# Patient Record
Sex: Female | Born: 1950
Health system: Southern US, Community
[De-identification: ages and names within clinical notes are randomized; demographics above are authoritative.]

## PROBLEM LIST (undated history)

## (undated) DIAGNOSIS — E079 Disorder of thyroid, unspecified: Secondary | ICD-10-CM

## (undated) DIAGNOSIS — I10 Essential (primary) hypertension: Secondary | ICD-10-CM

## (undated) DIAGNOSIS — R7303 Prediabetes: Secondary | ICD-10-CM

## (undated) DIAGNOSIS — R011 Cardiac murmur, unspecified: Secondary | ICD-10-CM

## (undated) DIAGNOSIS — M810 Age-related osteoporosis without current pathological fracture: Secondary | ICD-10-CM

## (undated) HISTORY — DX: Prediabetes: R73.03

## (undated) HISTORY — DX: Cardiac murmur, unspecified: R01.1

## (undated) HISTORY — PX: SMALL INTESTINE SURGERY: SHX150

## (undated) HISTORY — DX: Age-related osteoporosis without current pathological fracture: M81.0

## (undated) HISTORY — DX: Disorder of thyroid, unspecified: E07.9

---

## 1998-06-21 ENCOUNTER — Other Ambulatory Visit: Admission: RE | Admit: 1998-06-21 | Discharge: 1998-06-21 | Payer: Self-pay | Admitting: Obstetrics & Gynecology

## 1998-12-09 ENCOUNTER — Other Ambulatory Visit: Admission: RE | Admit: 1998-12-09 | Discharge: 1998-12-09 | Payer: Self-pay | Admitting: Obstetrics and Gynecology

## 2000-01-18 ENCOUNTER — Other Ambulatory Visit: Admission: RE | Admit: 2000-01-18 | Discharge: 2000-01-18 | Payer: Self-pay | Admitting: Obstetrics and Gynecology

## 2014-07-22 DIAGNOSIS — Z91199 Patient's noncompliance with other medical treatment and regimen due to unspecified reason: Secondary | ICD-10-CM | POA: Insufficient documentation

## 2017-05-17 DIAGNOSIS — Z6841 Body Mass Index (BMI) 40.0 and over, adult: Secondary | ICD-10-CM

## 2017-05-17 DIAGNOSIS — K8050 Calculus of bile duct without cholangitis or cholecystitis without obstruction: Principal | ICD-10-CM | POA: Diagnosis present

## 2017-05-17 DIAGNOSIS — R1011 Right upper quadrant pain: Secondary | ICD-10-CM | POA: Diagnosis not present

## 2017-05-17 DIAGNOSIS — Z7982 Long term (current) use of aspirin: Secondary | ICD-10-CM

## 2017-05-17 DIAGNOSIS — Z880 Allergy status to penicillin: Secondary | ICD-10-CM

## 2017-05-17 DIAGNOSIS — E785 Hyperlipidemia, unspecified: Secondary | ICD-10-CM | POA: Diagnosis present

## 2017-05-17 DIAGNOSIS — I10 Essential (primary) hypertension: Secondary | ICD-10-CM | POA: Diagnosis present

## 2017-05-18 ENCOUNTER — Observation Stay (HOSPITAL_COMMUNITY): Payer: Medicare Other | Admitting: Anesthesiology

## 2017-05-18 ENCOUNTER — Inpatient Hospital Stay (HOSPITAL_COMMUNITY)
Admission: EM | Admit: 2017-05-18 | Discharge: 2017-05-22 | DRG: 418 | Disposition: A | Discharge status: Home / Self Care | Payer: Medicare Other | Attending: General Surgery | Admitting: General Surgery

## 2017-05-18 ENCOUNTER — Encounter (HOSPITAL_COMMUNITY): Admission: EM | Disposition: A | Discharge status: Home / Self Care | Payer: Self-pay | Source: Home / Self Care

## 2017-05-18 ENCOUNTER — Other Ambulatory Visit: Payer: Self-pay

## 2017-05-18 ENCOUNTER — Encounter (HOSPITAL_COMMUNITY): Payer: Self-pay | Admitting: Emergency Medicine

## 2017-05-18 ENCOUNTER — Emergency Department (HOSPITAL_COMMUNITY): Payer: Medicare Other

## 2017-05-18 ENCOUNTER — Observation Stay (HOSPITAL_COMMUNITY): Payer: Medicare Other

## 2017-05-18 DIAGNOSIS — E782 Mixed hyperlipidemia: Secondary | ICD-10-CM | POA: Diagnosis present

## 2017-05-18 DIAGNOSIS — K8020 Calculus of gallbladder without cholecystitis without obstruction: Secondary | ICD-10-CM | POA: Diagnosis present

## 2017-05-18 DIAGNOSIS — K8050 Calculus of bile duct without cholangitis or cholecystitis without obstruction: Secondary | ICD-10-CM

## 2017-05-18 DIAGNOSIS — R1011 Right upper quadrant pain: Secondary | ICD-10-CM

## 2017-05-18 DIAGNOSIS — I10 Essential (primary) hypertension: Secondary | ICD-10-CM | POA: Diagnosis present

## 2017-05-18 DIAGNOSIS — E785 Hyperlipidemia, unspecified: Secondary | ICD-10-CM | POA: Diagnosis present

## 2017-05-18 DIAGNOSIS — K8051 Calculus of bile duct without cholangitis or cholecystitis with obstruction: Secondary | ICD-10-CM

## 2017-05-18 HISTORY — DX: Essential (primary) hypertension: I10

## 2017-05-18 HISTORY — PX: CHOLECYSTECTOMY: SHX55

## 2017-05-18 LAB — TROPONIN I: Troponin I: <0.03 ng/mL (ref <0.03)

## 2017-05-18 LAB — BASIC METABOLIC PANEL
Anion gap: 11 (ref 5–15)
BUN: 12 mg/dL (ref 6–20)
CO2: 21 mmol/L — ABNORMAL LOW (ref 22–32)
CREATININE: 0.78 mg/dL (ref 0.44–1.00)
Calcium: 9.4 mg/dL (ref 8.9–10.3)
Chloride: 103 mmol/L (ref 101–111)
GFR calc Af Amer: >60 mL/min (ref >60)
Glucose, Bld: 120 mg/dL — ABNORMAL HIGH (ref 65–99)
POTASSIUM: 4.3 mmol/L (ref 3.5–5.1)
SODIUM: 135 mmol/L (ref 135–145)

## 2017-05-18 LAB — CBC WITH DIFFERENTIAL/PLATELET
Basophils Absolute: 0 10*3/uL (ref 0.0–0.1)
Basophils Relative: 0 %
EOS ABS: 0 10*3/uL (ref 0.0–0.7)
EOS PCT: 0 %
HCT: 42.6 % (ref 36.0–46.0)
Hemoglobin: 14.7 g/dL (ref 12.0–15.0)
LYMPHS ABS: 1.7 10*3/uL (ref 0.7–4.0)
LYMPHS PCT: 15 %
MCH: 29.5 pg (ref 26.0–34.0)
MCHC: 34.5 g/dL (ref 30.0–36.0)
MCV: 85.5 fL (ref 78.0–100.0)
MONO ABS: 0.7 10*3/uL (ref 0.1–1.0)
MONOS PCT: 6 %
Neutro Abs: 9.1 10*3/uL — ABNORMAL HIGH (ref 1.7–7.7)
Neutrophils Relative %: 79 %
PLATELETS: 378 10*3/uL (ref 150–400)
RBC: 4.98 MIL/uL (ref 3.87–5.11)
RDW: 12.9 % (ref 11.5–15.5)
WBC: 11.5 10*3/uL — AB (ref 4.0–10.5)

## 2017-05-18 LAB — LIPASE, BLOOD: LIPASE: 26 U/L (ref 11–51)

## 2017-05-18 LAB — HEPATIC FUNCTION PANEL
ALT: 19 U/L (ref 14–54)
AST: 19 U/L (ref 15–41)
Albumin: 4.1 g/dL (ref 3.5–5.0)
Alkaline Phosphatase: 72 U/L (ref 38–126)
BILIRUBIN DIRECT: 0.2 mg/dL (ref 0.1–0.5)
BILIRUBIN TOTAL: 2 mg/dL — AB (ref 0.3–1.2)
Indirect Bilirubin: 1.8 mg/dL — ABNORMAL HIGH (ref 0.3–0.9)
Total Protein: 7.9 g/dL (ref 6.5–8.1)

## 2017-05-18 LAB — ETHANOL

## 2017-05-18 SURGERY — LAPAROSCOPIC CHOLECYSTECTOMY WITH INTRAOPERATIVE CHOLANGIOGRAM
Anesthesia: General | Site: Abdomen

## 2017-05-18 MED ORDER — EPHEDRINE 5 MG/ML INJ
INTRAVENOUS | Status: AC
  Filled 2017-05-18: qty 10

## 2017-05-18 MED ORDER — SPIRONOLACTONE 25 MG PO TABS: 25.0000 mg | ORAL_TABLET | Freq: Every day | ORAL | Status: DC

## 2017-05-18 MED ORDER — ONDANSETRON HCL 4 MG/2ML IJ SOLN
4.0000 mg | Freq: Four times a day (QID) | INTRAMUSCULAR | Status: DC | PRN
  Administered 2017-05-18: 4 mg via INTRAVENOUS

## 2017-05-18 MED ORDER — FENTANYL CITRATE (PF) 100 MCG/2ML IJ SOLN
50.0000 ug | Freq: Once | INTRAMUSCULAR | Status: AC
  Administered 2017-05-18: 50 ug via INTRAVENOUS
  Filled 2017-05-18: qty 2

## 2017-05-18 MED ORDER — ONDANSETRON HCL 4 MG/2ML IJ SOLN
2.0000 mg | INTRAMUSCULAR | Status: DC | PRN
  Administered 2017-05-18 – 2017-05-22 (×11): 4 mg via INTRAVENOUS
  Filled 2017-05-18 (×11): qty 2

## 2017-05-18 MED ORDER — DEXAMETHASONE SODIUM PHOSPHATE 10 MG/ML IJ SOLN
INTRAMUSCULAR | Status: DC | PRN
  Administered 2017-05-18: 10 mg via INTRAVENOUS

## 2017-05-18 MED ORDER — PROPOFOL 10 MG/ML IV BOLUS
INTRAVENOUS | Status: DC | PRN
  Administered 2017-05-18: 200 mg via INTRAVENOUS

## 2017-05-18 MED ORDER — HEPARIN SODIUM (PORCINE) 5000 UNIT/ML IJ SOLN: 5000.0000 [IU] | Freq: Three times a day (TID) | INTRAMUSCULAR | Status: DC

## 2017-05-18 MED ORDER — HYDROCODONE-ACETAMINOPHEN 5-325 MG PO TABS: 1.0000 | ORAL_TABLET | ORAL | Status: DC | PRN

## 2017-05-18 MED ORDER — BUPIVACAINE-EPINEPHRINE 0.5% -1:200000 IJ SOLN
INTRAMUSCULAR | Status: DC | PRN
  Administered 2017-05-18: 25 mL

## 2017-05-18 MED ORDER — LACTATED RINGERS IV SOLN
INTRAVENOUS | Status: DC | PRN
  Administered 2017-05-18 (×2): via INTRAVENOUS

## 2017-05-18 MED ORDER — ACETAMINOPHEN 325 MG PO TABS
650.0000 mg | ORAL_TABLET | Freq: Four times a day (QID) | ORAL | Status: DC | PRN
  Administered 2017-05-18: 650 mg via ORAL
  Filled 2017-05-18: qty 2

## 2017-05-18 MED ORDER — IOPAMIDOL (ISOVUE-300) INJECTION 61%
INTRAVENOUS | Status: AC
  Filled 2017-05-18: qty 50

## 2017-05-18 MED ORDER — MORPHINE SULFATE (PF) 2 MG/ML IV SOLN: 1.0000 mg | INTRAVENOUS | Status: DC | PRN

## 2017-05-18 MED ORDER — SODIUM CHLORIDE 0.9 % IV SOLN
2.0000 g | INTRAVENOUS | Status: DC
  Administered 2017-05-18: 2 g via INTRAVENOUS

## 2017-05-18 MED ORDER — AMLODIPINE BESYLATE 10 MG PO TABS: 10.0000 mg | ORAL_TABLET | Freq: Every day | ORAL | Status: DC

## 2017-05-18 MED ORDER — FENTANYL CITRATE (PF) 100 MCG/2ML IJ SOLN
INTRAMUSCULAR | Status: AC
  Filled 2017-05-18: qty 2

## 2017-05-18 MED ORDER — METOCLOPRAMIDE HCL 5 MG/ML IJ SOLN: 10.0000 mg | Freq: Once | INTRAMUSCULAR | Status: DC | PRN

## 2017-05-18 MED ORDER — KCL IN DEXTROSE-NACL 20-5-0.9 MEQ/L-%-% IV SOLN
INTRAVENOUS | Status: DC
  Administered 2017-05-18 – 2017-05-20 (×5): via INTRAVENOUS
  Administered 2017-05-21: 1000 mL via INTRAVENOUS
  Administered 2017-05-21 – 2017-05-22 (×2): via INTRAVENOUS
  Filled 2017-05-18 (×8): qty 1000

## 2017-05-18 MED ORDER — LACTATED RINGERS IR SOLN
Status: DC | PRN
  Administered 2017-05-18: 1000 mL

## 2017-05-18 MED ORDER — LIDOCAINE 2% (20 MG/ML) 5 ML SYRINGE
INTRAMUSCULAR | Status: DC | PRN
  Administered 2017-05-18: 80 mg via INTRAVENOUS

## 2017-05-18 MED ORDER — MORPHINE SULFATE (PF) 2 MG/ML IV SOLN
1.0000 mg | INTRAVENOUS | Status: DC | PRN
  Administered 2017-05-18 – 2017-05-19 (×3): 2 mg via INTRAVENOUS
  Administered 2017-05-19: 1 mg via INTRAVENOUS
  Administered 2017-05-19 – 2017-05-20 (×5): 2 mg via INTRAVENOUS
  Filled 2017-05-18 (×9): qty 1

## 2017-05-18 MED ORDER — EPHEDRINE SULFATE-NACL 50-0.9 MG/10ML-% IV SOSY
PREFILLED_SYRINGE | INTRAVENOUS | Status: DC | PRN
  Administered 2017-05-18 (×6): 5 mg via INTRAVENOUS

## 2017-05-18 MED ORDER — MIDAZOLAM HCL 2 MG/2ML IJ SOLN
INTRAMUSCULAR | Status: AC
  Filled 2017-05-18: qty 2

## 2017-05-18 MED ORDER — ONDANSETRON HCL 4 MG/2ML IJ SOLN
4.0000 mg | Freq: Once | INTRAMUSCULAR | Status: AC
  Administered 2017-05-18: 4 mg via INTRAVENOUS
  Filled 2017-05-18: qty 2

## 2017-05-18 MED ORDER — IOPAMIDOL (ISOVUE-300) INJECTION 61%
INTRAVENOUS | Status: DC | PRN
  Administered 2017-05-18: 35 mL via INTRAVENOUS

## 2017-05-18 MED ORDER — SODIUM CHLORIDE 0.9 % IV SOLN
INTRAVENOUS | Status: AC
  Filled 2017-05-18: qty 20

## 2017-05-18 MED ORDER — KCL IN DEXTROSE-NACL 20-5-0.9 MEQ/L-%-% IV SOLN: INTRAVENOUS | Status: DC

## 2017-05-18 MED ORDER — PIPERACILLIN-TAZOBACTAM 3.375 G IVPB 30 MIN
3.3750 g | Freq: Once | INTRAVENOUS | Status: AC
  Administered 2017-05-18: 3.375 g via INTRAVENOUS
  Filled 2017-05-18: qty 50

## 2017-05-18 MED ORDER — LIDOCAINE 2% (20 MG/ML) 5 ML SYRINGE
INTRAMUSCULAR | Status: AC
  Filled 2017-05-18: qty 10

## 2017-05-18 MED ORDER — MEPERIDINE HCL 50 MG/ML IJ SOLN: 6.2500 mg | INTRAMUSCULAR | Status: DC | PRN

## 2017-05-18 MED ORDER — FENTANYL CITRATE (PF) 100 MCG/2ML IJ SOLN: 25.0000 ug | INTRAMUSCULAR | Status: DC | PRN

## 2017-05-18 MED ORDER — SUGAMMADEX SODIUM 200 MG/2ML IV SOLN
INTRAVENOUS | Status: AC
  Filled 2017-05-18: qty 6

## 2017-05-18 MED ORDER — ROCURONIUM BROMIDE 10 MG/ML (PF) SYRINGE
PREFILLED_SYRINGE | INTRAVENOUS | Status: AC
  Filled 2017-05-18: qty 10

## 2017-05-18 MED ORDER — SUGAMMADEX SODIUM 200 MG/2ML IV SOLN
INTRAVENOUS | Status: DC | PRN
  Administered 2017-05-18: 200 mg via INTRAVENOUS

## 2017-05-18 MED ORDER — MIDAZOLAM HCL 5 MG/5ML IJ SOLN
INTRAMUSCULAR | Status: DC | PRN
  Administered 2017-05-18: 2 mg via INTRAVENOUS

## 2017-05-18 MED ORDER — BUPIVACAINE-EPINEPHRINE (PF) 0.5% -1:200000 IJ SOLN
INTRAMUSCULAR | Status: AC
  Filled 2017-05-18: qty 30

## 2017-05-18 MED ORDER — ROCURONIUM BROMIDE 10 MG/ML (PF) SYRINGE
PREFILLED_SYRINGE | INTRAVENOUS | Status: DC | PRN
  Administered 2017-05-18: 50 mg via INTRAVENOUS
  Administered 2017-05-18: 10 mg via INTRAVENOUS

## 2017-05-18 MED ORDER — FAMOTIDINE IN NACL 20-0.9 MG/50ML-% IV SOLN: 20.0000 mg | Freq: Two times a day (BID) | INTRAVENOUS | Status: DC

## 2017-05-18 MED ORDER — FENTANYL CITRATE (PF) 100 MCG/2ML IJ SOLN
INTRAMUSCULAR | Status: DC | PRN
  Administered 2017-05-18 (×4): 50 ug via INTRAVENOUS

## 2017-05-18 MED ORDER — PHENYLEPHRINE 40 MCG/ML (10ML) SYRINGE FOR IV PUSH (FOR BLOOD PRESSURE SUPPORT)
PREFILLED_SYRINGE | INTRAVENOUS | Status: AC
  Filled 2017-05-18: qty 10

## 2017-05-18 MED ORDER — ONDANSETRON 4 MG PO TBDP: 4.0000 mg | ORAL_TABLET | Freq: Four times a day (QID) | ORAL | Status: DC | PRN

## 2017-05-18 SURGICAL SUPPLY — 30 items
ADH SKN CLS APL DERMABOND .7 (GAUZE/BANDAGES/DRESSINGS) ×1
APPLIER CLIP 5 13 M/L LIGAMAX5 (MISCELLANEOUS) ×3
APR CLP MED LRG 5 ANG JAW (MISCELLANEOUS) ×1
BAG SPEC RTRVL LRG 6X4 10 (ENDOMECHANICALS) ×1
CABLE HIGH FREQUENCY MONO STRZ (ELECTRODE) ×3 IMPLANT
CATH REDDICK CHOLANGI 4FR 50CM (CATHETERS) ×3 IMPLANT
CHLORAPREP W/TINT 26ML (MISCELLANEOUS) ×3 IMPLANT
CLIP APPLIE 5 13 M/L LIGAMAX5 (MISCELLANEOUS) ×1 IMPLANT
COVER MAYO STAND STRL (DRAPES) ×3 IMPLANT
DECANTER SPIKE VIAL GLASS SM (MISCELLANEOUS) ×1 IMPLANT
DERMABOND ADVANCED (GAUZE/BANDAGES/DRESSINGS) ×2
DERMABOND ADVANCED .7 DNX12 (GAUZE/BANDAGES/DRESSINGS) ×1 IMPLANT
DRAPE C-ARM 42X120 X-RAY (DRAPES) ×3 IMPLANT
ELECT REM PT RETURN 15FT ADLT (MISCELLANEOUS) ×3 IMPLANT
GLOVE BIO SURGEON STRL SZ7.5 (GLOVE) ×3 IMPLANT
GOWN STRL REUS W/TWL XL LVL3 (GOWN DISPOSABLE) ×7 IMPLANT
HEMOSTAT SURGICEL 4X8 (HEMOSTASIS) IMPLANT
IV CATH 14GX2 1/4 (CATHETERS) ×3 IMPLANT
KIT BASIN OR (CUSTOM PROCEDURE TRAY) ×3 IMPLANT
POUCH SPECIMEN RETRIEVAL 10MM (ENDOMECHANICALS) ×3 IMPLANT
SCISSORS LAP 5X35 DISP (ENDOMECHANICALS) ×3 IMPLANT
SET IRRIG TUBING LAPAROSCOPIC (IRRIGATION / IRRIGATOR) ×3 IMPLANT
SLEEVE XCEL OPT CAN 5 100 (ENDOMECHANICALS) ×6 IMPLANT
SUT MNCRL AB 4-0 PS2 18 (SUTURE) ×3 IMPLANT
TOWEL OR 17X26 10 PK STRL BLUE (TOWEL DISPOSABLE) ×3 IMPLANT
TOWEL OR NON WOVEN STRL DISP B (DISPOSABLE) ×3 IMPLANT
TRAY LAPAROSCOPIC (CUSTOM PROCEDURE TRAY) ×3 IMPLANT
TROCAR BLADELESS OPT 5 100 (ENDOMECHANICALS) ×3 IMPLANT
TROCAR XCEL BLUNT TIP 100MML (ENDOMECHANICALS) ×3 IMPLANT
TUBING INSUF HEATED (TUBING) ×3 IMPLANT

## 2017-05-18 NOTE — ED Provider Notes (Signed)
[facility]-EMERGENCY DEPT Provider Note   CSN: [id number] Arrival date & time: 05/17/17  2358     History   Chief Complaint Chief Complaint  Patient presents with  . Abdominal Pain    HPI Rochele Raringlia Spallone is a 67 y.o. female.  The history is provided by the patient and the spouse.  Abdominal Pain   This is a new problem. The current episode started 12 to 24 hours ago. The problem occurs daily. The problem has been gradually worsening. The pain is associated with eating. The pain is located in the LUQ and RUQ. The pain is severe. Associated symptoms include nausea. Pertinent negatives include diarrhea, hematochezia, melena and vomiting. The symptoms are aggravated by certain positions. Nothing relieves the symptoms.   Patient reports onset of right upper and left upper quadrant abdominal pain in the past 24 hours.  It did seem to be worsened with eating.  No vomiting.  No fever.  No chest pain or shortness of breath.  She has never had this before.  Denies any abdominal surgery previously  PMH - HTN, HLP OB History   None      Home Medications    Prior to Admission medications   Medication Sig Start Date End Date Taking? Authorizing Provider  amLODipine (NORVASC) 10 MG tablet Take 10 mg by mouth daily. 05/10/17  Yes [provider]  aspirin EC 81 MG tablet Take 81 mg by mouth every other day. 08/25/16  Yes [provider]  rosuvastatin (CRESTOR) 10 MG tablet Take 10 mg by mouth daily. 05/10/17  Yes [provider]  spironolactone (ALDACTONE) 25 MG tablet Take 25 mg by mouth daily. 05/10/17  Yes [provider]  Vitamin D, Ergocalciferol, (DRISDOL) 50000 units CAPS capsule Take 500,000 Units by mouth once a week. 05/15/17  Yes [provider]    Family History No family history on file.  Social History Social History   Tobacco Use  . Smoking status: Never Smoker  . Smokeless tobacco: Never Used  Substance Use  Topics  . Alcohol use: Not on file  . Drug use: Not on file     Allergies   Patient has no known allergies.   Review of Systems Review of Systems  Respiratory: Negative for shortness of breath.   Cardiovascular: Negative for chest pain.  Gastrointestinal: Positive for abdominal pain and nausea. Negative for diarrhea, hematochezia, melena and vomiting.  All other systems reviewed and are negative.    Physical Exam Updated Vital Signs BP (!) 168/87 (BP Location: Left Arm)   Pulse 86   Temp 98.4 F (36.9 C) (Oral)   Resp 18   Ht 1.549 m (5\' 1" )   Wt 98.2 kg (216 lb 9.6 oz)   SpO2 98%   BMI 40.93 kg/m   Physical Exam  CONSTITUTIONAL: Well developed/well nourished HEAD: Normocephalic/atraumatic EYES: EOMI/PERRL, no icterus ENMT: Mucous membranes moist NECK: supple no meningeal signs SPINE/BACK:entire spine nontender CV: S1/S2 noted, no murmurs/rubs/gallops noted LUNGS: Lungs are clear to auscultation bilaterally, no apparent distress ABDOMEN: soft, mild tenderness throughout upper abdomen, no rebound or guarding, bowel sounds noted throughout abdomen GU:no cva tenderness NEURO: Pt is awake/alert/appropriate, moves all extremitiesx4.  No facial droop.   EXTREMITIES: pulses normal/equal, full ROM SKIN: warm, color normal PSYCH: no abnormalities of mood noted, alert and oriented to situation  ED Treatments / Results  Labs (all labs ordered are listed, but only abnormal results are displayed) Labs Reviewed  CBC WITH DIFFERENTIAL/PLATELET -  Abnormal; Notable for the following components:      Result Value   WBC 11.5 (*)    Neutro Abs 9.1 (*)    All other components within normal limits  BASIC METABOLIC PANEL - Abnormal; Notable for the following components:   CO2 21 (*)    Glucose, Bld 120 (*)    All other components within normal limits  HEPATIC FUNCTION PANEL - Abnormal; Notable for the following components:   Total Bilirubin 2.0 (*)    Indirect Bilirubin 1.8  (*)    All other components within normal limits  LIPASE, BLOOD  TROPONIN I  ETHANOL    EKG EKG Interpretation  Date/Time:  Friday May 18 2017 02:56:30 EDT Ventricular Rate:  80 PR Interval:    QRS Duration: 82 QT Interval:  355 QTC Calculation: 410 R Axis:   46 Text Interpretation:  Sinus rhythm Baseline wander in lead(s) V1 V2 No previous ECGs available Confirmed by Zadie Rhine ([id number]) on 05/18/2017 3:10:50 AM   Radiology US Abdomen Limited Ruq  Result Date: 05/18/2017 CLINICAL DATA:  Acute onset of right upper quadrant abdominal pain. EXAM: ULTRASOUND ABDOMEN LIMITED RIGHT UPPER QUADRANT COMPARISON:  None. FINDINGS: Gallbladder: Stones are noted dependently within the gallbladder. No significant gallbladder wall thickening is noted. Evaluation for ultrasonographic Murphy's sign is suboptimal given that the patient is on pain medication. No pericholecystic fluid is seen. Common bile duct: Diameter: 1.6 cm, raising concern for distal obstruction. No distal stone is identified on this study. Liver: No focal lesion identified. Diffusely increased parenchymal echogenicity and coarsened echotexture raises question for fatty infiltration. Portal vein is patent on color Doppler imaging with normal direction of blood flow towards the liver. IMPRESSION: 1. Cholelithiasis.  No definite evidence for cholecystitis. 2. Significant dilatation of the common bile duct to 1.6 cm, concerning for distal obstruction. No distal stone is characterized on this study. Would correlate with LFTs. 3. Diffuse fatty infiltration within the liver. Electronically Signed   By: Roanna Raider M.D.   On: 05/18/2017 04:08    Procedures Procedures  Medications Ordered in ED Medications  fentaNYL (SUBLIMAZE) injection 50 mcg (has no administration in time range)  piperacillin-tazobactam (ZOSYN) IVPB 3.375 g (has no administration in time range)  fentaNYL (SUBLIMAZE) injection 50 mcg (50 mcg Intravenous Given  05/18/17 0251)  ondansetron (ZOFRAN) injection 4 mg (4 mg Intravenous Given 05/18/17 0252)     Initial Impression / Assessment and Plan / ED Course  I have reviewed the triage vital signs and the nursing notes.  Pertinent labs results that were available during my care of the patient were reviewed by me and considered in my medical decision making (see chart for details).     Patient found to have cholelithiasis with possible stone in the CBD.  Her bilirubin is mildly elevated.  She is having return of pain.  She also has elevated white count.  Discussed with Dr. Ezzard Standing with general surgery.  Due to persistence of pain and ultrasound findings, she will be admitted for surgery.  He recommends IV antibiotics and to keep patient n.p.o.  This was discussed with patient/family and agreeable with plan   Final Clinical Impressions(s) / ED Diagnoses   Final diagnoses:  RUQ pain  Biliary colic  Calculus of bile duct without cholecystitis with obstruction    ED Discharge Orders    None       Zadie Rhine, MD 05/18/17 [phone]

## 2017-05-18 NOTE — H&P (Signed)
Donna [name] is an 67 y.o. female.   Chief Complaint: abdominal pain HPI: The patient is a 67 year old white female who presents with abdominal pain in the RUQ that started 3 days ago. Pain was associated with nausea and vomiting. Denies fever. She felt fine yesterday then the pain came back today. She came to ER and u/s shows gallstones.  History reviewed. No pertinent past medical history.  History reviewed. No pertinent surgical history.  No family history on file. Social History:  reports that she has never smoked. She has never used smokeless tobacco. Her alcohol and drug histories are not on file.  Allergies: No Known Allergies   (Not in a hospital admission)  Results for orders placed or performed during the hospital encounter of 05/18/17 (from the past 48 hour(s))  CBC with Differential     Status: Abnormal   Collection Time: 05/18/17  1:36 AM  Result Value Ref Range   WBC 11.5 (H) 4.0 - 10.5 K/uL   RBC 4.98 3.87 - 5.11 MIL/uL   Hemoglobin 14.7 12.0 - 15.0 g/dL   HCT 42.6 36.0 - 46.0 %   MCV 85.5 78.0 - 100.0 fL   MCH 29.5 26.0 - 34.0 pg   MCHC 34.5 30.0 - 36.0 g/dL   RDW 12.9 11.5 - 15.5 %   Platelets 378 150 - 400 K/uL   Neutrophils Relative % 79 %   Neutro Abs 9.1 (H) 1.7 - 7.7 K/uL   Lymphocytes Relative 15 %   Lymphs Abs 1.7 0.7 - 4.0 K/uL   Monocytes Relative 6 %   Monocytes Absolute 0.7 0.1 - 1.0 K/uL   Eosinophils Relative 0 %   Eosinophils Absolute 0.0 0.0 - 0.7 K/uL   Basophils Relative 0 %   Basophils Absolute 0.0 0.0 - 0.1 K/uL    Comment: Performed at [facility], [place] [address], [place], [place] [address]  Basic metabolic panel     Status: Abnormal   Collection Time: 05/18/17  1:36 AM  Result Value Ref Range   Sodium 135 135 - 145 mmol/L   Potassium 4.3 3.5 - 5.1 mmol/L   Chloride 103 101 - 111 mmol/L   CO2 21 (L) 22 - 32 mmol/L   Glucose, Bld 120 (H) 65 - 99 mg/dL   BUN 12 6 - 20 mg/dL   Creatinine, Ser 0.78 0.44 - 1.00 mg/dL    Calcium 9.4 8.9 - 10.3 mg/dL   GFR calc non Af Amer >60 >60 mL/min   GFR calc Af Amer >60 >60 mL/min    Comment: (NOTE) The eGFR has been calculated using the CKD EPI equation. This calculation has not been validated in all clinical situations. eGFR's persistently <60 mL/min signify possible Chronic Kidney Disease.    Anion gap 11 5 - 15    Comment: Performed at [facility], [place] [address], [place], [place] [address]  Lipase, blood     Status: None   Collection Time: 05/18/17  1:36 AM  Result Value Ref Range   Lipase 26 11 - 51 U/L    Comment: Performed at [facility], [place] [address], [place], [place] [address]  Hepatic function panel     Status: Abnormal   Collection Time: 05/18/17  3:05 AM  Result Value Ref Range   Total Protein 7.9 6.5 - 8.1 g/dL   Albumin 4.1 3.5 - 5.0 g/dL   AST 19 15 - 41 U/L   ALT 19 14 -  54 U/L   Alkaline Phosphatase 72 38 - 126 U/L   Total Bilirubin 2.0 (H) 0.3 - 1.2 mg/dL   Bilirubin, Direct 0.2 0.1 - 0.5 mg/dL   Indirect Bilirubin 1.8 (H) 0.3 - 0.9 mg/dL    Comment: Performed at [facility], [place] [address] [place], [place] [address]  Troponin I     Status: None   Collection Time: 05/18/17  3:05 AM  Result Value Ref Range   Troponin I <0.03 <0.03 ng/mL    Comment: Performed at [facility], [place] [address], [place], [place] [address]  Ethanol     Status: None   Collection Time: 05/18/17  3:05 AM  Result Value Ref Range   Alcohol, Ethyl (B) <10 <10 mg/dL    Comment:        LOWEST DETECTABLE LIMIT FOR SERUM ALCOHOL IS 10 mg/dL FOR MEDICAL PURPOSES ONLY Performed at [place] [address] [place], [place] [address]    US Abdomen Limited Ruq  Result Date: 05/18/2017 CLINICAL DATA:  Acute onset of right upper quadrant abdominal pain. EXAM: ULTRASOUND ABDOMEN LIMITED RIGHT UPPER QUADRANT COMPARISON:  None. FINDINGS: Gallbladder: Stones are  noted dependently within the gallbladder. No significant gallbladder wall thickening is noted. Evaluation for ultrasonographic Murphy's sign is suboptimal given that the patient is on pain medication. No pericholecystic fluid is seen. Common bile duct: Diameter: 1.6 cm, raising concern for distal obstruction. No distal stone is identified on this study. Liver: No focal lesion identified. Diffusely increased parenchymal echogenicity and coarsened echotexture raises question for fatty infiltration. Portal vein is patent on color Doppler imaging with normal direction of blood flow towards the liver. IMPRESSION: 1. Cholelithiasis.  No definite evidence for cholecystitis. 2. Significant dilatation of the common bile duct to 1.6 cm, concerning for distal obstruction. No distal stone is characterized on this study. Would correlate with LFTs. 3. Diffuse fatty infiltration within the liver. Electronically Signed   By: Garald Balding M.D.   On: 05/18/2017 04:08    Review of Systems  Constitutional: Negative.   HENT: Negative.   Eyes: Negative.   Respiratory: Negative.   Cardiovascular: Negative.   Gastrointestinal: Positive for abdominal pain, nausea and vomiting.  Genitourinary: Negative.   Musculoskeletal: Negative.   Skin: Negative.   Neurological: Negative.   Endo/Heme/Allergies: Negative.   Psychiatric/Behavioral: Negative.     Blood pressure (!) 141/83, pulse 66, temperature 98.4 F (36.9 C), temperature source Oral, resp. rate 20, height _0  (1.549 m), weight 98.2 kg (216 lb 9.6 oz), SpO2 99 %. Physical Exam  Constitutional: She is oriented to person, place, and time. She appears well-developed and well-nourished. No distress.  HENT:  Head: Normocephalic and atraumatic.  Mouth/Throat: No oropharyngeal exudate.  Eyes: Pupils are equal, round, and reactive to light. Conjunctivae and EOM are normal.  Neck: Normal range of motion. Neck supple.  Cardiovascular: Normal rate, regular rhythm and  normal heart sounds.  Respiratory: Effort normal and breath sounds normal. No stridor. No respiratory distress.  GI: Soft. Bowel sounds are normal. There is tenderness.  Musculoskeletal: Normal range of motion. She exhibits no edema or tenderness.  Neurological: She is alert and oriented to person, place, and time. Coordination normal.  Skin: Skin is warm and dry. No rash noted.  Psychiatric: She has a normal mood and affect. Her behavior is normal. Thought content normal.     Assessment/Plan The patient appears to have symptomatic gallstones. Because of the risk of pancreatitis  and further pain I think she would benefit from having her gallbladder removed. I have discussed with her the risks and benefits of the surgery as well as some of the technical aspects and she understands and wishes to proceed  Merrie Roof, MD 05/18/2017, 8:31 AM

## 2017-05-18 NOTE — ED Notes (Signed)
Pt aware urine sample is needed. Unable at this time

## 2017-05-18 NOTE — Anesthesia Postprocedure Evaluation (Signed)
Anesthesia Post Note  Patient: Rochele Raringlia Tijerina  Procedure(s) Performed: LAPAROSCOPIC CHOLECYSTECTOMY WITH INTRAOPERATIVE CHOLANGIOGRAM (N/A Abdomen)     Patient location during evaluation: PACU Anesthesia Type: General Level of consciousness: awake and alert Pain management: pain level controlled Vital Signs Assessment: post-procedure vital signs reviewed and stable Respiratory status: spontaneous breathing, nonlabored ventilation, respiratory function stable and patient connected to nasal cannula oxygen Cardiovascular status: blood pressure returned to baseline and stable Postop Assessment: no apparent nausea or vomiting Anesthetic complications: no    Last Vitals:  Vitals:   05/18/17 1547 05/18/17 1701  BP: (!) 144/74 (!) 146/77  Pulse: 80 76  Resp: 16 16  Temp: 36.8 C 36.8 C  SpO2: 94% 93%    Last Pain:  Vitals:   05/18/17 1701  TempSrc: Oral  PainSc:                  Phillips Groutarignan, Preesha Benjamin

## 2017-05-18 NOTE — Anesthesia Procedure Notes (Signed)
Procedure Name: Intubation Date/Time: 05/18/2017 11:53 AM Performed by: Lavina Hamman, CRNA Pre-anesthesia Checklist: Patient identified, Emergency Drugs available, Suction available, Patient being monitored and Timeout performed Patient Re-evaluated:Patient Re-evaluated prior to induction Oxygen Delivery Method: Circle system utilized Preoxygenation: Pre-oxygenation with 100% oxygen Induction Type: IV induction Ventilation: Mask ventilation without difficulty Laryngoscope Size: Mac and 4 Grade View: Grade II Tube type: Oral Tube size: 7.5 mm Number of attempts: 1 Airway Equipment and Method: Stylet Placement Confirmation: ETT inserted through vocal cords under direct vision,  positive ETCO2,  CO2 detector and breath sounds checked- equal and bilateral Secured at: 21 cm Tube secured with: Tape Dental Injury: Teeth and Oropharynx as per pre-operative assessment

## 2017-05-18 NOTE — Transfer of Care (Signed)
Immediate Anesthesia Transfer of Care Note  Patient: Donna [name]  Procedure(s) Performed: LAPAROSCOPIC CHOLECYSTECTOMY WITH INTRAOPERATIVE CHOLANGIOGRAM (N/A Abdomen)  Patient Location: PACU  Anesthesia Type:General  Level of Consciousness: awake, alert  and oriented  Airway & Oxygen Therapy: Patient Spontanous Breathing and Patient connected to face mask oxygen  Post-op Assessment: Report given to RN  Post vital signs: Reviewed and stable  Last Vitals:  Vitals Value Taken Time  BP 131/73 05/18/2017  1:38 PM  Temp    Pulse 90 05/18/2017  1:40 PM  Resp 23 05/18/2017  1:40 PM  SpO2 96 % 05/18/2017  1:40 PM  Vitals shown include unvalidated device data.  Last Pain:  Vitals:   05/18/17 0958  TempSrc:   PainSc: 0-No pain         Complications: No apparent anesthesia complications

## 2017-05-18 NOTE — Op Note (Signed)
05/18/2017  1:49 PM  PATIENT:  Donna [name]  67 y.o. female  PRE-OPERATIVE DIAGNOSIS:  gallstones  POST-OPERATIVE DIAGNOSIS:  gallstones  PROCEDURE:  Procedure(s): LAPAROSCOPIC CHOLECYSTECTOMY WITH INTRAOPERATIVE CHOLANGIOGRAM (N/A)  SURGEON:  Surgeon(s) and Role:    * Griselda Miner, MD - Primary    * Kinsinger, De Blanch, MD - Assisting  PHYSICIAN ASSISTANT:   ASSISTANTS: Dr. Sheliah Hatch   ANESTHESIA:   local and general  EBL:  20 mL   BLOOD ADMINISTERED:none  DRAINS: none   LOCAL MEDICATIONS USED:  MARCAINE     SPECIMEN:  Source of Specimen:  gallbladder  DISPOSITION OF SPECIMEN:  PATHOLOGY  COUNTS:  YES  TOURNIQUET:  * No tourniquets in log *  DICTATION: .Dragon Dictation   Procedure: After informed consent was obtained the patient was brought to the operating room and placed in the supine position on the operating room table. After adequate induction of general anesthesia the patient's abdomen was prepped with ChloraPrep allowed to dry and draped in usual sterile manner. The area below the umbilicus was infiltrated with quarter percent  Marcaine. A small incision was made with a 15 blade knife. The incision was carried down through the subcutaneous tissue bluntly with a hemostat and [employer] retractors. The linea alba was identified. The linea alba was incised with a 15 blade knife and each side was grasped with Coker clamps. The preperitoneal space was then probed with a hemostat until the peritoneum was opened and access was gained to the abdominal cavity. A 0 Vicryl pursestring stitch was placed in the fascia surrounding the opening. A Hassan cannula was then placed through the opening and anchored in place with the previously placed Vicryl purse string stitch. The abdomen was insufflated with carbon dioxide without difficulty. A laparoscope was inserted through the [facility] cannula in the right upper quadrant was inspected. Next the epigastric region was infiltrated with  % Marcaine. A small incision was made with a 15 blade knife. A 5 mm port was placed bluntly through this incision into the abdominal cavity under direct vision. Next 2 sites were chosen laterally on the right side of the abdomen for placement of 5 mm ports. Each of these areas was infiltrated with quarter percent Marcaine. Small stab incisions were made with a 15 blade knife. 5 mm ports were then placed bluntly through these incisions into the abdominal cavity under direct vision without difficulty. A blunt grasper was placed through the lateralmost 5 mm port and used to grasp the dome of the gallbladder and elevated anteriorly and superiorly. Another blunt grasper was placed through the other 5 mm port and used to retract the body and neck of the gallbladder. A dissector was placed through the epigastric port and using the electrocautery the peritoneal reflection at the gallbladder neck was opened. Blunt dissection was then carried out in this area until the gallbladder neck-cystic duct junction was readily identified and a good window was created. A single clip was placed on the gallbladder neck. A small  ductotomy was made just below the clip with laparoscopic scissors. A 14-gauge Angiocath was then placed through the anterior abdominal wall under direct vision. A Reddick cholangiogram catheter was then placed through the Angiocath and flushed. The catheter was then placed in the cystic duct and anchored in place with a clip. A cholangiogram was obtained that showed a very dilated common bile duct with no emptying into the duodenum.  We did feel that we were at the junction of the cystic  duct and gallbladder neck.  the anchoring clip and catheters were then removed from the patient. 3 clips were placed proximally on the cystic duct and the duct was divided between the 2 sets of clips. Posterior to this the cystic artery was identified and again dissected bluntly in a circumferential manner until a good window   was created. 2 clips were placed proximally and one distally on the artery and the artery was divided between the 2 sets of clips. Next a laparoscopic hook cautery device was used to separate the gallbladder from the liver bed. Prior to completely detaching the gallbladder from the liver bed the liver bed was inspected and several small bleeding points were coagulated with the electrocautery until the area was completely hemostatic. The gallbladder was then detached the rest of it from the liver bed without difficulty. A laparoscopic bag was inserted through the hassan port. The laparoscope was moved to the epigastric port. The gallbladder was placed within the bag and the bag was sealed.  The bag with the gallbladder was then removed with the [facility] cannula through the infraumbilical port without difficulty. The fascial defect was then closed with the previously placed Vicryl pursestring stitch as well as with another figure-of-eight 0 Vicryl stitch. The liver bed was inspected again and found to be hemostatic. The abdomen was irrigated with copious amounts of saline until the effluent was clear. The ports were then removed under direct vision without difficulty and were found to be hemostatic. The gas was allowed to escape. The skin incisions were all closed with interrupted 4-0 Monocryl subcuticular stitches. Dermabond dressings were applied. The patient tolerated the procedure well. At the end of the case all needle sponge and instrument counts were correct. The patient was then awakened and taken to recovery in stable condition  PLAN OF CARE: Admit to inpatient   PATIENT DISPOSITION:  PACU - hemodynamically stable.   Delay start of Pharmacological VTE agent (>24hrs) due to surgical blood loss or risk of bleeding: no

## 2017-05-18 NOTE — Anesthesia Preprocedure Evaluation (Signed)
Anesthesia Evaluation  Patient identified by MRN, date of birth, ID band Patient awake    Reviewed: Allergy & Precautions, NPO status , Patient's Chart, lab work & pertinent test results  Airway Mallampati: II  TM Distance: >3 FB Neck ROM: Full    Dental no notable dental hx.    Pulmonary neg pulmonary ROS,    Pulmonary exam normal breath sounds clear to auscultation       Cardiovascular hypertension, Pt. on medications Normal cardiovascular exam Rhythm:Regular Rate:Normal     Neuro/Psych negative neurological ROS  negative psych ROS   GI/Hepatic negative GI ROS, Neg liver ROS,   Endo/Other  Morbid obesity  Renal/GU negative Renal ROS  negative genitourinary   Musculoskeletal negative musculoskeletal ROS (+)   Abdominal   Peds negative pediatric ROS (+)  Hematology negative hematology ROS (+)   Anesthesia Other Findings   Reproductive/Obstetrics negative OB ROS                             Anesthesia Physical Anesthesia Plan  ASA: III  Anesthesia Plan: General   Post-op Pain Management:    Induction: Intravenous  PONV Risk Score and Plan: 4 or greater and Ondansetron, Dexamethasone, Midazolam, Scopolamine patch - Pre-op and Treatment may vary due to age or medical condition  Airway Management Planned: Oral ETT  Additional Equipment:   Intra-op Plan:   Post-operative Plan: Extubation in OR  Informed Consent: I have reviewed the patients History and Physical, chart, labs and discussed the procedure including the risks, benefits and alternatives for the proposed anesthesia with the patient or authorized representative who has indicated his/her understanding and acceptance.   Dental advisory given  Plan Discussed with: CRNA  Anesthesia Plan Comments:         Anesthesia Quick Evaluation

## 2017-05-18 NOTE — ED Triage Notes (Signed)
Pt arriving with severe right sided abdominal pain and nausea x2 days. Pt reports she has not been able to eat for the last 2 days. Pt denies taking anything for pain.

## 2017-05-18 NOTE — Interval H&P Note (Signed)
History and Physical Interval Note:  05/18/2017 11:15 AM  Donna RaringElia Nodine  has presented today for surgery, with the diagnosis of gallstones  The various methods of treatment have been discussed with the patient and family. After consideration of risks, benefits and other options for treatment, the patient has consented to  Procedure(s): LAPAROSCOPIC CHOLECYSTECTOMY WITH INTRAOPERATIVE CHOLANGIOGRAM (N/A) as a surgical intervention .  The patient's history has been reviewed, patient examined, no change in status, stable for surgery.  I have reviewed the patient's chart and labs.  Questions were answered to the patient's satisfaction.     TOTH III,PAUL S

## 2017-05-19 ENCOUNTER — Encounter (HOSPITAL_COMMUNITY): Payer: Self-pay | Admitting: Gastroenterology

## 2017-05-19 LAB — COMPREHENSIVE METABOLIC PANEL
ALBUMIN: 3.5 g/dL (ref 3.5–5.0)
ALK PHOS: 86 U/L (ref 38–126)
ALT: 89 U/L — ABNORMAL HIGH (ref 14–54)
ALT: 99 U/L — ABNORMAL HIGH (ref 14–54)
ANION GAP: 9 (ref 5–15)
AST: 96 U/L — ABNORMAL HIGH (ref 15–41)
AST: 98 U/L — AB (ref 15–41)
Albumin: 3.5 g/dL (ref 3.5–5.0)
Alkaline Phosphatase: 89 U/L (ref 38–126)
Anion gap: 10 (ref 5–15)
BILIRUBIN TOTAL: 1.8 mg/dL — AB (ref 0.3–1.2)
BUN: 12 mg/dL (ref 6–20)
BUN: 11 mg/dL (ref 6–20)
CALCIUM: 8.5 mg/dL — AB (ref 8.9–10.3)
CHLORIDE: 108 mmol/L (ref 101–111)
CO2: 24 mmol/L (ref 22–32)
CO2: 24 mmol/L (ref 22–32)
Calcium: 8.8 mg/dL — ABNORMAL LOW (ref 8.9–10.3)
Chloride: 106 mmol/L (ref 101–111)
Creatinine, Ser: 0.85 mg/dL (ref 0.44–1.00)
Creatinine, Ser: 0.88 mg/dL (ref 0.44–1.00)
GFR calc Af Amer: >60 mL/min (ref >60)
GFR calc non Af Amer: >60 mL/min (ref >60)
GFR calc non Af Amer: >60 mL/min (ref >60)
GLUCOSE: 121 mg/dL — AB (ref 65–99)
Glucose, Bld: 143 mg/dL — ABNORMAL HIGH (ref 65–99)
POTASSIUM: 4.4 mmol/L (ref 3.5–5.1)
Potassium: 3.8 mmol/L (ref 3.5–5.1)
SODIUM: 141 mmol/L (ref 135–145)
SODIUM: 140 mmol/L (ref 135–145)
TOTAL PROTEIN: 6.7 g/dL (ref 6.5–8.1)
Total Bilirubin: 1.9 mg/dL — ABNORMAL HIGH (ref 0.3–1.2)
Total Protein: 6.7 g/dL (ref 6.5–8.1)

## 2017-05-19 NOTE — Progress Notes (Signed)
1 Day Post-Op   Subjective/Chief Complaint: Feels some soreness today   Objective: Vital signs in last 24 hours: Temp:  [98.1 F (36.7 C)-99.3 F (37.4 C)] 99.2 F (37.3 C) (04/20 0524) Pulse Rate:  [72-93] 74 (04/20 0524) Resp:  [14-21] 15 (04/20 0524) BP: (104-152)/(68-77) 152/77 (04/20 0524) SpO2:  [93 %-97 %] 97 % (04/20 0524) Weight:  [97.5 kg (215 lb)] 97.5 kg (215 lb) (04/19 0958) Last BM Date: 05/16/17  Intake/Output from previous day: 04/19 0701 - 04/20 0700 In: 2021.7 [I.V.:1971.7; IV Piggyback:50] Out: 20 [Blood:20] Intake/Output this shift: No intake/output data recorded.  General appearance: alert and cooperative Resp: clear to auscultation bilaterally Cardio: regular rate and rhythm GI: soft, appropriately tender  Lab Results:  Recent Labs    05/18/17 0136  WBC 11.5*  HGB 14.7  HCT 42.6  PLT 378   BMET Recent Labs    05/18/17 0136 05/19/17 0401  NA 135 141  K 4.3 3.8  CL 103 108  CO2 21* 24  GLUCOSE 120* 143*  BUN 12 12  CREATININE 0.78 0.85  CALCIUM 9.4 8.5*   PT/INR No results for input(s): LABPROT, INR in the last 72 hours. ABG No results for input(s): PHART, HCO3 in the last 72 hours.  Invalid input(s): PCO2, PO2  Studies/Results: Dg Cholangiogram Operative  Result Date: 05/18/2017 CLINICAL DATA:  Intraoperative cholangiogram during laparoscopic cholecystectomy. EXAM: INTRAOPERATIVE CHOLANGIOGRAM FLUOROSCOPY TIME:  47 seconds COMPARISON:  Right upper quadrant abdominal ultrasound - 05/18/2017 FINDINGS: Intraoperative cholangiographic images of the right upper abdominal quadrant during laparoscopic cholecystectomy are provided for review. Surgical clips overlie the expected location of the gallbladder fossa. There are several ill-defined filling defects within the opacified portion of the neck of the gallbladder compatible with known cholelithiasis. Contrast injection demonstrates selective cannulation of the central aspect of the  cystic duct. There is passage of contrast through the central aspect of the cystic duct with filling of a dilated common bile duct. There is no definitive passage of contrast to the level of the duodenum. There is minimal reflux of injected contrast into the common hepatic duct and central aspect of the mildly dilated intrahepatic biliary system. While there are no discrete filling defects within the opacified portions of the biliary system to suggest the presence of choledocholithiasis, however again, there is no definitive opacification of the duodenum. IMPRESSION: Cholelithiasis without evidence of choledocholithiasis, however there is no definitive passage of contrast into the duodenum. Correlation with the operative report is recommended. Further evaluation with ERCP could be performed as indicated. Electronically Signed   By: Simonne Come M.D.   On: 05/18/2017 14:01   US Abdomen Limited Ruq  Result Date: 05/18/2017 CLINICAL DATA:  Acute onset of right upper quadrant abdominal pain. EXAM: ULTRASOUND ABDOMEN LIMITED RIGHT UPPER QUADRANT COMPARISON:  None. FINDINGS: Gallbladder: Stones are noted dependently within the gallbladder. No significant gallbladder wall thickening is noted. Evaluation for ultrasonographic Murphy's sign is suboptimal given that the patient is on pain medication. No pericholecystic fluid is seen. Common bile duct: Diameter: 1.6 cm, raising concern for distal obstruction. No distal stone is identified on this study. Liver: No focal lesion identified. Diffusely increased parenchymal echogenicity and coarsened echotexture raises question for fatty infiltration. Portal vein is patent on color Doppler imaging with normal direction of blood flow towards the liver. IMPRESSION: 1. Cholelithiasis.  No definite evidence for cholecystitis. 2. Significant dilatation of the common bile duct to 1.6 cm, concerning for distal obstruction. No distal stone is characterized on  this study. Would correlate  with LFTs. 3. Diffuse fatty infiltration within the liver. Electronically Signed   By: Roanna RaiderJeffery  Chang M.D.   On: 05/18/2017 04:08    Anti-infectives: Anti-infectives (From admission, onward)   Start     Dose/Rate Route Frequency Ordered Stop   05/18/17 1137  sodium chloride 0.9 % with cefTRIAXone (ROCEPHIN) ADS Med    Note to Pharmacy:  Curlene DolphinWhitlow, Cheryl   : cabinet override      05/18/17 1137 05/18/17 1156   05/18/17 1000  cefTRIAXone (ROCEPHIN) 2 g in sodium chloride 0.9 % 100 mL IVPB  Status:  Discontinued     2 g 200 mL/hr over 30 Minutes Intravenous Every 24 hours 05/18/17 0957 05/18/17 1433   05/18/17 0545  piperacillin-tazobactam (ZOSYN) IVPB 3.375 g     3.375 g 100 mL/hr over 30 Minutes Intravenous  Once 05/18/17 0531 05/18/17 0743      Assessment/Plan: s/p Procedure(s): LAPAROSCOPIC CHOLECYSTECTOMY WITH INTRAOPERATIVE CHOLANGIOGRAM (N/A) npo  GI consult for consideration of ERCP since there was no filling on cholangiogram POD 1  LOS: 0 days    TOTH III,PAUL S 05/19/2017

## 2017-05-19 NOTE — Consult Note (Signed)
Reason for Consult:abnormal Intra-Op cholangiogram Referring Physician: surgical team  Donna [name] is an 67 y.o. female.  HPI: patient seen and examined and her hospital computer chart reviewed and case discussed with the surgical team and her Intra-Op cholangiogram reviewed and currently she is doing fine postop with some soreness and is different than the pain brought her to the hospital and she did have a screening colonoscopy in [place] a few years ago but no other GI issues nd her family history is negative for any gallbladder problems or GI issues  History reviewed. No pertinent past medical history.  Past Surgical History:  Procedure Laterality Date  . CHOLECYSTECTOMY N/A 05/18/2017   Procedure: LAPAROSCOPIC CHOLECYSTECTOMY WITH INTRAOPERATIVE CHOLANGIOGRAM;  Surgeon: Jovita Kussmaul, MD;  Location: [facility];  Service: General;  Laterality: N/A;    History reviewed. No pertinent family history.  Social History:  reports that she has never smoked. She has never used smokeless tobacco. Her alcohol and drug histories are not on file.  Allergies:  Allergies  Allergen Reactions  . Penicillins     Unsure of the reaction- happened around age 60    Medications: I have reviewed the patient's current medications.  Results for orders placed or performed during the hospital encounter of 05/18/17 (from the past 48 hour(s))  CBC with Differential     Status: Abnormal   Collection Time: 05/18/17  1:36 AM  Result Value Ref Range   WBC 11.5 (H) 4.0 - 10.5 K/uL   RBC 4.98 3.87 - 5.11 MIL/uL   Hemoglobin 14.7 12.0 - 15.0 g/dL   HCT 42.6 36.0 - 46.0 %   MCV 85.5 78.0 - 100.0 fL   MCH 29.5 26.0 - 34.0 pg   MCHC 34.5 30.0 - 36.0 g/dL   RDW 12.9 11.5 - 15.5 %   Platelets 378 150 - 400 K/uL   Neutrophils Relative % 79 %   Neutro Abs 9.1 (H) 1.7 - 7.7 K/uL   Lymphocytes Relative 15 %   Lymphs Abs 1.7 0.7 - 4.0 K/uL   Monocytes Relative 6 %   Monocytes Absolute 0.7 0.1 - 1.0 K/uL    Eosinophils Relative 0 %   Eosinophils Absolute 0.0 0.0 - 0.7 K/uL   Basophils Relative 0 %   Basophils Absolute 0.0 0.0 - 0.1 K/uL    Comment: Performed at [facility], [place] [address] [place], [place] [address]  Basic metabolic panel     Status: Abnormal   Collection Time: 05/18/17  1:36 AM  Result Value Ref Range   Sodium 135 135 - 145 mmol/L   Potassium 4.3 3.5 - 5.1 mmol/L   Chloride 103 101 - 111 mmol/L   CO2 21 (L) 22 - 32 mmol/L   Glucose, Bld 120 (H) 65 - 99 mg/dL   BUN 12 6 - 20 mg/dL   Creatinine, Ser 0.78 0.44 - 1.00 mg/dL   Calcium 9.4 8.9 - 10.3 mg/dL   GFR calc non Af Amer >60 >60 mL/min   GFR calc Af Amer >60 >60 mL/min    Comment: (NOTE) The eGFR has been calculated using the CKD EPI equation. This calculation has not been validated in all clinical situations. eGFR's persistently <60 mL/min signify possible Chronic Kidney Disease.    Anion gap 11 5 - 15    Comment: Performed at [facility], [place] [address], [place], [place] [address]  Lipase, blood     Status: None   Collection Time: 05/18/17  1:36 AM  Result Value Ref Range   Lipase 26 11 - 51 U/L    Comment: Performed at [facility], [place] [address] [place], [place] [address]  Hepatic function panel     Status: Abnormal   Collection Time: 05/18/17  3:05 AM  Result Value Ref Range   Total Protein 7.9 6.5 - 8.1 g/dL   Albumin 4.1 3.5 - 5.0 g/dL   AST 19 15 - 41 U/L   ALT 19 14 - 54 U/L   Alkaline Phosphatase 72 38 - 126 U/L   Total Bilirubin 2.0 (H) 0.3 - 1.2 mg/dL   Bilirubin, Direct 0.2 0.1 - 0.5 mg/dL   Indirect Bilirubin 1.8 (H) 0.3 - 0.9 mg/dL    Comment: Performed at [facility], [place] [address] [place], [place] [address]  Troponin I     Status: None   Collection Time: 05/18/17  3:05 AM  Result Value Ref Range   Troponin I <0.03 <0.03 ng/mL    Comment: Performed at [facility], [place] [address],  [place], [place] [address]  Ethanol     Status: None   Collection Time: 05/18/17  3:05 AM  Result Value Ref Range   Alcohol, Ethyl (B) <10 <10 mg/dL    Comment:        LOWEST DETECTABLE LIMIT FOR SERUM ALCOHOL IS 10 mg/dL FOR MEDICAL PURPOSES ONLY Performed at [place] [address] [place], [place] [address]   Comprehensive metabolic panel     Status: Abnormal   Collection Time: 05/19/17  4:01 AM  Result Value Ref Range   Sodium 141 135 - 145 mmol/L   Potassium 3.8 3.5 - 5.1 mmol/L   Chloride 108 101 - 111 mmol/L   CO2 24 22 - 32 mmol/L   Glucose, Bld 143 (H) 65 - 99 mg/dL   BUN 12 6 - 20 mg/dL   Creatinine, Ser 0.85 0.44 - 1.00 mg/dL   Calcium 8.5 (L) 8.9 - 10.3 mg/dL   Total Protein 6.7 6.5 - 8.1 g/dL   Albumin 3.5 3.5 - 5.0 g/dL   AST 96 (H) 15 - 41 U/L   ALT 89 (H) 14 - 54 U/L   Alkaline Phosphatase 86 38 - 126 U/L   Total Bilirubin 1.9 (H) 0.3 - 1.2 mg/dL   GFR calc non Af Amer >60 >60 mL/min   GFR calc Af Amer >60 >60 mL/min    Comment: (NOTE) The eGFR has been calculated using the CKD EPI equation. This calculation has not been validated in all clinical situations. eGFR's persistently <60 mL/min signify possible Chronic Kidney Disease.    Anion gap 9 5 - 15    Comment: Performed at [facility], [place] [address] [place], [place] [address]    Dg Cholangiogram Operative  Result Date: 05/18/2017 CLINICAL DATA:  Intraoperative cholangiogram during laparoscopic cholecystectomy. EXAM: INTRAOPERATIVE CHOLANGIOGRAM FLUOROSCOPY TIME:  47 seconds COMPARISON:  Right upper quadrant abdominal ultrasound - 05/18/2017 FINDINGS: Intraoperative cholangiographic images of the right upper abdominal quadrant during laparoscopic cholecystectomy are provided for review. Surgical clips overlie the expected location of the gallbladder fossa. There are several ill-defined filling defects within the opacified portion of the neck of the gallbladder  compatible with known cholelithiasis. Contrast injection demonstrates selective cannulation of the central aspect of the cystic duct. There is passage of contrast through the central aspect of the cystic duct with filling of a dilated common bile duct. There is  no definitive passage of contrast to the level of the duodenum. There is minimal reflux of injected contrast into the common hepatic duct and central aspect of the mildly dilated intrahepatic biliary system. While there are no discrete filling defects within the opacified portions of the biliary system to suggest the presence of choledocholithiasis, however again, there is no definitive opacification of the duodenum. IMPRESSION: Cholelithiasis without evidence of choledocholithiasis, however there is no definitive passage of contrast into the duodenum. Correlation with the operative report is recommended. Further evaluation with ERCP could be performed as indicated. Electronically Signed   By: Sandi Mariscal M.D.   On: 05/18/2017 14:01   US Abdomen Limited Ruq  Result Date: 05/18/2017 CLINICAL DATA:  Acute onset of right upper quadrant abdominal pain. EXAM: ULTRASOUND ABDOMEN LIMITED RIGHT UPPER QUADRANT COMPARISON:  None. FINDINGS: Gallbladder: Stones are noted dependently within the gallbladder. No significant gallbladder wall thickening is noted. Evaluation for ultrasonographic Murphy's sign is suboptimal given that the patient is on pain medication. No pericholecystic fluid is seen. Common bile duct: Diameter: 1.6 cm, raising concern for distal obstruction. No distal stone is identified on this study. Liver: No focal lesion identified. Diffusely increased parenchymal echogenicity and coarsened echotexture raises question for fatty infiltration. Portal vein is patent on color Doppler imaging with normal direction of blood flow towards the liver. IMPRESSION: 1. Cholelithiasis.  No definite evidence for cholecystitis. 2. Significant dilatation of the common  bile duct to 1.6 cm, concerning for distal obstruction. No distal stone is characterized on this study. Would correlate with LFTs. 3. Diffuse fatty infiltration within the liver. Electronically Signed   By: Garald Balding M.D.   On: 05/18/2017 04:08    ROS negative except above Blood pressure (!) 152/77, pulse 74, temperature 99.2 F (37.3 C), temperature source Oral, resp. rate 15, height 5' 1" (1.549 m), weight 97.5 kg (215 lb), SpO2 97 %. Physical Examvital signs stable afebrileabdomen is soft nontender occasional bowel sounds labs pertinent for very minimal increase in transaminases bili a little decreased IOC reviewed questionable tiny stone versus spasm or edema  Assessment/Plan: Abnormal IOC Plan: Consider MRCP or even EUS however the risks benefits methods of ERCP was discussed with the patient and would proceed with that if worrisome symptoms or if LFTs continue to rise and will observe today and allow a soft diet and keep nothing by mouth after midnight and consider ERCP tomorrow or Monday versus outpatient follow-up if she does well today and I gave her my card and the risks of passing CBD stones was discussed as well as residual CBD stones  Kaedyn Belardo E 05/19/2017, 9:06 AM

## 2017-05-20 DIAGNOSIS — K8050 Calculus of bile duct without cholangitis or cholecystitis without obstruction: Secondary | ICD-10-CM | POA: Diagnosis present

## 2017-05-20 DIAGNOSIS — I10 Essential (primary) hypertension: Secondary | ICD-10-CM | POA: Diagnosis present

## 2017-05-20 DIAGNOSIS — R1011 Right upper quadrant pain: Secondary | ICD-10-CM | POA: Diagnosis present

## 2017-05-20 DIAGNOSIS — E785 Hyperlipidemia, unspecified: Secondary | ICD-10-CM | POA: Diagnosis present

## 2017-05-20 DIAGNOSIS — Z880 Allergy status to penicillin: Secondary | ICD-10-CM | POA: Diagnosis not present

## 2017-05-20 DIAGNOSIS — Z7982 Long term (current) use of aspirin: Secondary | ICD-10-CM | POA: Diagnosis not present

## 2017-05-20 DIAGNOSIS — Z6841 Body Mass Index (BMI) 40.0 and over, adult: Secondary | ICD-10-CM | POA: Diagnosis not present

## 2017-05-20 LAB — CBC WITH DIFFERENTIAL/PLATELET
Basophils Absolute: 0 10*3/uL (ref 0.0–0.1)
Basophils Relative: 1 %
EOS ABS: 0.1 10*3/uL (ref 0.0–0.7)
EOS PCT: 1 %
HCT: 35.3 % — ABNORMAL LOW (ref 36.0–46.0)
HEMOGLOBIN: 11.6 g/dL — AB (ref 12.0–15.0)
LYMPHS ABS: 1.7 10*3/uL (ref 0.7–4.0)
Lymphocytes Relative: 24 %
MCH: 29.7 pg (ref 26.0–34.0)
MCHC: 32.9 g/dL (ref 30.0–36.0)
MCV: 90.3 fL (ref 78.0–100.0)
Monocytes Absolute: 0.7 10*3/uL (ref 0.1–1.0)
Monocytes Relative: 9 %
Neutro Abs: 4.7 10*3/uL (ref 1.7–7.7)
Neutrophils Relative %: 65 %
PLATELETS: 251 10*3/uL (ref 150–400)
RBC: 3.91 MIL/uL (ref 3.87–5.11)
RDW: 13.7 % (ref 11.5–15.5)
WBC: 7.2 10*3/uL (ref 4.0–10.5)

## 2017-05-20 MED ORDER — SPIRONOLACTONE 25 MG PO TABS
25.0000 mg | ORAL_TABLET | Freq: Every day | ORAL | Status: DC
  Administered 2017-05-22: 25 mg via ORAL
  Filled 2017-05-20 (×3): qty 1

## 2017-05-20 MED ORDER — ROSUVASTATIN CALCIUM 10 MG PO TABS
10.0000 mg | ORAL_TABLET | Freq: Every day | ORAL | Status: DC
  Administered 2017-05-20 – 2017-05-21 (×2): 10 mg via ORAL
  Filled 2017-05-20 (×5): qty 1

## 2017-05-20 MED ORDER — DOCUSATE SODIUM 100 MG PO CAPS
100.0000 mg | ORAL_CAPSULE | Freq: Two times a day (BID) | ORAL | Status: DC
  Administered 2017-05-20 – 2017-05-22 (×4): 100 mg via ORAL
  Filled 2017-05-20 (×4): qty 1

## 2017-05-20 MED ORDER — AMLODIPINE BESYLATE 10 MG PO TABS
10.0000 mg | ORAL_TABLET | Freq: Every day | ORAL | Status: DC
  Administered 2017-05-20 – 2017-05-21 (×2): 10 mg via ORAL
  Filled 2017-05-20 (×4): qty 1

## 2017-05-20 NOTE — Progress Notes (Signed)
2 Days Post-Op   Subjective/Chief Complaint: Pain on right side is improving. No nausea   Objective: Vital signs in last 24 hours: Temp:  [98.7 F (37.1 C)-99.4 F (37.4 C)] 98.7 F (37.1 C) (04/21 0517) Pulse Rate:  [62-72] 62 (04/21 0517) Resp:  [14-16] 14 (04/21 0517) BP: (119-133)/(67-77) 123/77 (04/21 0517) SpO2:  [92 %-95 %] 94 % (04/21 0517) Last BM Date: 05/19/17  Intake/Output from previous day: 04/20 0701 - 04/21 0700 In: 2248.3 [P.O.:600; I.V.:1648.3] Out: -  Intake/Output this shift: No intake/output data recorded.  General appearance: alert and cooperative Resp: clear to auscultation bilaterally Cardio: regular rate and rhythm GI: soft, mild to moderate pain RUQ  Lab Results:  Recent Labs    05/18/17 0136 05/20/17 0446  WBC 11.5* 7.2  HGB 14.7 11.6*  HCT 42.6 35.3*  PLT 378 251   BMET Recent Labs    05/19/17 0401 05/19/17 0940  NA 141 140  K 3.8 4.4  CL 108 106  CO2 24 24  GLUCOSE 143* 121*  BUN 12 11  CREATININE 0.85 0.88  CALCIUM 8.5* 8.8*   PT/INR No results for input([name]): LABPROT, INR in the last 72 hours. ABG No results for input([name]): PHART, HCO3 in the last 72 hours.  Invalid input([name]): PCO2, PO2  Studies/Results: Dg Cholangiogram Operative  Result Date: 05/18/2017 CLINICAL DATA:  Intraoperative cholangiogram during laparoscopic cholecystectomy. EXAM: INTRAOPERATIVE CHOLANGIOGRAM FLUOROSCOPY TIME:  47 seconds COMPARISON:  Right upper quadrant abdominal ultrasound - 05/18/2017 FINDINGS: Intraoperative cholangiographic images of the right upper abdominal quadrant during laparoscopic cholecystectomy are provided for review. Surgical clips overlie the expected location of the gallbladder fossa. There are several ill-defined filling defects within the opacified portion of the neck of the gallbladder compatible with known cholelithiasis. Contrast injection demonstrates selective cannulation of the central aspect of the cystic duct. There is  passage of contrast through the central aspect of the cystic duct with filling of a dilated common bile duct. There is no definitive passage of contrast to the level of the duodenum. There is minimal reflux of injected contrast into the common hepatic duct and central aspect of the mildly dilated intrahepatic biliary system. While there are no discrete filling defects within the opacified portions of the biliary system to suggest the presence of choledocholithiasis, however again, there is no definitive opacification of the duodenum. IMPRESSION: Cholelithiasis without evidence of choledocholithiasis, however there is no definitive passage of contrast into the duodenum. Correlation with the operative report is recommended. Further evaluation with ERCP could be performed as indicated. Electronically Signed   By: Simonne ComeJohn  [name] M.D.   On: 05/18/2017 14:01    Anti-infectives: Anti-infectives (From admission, onward)   Start     Dose/Rate Route Frequency Ordered Stop   05/18/17 1137  sodium chloride 0.9 % with cefTRIAXone (ROCEPHIN) ADS Med    Note to Pharmacy:  Donna [name], Donna [name]   : cabinet override      05/18/17 1137 05/18/17 1156   05/18/17 1000  cefTRIAXone (ROCEPHIN) 2 g in sodium chloride 0.9 % 100 mL IVPB  Status:  Discontinued     2 g 200 mL/hr over 30 Minutes Intravenous Every 24 hours 05/18/17 0957 05/18/17 1433   05/18/17 0545  piperacillin-tazobactam (ZOSYN) IVPB 3.375 g     3.375 g 100 mL/hr over 30 Minutes Intravenous  Once 05/18/17 0531 05/18/17 0743      Assessment/Plan: [name]/p Procedure([name]): LAPAROSCOPIC CHOLECYSTECTOMY WITH INTRAOPERATIVE CHOLANGIOGRAM (N/A) Advance diet  LFT'[name] unchanged today. Appreciate GI input. Will monitor  lft'[name]. If they continue to decline then we will plan for discharge tomorrow with close follow up. If they continue to rise then she may need ERCP ambulate  LOS: 0 days    Donna [name],Donna [name] 05/20/2017

## 2017-05-20 NOTE — Progress Notes (Signed)
Donna RaringElia [name] 8:57 AM  Subjective: Patient doing well eating wellno abdominal pain nausea or vomiting no new complaints case discussed with her husband as well  Objective: Vital signs stable afebrile no acute distress abdomen is soft nontender liver tests essentially the same bili down a hair  Assessment: Abnormal IOC drainage currently doubt stone  Plan: Again the warnings of CBD stone versus the risks of various procedures was discussed and I think she is okay to go home and follow clinically and repeat liver tests in our office the day of surgical follow-up and call me sooner when necessary  Phys[facility] Choice Surgicenter IncMAGOD,Donna [name] E  Pager [phone] After 5PM or if no answer call [phone]

## 2017-05-21 ENCOUNTER — Inpatient Hospital Stay (HOSPITAL_COMMUNITY): Payer: Medicare Other | Admitting: Anesthesiology

## 2017-05-21 ENCOUNTER — Encounter (HOSPITAL_COMMUNITY): Admission: EM | Disposition: A | Discharge status: Home / Self Care | Payer: Self-pay | Source: Home / Self Care

## 2017-05-21 ENCOUNTER — Inpatient Hospital Stay (HOSPITAL_COMMUNITY): Payer: Medicare Other

## 2017-05-21 ENCOUNTER — Encounter (HOSPITAL_COMMUNITY): Payer: Self-pay

## 2017-05-21 HISTORY — PX: ENDOSCOPIC RETROGRADE CHOLANGIOPANCREATOGRAPHY (ERCP) WITH PROPOFOL: SHX5810

## 2017-05-21 LAB — COMPREHENSIVE METABOLIC PANEL
ALT: 414 U/L — AB (ref 14–54)
AST: 344 U/L — AB (ref 15–41)
Albumin: 3.1 g/dL — ABNORMAL LOW (ref 3.5–5.0)
Alkaline Phosphatase: 248 U/L — ABNORMAL HIGH (ref 38–126)
Anion gap: 7 (ref 5–15)
BUN: 6 mg/dL (ref 6–20)
CHLORIDE: 107 mmol/L (ref 101–111)
CO2: 26 mmol/L (ref 22–32)
CREATININE: 0.65 mg/dL (ref 0.44–1.00)
Calcium: 8.2 mg/dL — ABNORMAL LOW (ref 8.9–10.3)
GFR calc non Af Amer: >60 mL/min (ref >60)
GLUCOSE: 126 mg/dL — AB (ref 65–99)
Potassium: 4.2 mmol/L (ref 3.5–5.1)
Sodium: 140 mmol/L (ref 135–145)
Total Bilirubin: 4 mg/dL — ABNORMAL HIGH (ref 0.3–1.2)
Total Protein: 6.3 g/dL — ABNORMAL LOW (ref 6.5–8.1)

## 2017-05-21 SURGERY — ENDOSCOPIC RETROGRADE CHOLANGIOPANCREATOGRAPHY (ERCP) WITH PROPOFOL
Anesthesia: General

## 2017-05-21 MED ORDER — TRAMADOL HCL 50 MG PO TABS: 50.0000 mg | ORAL_TABLET | Freq: Four times a day (QID) | ORAL | Status: DC | PRN

## 2017-05-21 MED ORDER — SUGAMMADEX SODIUM 200 MG/2ML IV SOLN
INTRAVENOUS | Status: DC | PRN
  Administered 2017-05-21: 200 mg via INTRAVENOUS

## 2017-05-21 MED ORDER — IBUPROFEN 200 MG PO TABS: 600.0000 mg | ORAL_TABLET | Freq: Four times a day (QID) | ORAL | Status: DC | PRN

## 2017-05-21 MED ORDER — LACTATED RINGERS IV SOLN
INTRAVENOUS | Status: DC | PRN
  Administered 2017-05-21: 15:00:00 via INTRAVENOUS

## 2017-05-21 MED ORDER — PHENYLEPHRINE HCL 10 MG/ML IJ SOLN
INTRAMUSCULAR | Status: DC | PRN
  Administered 2017-05-21 (×4): 120 ug via INTRAVENOUS

## 2017-05-21 MED ORDER — CIPROFLOXACIN IN D5W 400 MG/200ML IV SOLN
INTRAVENOUS | Status: AC
  Filled 2017-05-21: qty 200

## 2017-05-21 MED ORDER — LIDOCAINE HCL (CARDIAC) PF 100 MG/5ML IV SOSY
PREFILLED_SYRINGE | INTRAVENOUS | Status: DC | PRN
  Administered 2017-05-21: 25 mg via INTRATRACHEAL
  Administered 2017-05-21: 75 mg via INTRAVENOUS

## 2017-05-21 MED ORDER — SODIUM CHLORIDE 0.9 % IV SOLN
INTRAVENOUS | Status: DC | PRN
  Administered 2017-05-21: 30 mL

## 2017-05-21 MED ORDER — PROPOFOL 10 MG/ML IV BOLUS
INTRAVENOUS | Status: DC | PRN
  Administered 2017-05-21: 170 mg via INTRAVENOUS

## 2017-05-21 MED ORDER — FENTANYL CITRATE (PF) 100 MCG/2ML IJ SOLN
INTRAMUSCULAR | Status: AC
  Filled 2017-05-21: qty 2

## 2017-05-21 MED ORDER — ACETAMINOPHEN-CODEINE #3 300-30 MG PO TABS
1.0000 | ORAL_TABLET | ORAL | Status: DC | PRN
  Filled 2017-05-21: qty 1

## 2017-05-21 MED ORDER — MORPHINE SULFATE (PF) 2 MG/ML IV SOLN
1.0000 mg | INTRAVENOUS | Status: DC | PRN
  Administered 2017-05-21 – 2017-05-22 (×3): 2 mg via INTRAVENOUS
  Filled 2017-05-21 (×3): qty 1

## 2017-05-21 MED ORDER — GLUCAGON HCL RDNA (DIAGNOSTIC) 1 MG IJ SOLR
INTRAMUSCULAR | Status: AC
  Filled 2017-05-21: qty 1

## 2017-05-21 MED ORDER — ONDANSETRON HCL 4 MG/2ML IJ SOLN
INTRAMUSCULAR | Status: DC | PRN
  Administered 2017-05-21: 4 mg via INTRAVENOUS

## 2017-05-21 MED ORDER — CIPROFLOXACIN IN D5W 400 MG/200ML IV SOLN
INTRAVENOUS | Status: DC | PRN
  Administered 2017-05-21 (×2): 400 mg via INTRAVENOUS

## 2017-05-21 MED ORDER — SODIUM CHLORIDE 0.9 % IV SOLN
INTRAVENOUS | Status: DC
  Administered 2017-05-21: 16:00:00 via INTRAVENOUS

## 2017-05-21 MED ORDER — FENTANYL CITRATE (PF) 100 MCG/2ML IJ SOLN
INTRAMUSCULAR | Status: DC | PRN
  Administered 2017-05-21: 100 ug via INTRAVENOUS

## 2017-05-21 MED ORDER — INDOMETHACIN 50 MG RE SUPP
RECTAL | Status: AC
  Filled 2017-05-21: qty 2

## 2017-05-21 MED ORDER — DEXAMETHASONE SODIUM PHOSPHATE 4 MG/ML IJ SOLN
INTRAMUSCULAR | Status: DC | PRN
  Administered 2017-05-21: 10 mg via INTRAVENOUS

## 2017-05-21 NOTE — Transfer of Care (Signed)
Immediate Anesthesia Transfer of Care Note  Patient: Donna [name]  Procedure(s) Performed: ENDOSCOPIC RETROGRADE CHOLANGIOPANCREATOGRAPHY (ERCP) WITH PROPOFOL (N/A )  Patient Location: PACU  Anesthesia Type:General  Level of Consciousness: awake, alert , oriented and patient cooperative  Airway & Oxygen Therapy: Patient Spontanous Breathing and Patient connected to face mask oxygen  Post-op Assessment: Report given to RN, Post -op Vital signs reviewed and stable and Patient moving all extremities X 4  Post vital signs: stable  Last Vitals:  Vitals Value Taken Time  BP 112/70 05/21/2017  4:30 PM  Temp    Pulse 64 05/21/2017  4:31 PM  Resp 12 05/21/2017  4:31 PM  SpO2 96 % 05/21/2017  4:31 PM  Vitals shown include unvalidated device data.  Last Pain:  Vitals:   05/21/17 1453  TempSrc: Oral  PainSc: 0-No pain      Patients Stated Pain Goal: 2 (05/20/17 2247)  Complications: No apparent anesthesia complications

## 2017-05-21 NOTE — Anesthesia Procedure Notes (Signed)
Procedure Name: Intubation Date/Time: 05/21/2017 3:29 PM Performed by: Lissa Morales, CRNA Pre-anesthesia Checklist: Patient identified, Emergency Drugs available, Suction available and Patient being monitored Patient Re-evaluated:Patient Re-evaluated prior to induction Oxygen Delivery Method: Circle system utilized Preoxygenation: Pre-oxygenation with 100% oxygen Induction Type: IV induction Ventilation: Mask ventilation without difficulty Laryngoscope Size: Mac and 4 Grade View: Grade II Tube type: Oral Tube size: 7.0 mm Number of attempts: 1 Airway Equipment and Method: Stylet and Oral airway Placement Confirmation: ETT inserted through vocal cords under direct vision,  positive ETCO2 and breath sounds checked- equal and bilateral Secured at: 21 cm Tube secured with: Tape Dental Injury: Teeth and Oropharynx as per pre-operative assessment  Comments: Chip in left front tooth as preop,poor dentition

## 2017-05-21 NOTE — Progress Notes (Signed)
3 Days Post-Op    CC:  RUQ pain  Subjective: Pt anxious with me making her NPO.  Pain is better, she says she cannot take anything PO for pain except Tylenol # 3, and plain Tylenol.  Nausea better.    Objective: Vital signs in last 24 hours: Temp:  [98.2 F (36.8 C)-98.9 F (37.2 C)] 98.2 F (36.8 C) (04/22 0554) Pulse Rate:  [64-72] 64 (04/22 0554) Resp:  [14-16] 14 (04/22 0554) BP: (120-156)/(57-75) 143/75 (04/22 0554) SpO2:  [93 %-98 %] 94 % (04/22 0554) Last BM Date: 05/19/17 700 PO Urine x 6 Afebrile, VSS LFT's rising IOC:  Cholelithiasis without evidence of choledocholithiasis, however there is no definitive passage of contrast into the duodenum. Correlation with the operative report is recommended. Further evaluation with ERCP could  Intake/Output from previous day: 04/21 0701 - 04/22 0700 In: 1500 [P.O.:700; I.V.:800] Out: -  Intake/Output this shift: No intake/output data recorded.  General appearance: alert, cooperative and no distress Resp: clear to auscultation bilaterally GI: soft, sore, sites OK.    Lab Results:  Recent Labs    05/20/17 0446  WBC 7.2  HGB 11.6*  HCT 35.3*  PLT 251    BMET Recent Labs    05/19/17 0940 05/21/17 0435  NA 140 140  K 4.4 4.2  CL 106 107  CO2 24 26  GLUCOSE 121* 126*  BUN 11 6  CREATININE 0.88 0.65  CALCIUM 8.8* 8.2*   PT/INR No results for input(s): LABPROT, INR in the last 72 hours.  Recent Labs  Lab 05/18/17 0305 05/19/17 0401 05/19/17 0940 05/21/17 0435  AST 19 96* 98* 344*  ALT 19 89* 99* 414*  ALKPHOS 72 86 89 248*  BILITOT 2.0* 1.9* 1.8* 4.0*  PROT 7.9 6.7 6.7 6.3*  ALBUMIN 4.1 3.5 3.5 3.1*     Lipase     Component Value Date/Time   LIPASE 26 05/18/2017 0136     Prior to Admission medications   Medication Sig Start Date End Date Taking? Authorizing Provider  amLODipine (NORVASC) 10 MG tablet Take 10 mg by mouth daily. 05/10/17  Yes [provider]  aspirin EC 81 MG tablet  Take 81 mg by mouth every other day. 08/25/16  Yes [provider]  rosuvastatin (CRESTOR) 10 MG tablet Take 10 mg by mouth daily. 05/10/17  Yes [provider]  spironolactone (ALDACTONE) 25 MG tablet Take 25 mg by mouth daily. 05/10/17  Yes [provider]  Vitamin D, Ergocalciferol, (DRISDOL) 50000 units CAPS capsule Take 500,000 Units by mouth once a week. 05/15/17  Yes [provider]    Medications: . amLODipine  10 mg Oral Daily  . docusate sodium  100 mg Oral BID  . rosuvastatin  10 mg Oral q1800  . spironolactone  25 mg Oral Daily   . dextrose 5 % and 0.9 % NaCl with KCl 20 mEq/L 1,000 mL (05/21/17 0251)   Anti-infectives (From admission, onward)   Start     Dose/Rate Route Frequency Ordered Stop   05/18/17 1137  sodium chloride 0.9 % with cefTRIAXone (ROCEPHIN) ADS Med    Note to Pharmacy:  Curlene Dolphin   : cabinet override      05/18/17 1137 05/18/17 1156   05/18/17 1000  cefTRIAXone (ROCEPHIN) 2 g in sodium chloride 0.9 % 100 mL IVPB  Status:  Discontinued     2 g 200 mL/hr over 30 Minutes Intravenous Every 24 hours 05/18/17 0957 05/18/17 1433   05/18/17 0545  piperacillin-tazobactam (ZOSYN) IVPB 3.375 g     3.375 g 100 mL/hr over 30 Minutes Intravenous  Once 05/18/17 0531 05/18/17 0743      Assessment/Plan Hypertension Hyperlipidemia Body mass index is 40.6  Gallstones with RUQ pain Laparoscopic cholecystectomy with IOC, 05/18/17, Dr. Chevis Pretty  FEN:IV fluids/soft diet ID: Zosyn/Rocephin pre op DVT:  SCD Follow up:  [facility]  Plan:  I have made her NPO, and calling GI to see.        LOS: 1 day    Danasia Baker 05/21/2017 [phone]

## 2017-05-21 NOTE — Op Note (Addendum)
[facility] Patient Name: Donna [name] Procedure Date: 05/21/2017 MRN: [id number] Attending MD: Vida Rigger , MD Date of Birth: Aug 01, 1950 CSN: [id number] Age: 67 Admit Type: Inpatient Procedure:                ERCP Indications:              Suspected bile duct stone(s) Providers:                Vida Rigger, MD, Priscella Mann RN, RN, Tomma Rakers, RN, Zoila Shutter, Technician, Anastasio Champion,                            CRNA Referring MD:              Medicines:                General Anesthesia Complications:            No immediate complications. Estimated Blood Loss:     Estimated blood loss: none. Procedure:                Pre-Anesthesia Assessment:                           - Prior to the procedure, a History and Physical                            was performed, and patient medications and                            allergies were reviewed. The patient's tolerance of                            previous anesthesia was also reviewed. The risks                            and benefits of the procedure and the sedation                            options and risks were discussed with the patient.                            All questions were answered, and informed consent                            was obtained. Prior Anticoagulants: The patient has                            taken no previous anticoagulant or antiplatelet                            agents. ASA Grade Assessment: II - A patient with                            mild systemic  disease. After reviewing the risks                            and benefits, the patient was deemed in                            satisfactory condition to undergo the procedure.                           After obtaining informed consent, the scope was                            passed under direct vision. Throughout the                            procedure, the patient's blood pressure, pulse, and                   oxygen saturations were monitored continuously. The                            [id number] scope was introduced through the                            mouth, and used to inject contrast into and used to                            cannulate the bile duct. The ERCP was accomplished                            without difficulty. The patient tolerated the                            procedure well. Scope In: Scope Out: Findings:      She did have a bulbous ampulla, and using the triple lumen       sphincterotome loaded with the JAG Jagwire deep selective cannulation       was obtained and on initial cholangiogram at least one stone was seen       and we proceeded with a Biliary sphincterotomy which was made with a       Hydratome sphincterotome using ERBE electrocautery. There was no       post-sphincterotomy bleeding. The biliary tree was swept with an       adjustable 15 -18 mm balloon starting at the bifurcation because the       duct was significantly dilated. A few small stones were removed.       Multiple balloon pull-through's were done using the 18 mm balloon until       the distal duct we lowered it to 15 mm and were able to pull the 15 mm       balloon through the patent sphincterotomy site with gentle pressure and       we proceeded with an occlusion cholangiogram at the end of the procedure       and No stones remained. A few other balloon pull-through's were done       just to be sure and  the wire and the balloon was removed and the patient       duct was draining adequately and not mentioned above throughout the       procedure there was no pancreatic duct injection or wire advancement Impression:               - Choledocholithiasis was found. Complete removal                            was accomplished by biliary sphincterotomy and                            balloon extraction.                           - A biliary sphincterotomy was performed.                            - The biliary tree was swept. There was no                            pancreatic duct injection or wire advancement and                            there is adequate biliary drainage attended the                            procedure Moderate Sedation:      N/A- Per Anesthesia Care Recommendation:           - Clear liquid diet today. May advance tomorrow if                            doing fine and recheck labs tomorrow and hopefully                            she'll be able to go home                           - Continue present medications.                           - Return to GI clinic PRN. Happy to repeat labs in                            our office on surgical follow-up                           - Telephone GI clinic if symptomatic PRN. Procedure Code(s):        --- Professional ---                           [phone], Endoscopic retrograde                            cholangiopancreatography (ERCP); with removal of  calculi/debris from biliary/pancreatic duct(s)                           [id number], Endoscopic retrograde                            cholangiopancreatography (ERCP); with                            sphincterotomy/papillotomy Diagnosis Code(s):        --- Professional ---                           K80.50, Calculus of bile duct without cholangitis                            or cholecystitis without obstruction CPT copyright 2017 American Medical Association. All rights reserved. The codes documented in this report are preliminary and upon coder review may  be revised to meet current compliance requirements. Vida RiggerMarc Faline Langer, MD 05/21/2017 4:22:21 PM This report has been signed electronically. Number of Addenda: 0

## 2017-05-21 NOTE — Progress Notes (Signed)
Donna [name] 3:20 PM  Subjective: Patient seen and examined and discussed with surgical team and check she feels better today than she has since her surgery and she has no new complaintsbut her liver tests increased significantly and we rediscussed ERCP with her and her family  Objective: Vital signs stable afebrile no acute distress exam please see preassessment evaluationLFTs increased as above  Assessment: Probable CBD stone versus stricture versus edema from passing stone  Plan: The risks benefits methods and success rate of ERCP was rediscussed with the patient and the family and will proceed today with anesthesia assistance with further workup and plans pending those findings  [facility],Dodd Schmid E  Pager [phone] After 5PM or if no answer call [phone]

## 2017-05-21 NOTE — Anesthesia Preprocedure Evaluation (Signed)
Anesthesia Evaluation  Patient identified by MRN, date of birth, ID band Patient awake    Reviewed: Allergy & Precautions, NPO status , Patient's Chart, lab work & pertinent test results  Airway Mallampati: II  TM Distance: >3 FB Neck ROM: Full    Dental no notable dental hx.    Pulmonary neg pulmonary ROS,    Pulmonary exam normal breath sounds clear to auscultation       Cardiovascular hypertension, Pt. on medications Normal cardiovascular exam Rhythm:Regular Rate:Normal     Neuro/Psych negative neurological ROS  negative psych ROS   GI/Hepatic negative GI ROS, Neg liver ROS,   Endo/Other  Morbid obesity  Renal/GU negative Renal ROS     Musculoskeletal negative musculoskeletal ROS (+)   Abdominal   Peds  Hematology negative hematology ROS (+)   Anesthesia Other Findings   Reproductive/Obstetrics negative OB ROS                             Anesthesia Physical  Anesthesia Plan  ASA: III  Anesthesia Plan: General   Post-op Pain Management:    Induction: Intravenous  PONV Risk Score and Plan: 4 or greater and Ondansetron, Dexamethasone, Scopolamine patch - Pre-op and Treatment may vary due to age or medical condition  Airway Management Planned: Oral ETT  Additional Equipment:   Intra-op Plan:   Post-operative Plan: Extubation in OR  Informed Consent: I have reviewed the patients History and Physical, chart, labs and discussed the procedure including the risks, benefits and alternatives for the proposed anesthesia with the patient or authorized representative who has indicated his/her understanding and acceptance.   Dental advisory given  Plan Discussed with: CRNA  Anesthesia Plan Comments:         Anesthesia Quick Evaluation

## 2017-05-22 ENCOUNTER — Encounter (HOSPITAL_COMMUNITY): Payer: Self-pay | Admitting: General Surgery

## 2017-05-22 DIAGNOSIS — E782 Mixed hyperlipidemia: Secondary | ICD-10-CM | POA: Diagnosis present

## 2017-05-22 DIAGNOSIS — E785 Hyperlipidemia, unspecified: Secondary | ICD-10-CM | POA: Diagnosis present

## 2017-05-22 DIAGNOSIS — I10 Essential (primary) hypertension: Secondary | ICD-10-CM | POA: Diagnosis present

## 2017-05-22 HISTORY — DX: Essential (primary) hypertension: I10

## 2017-05-22 LAB — COMPREHENSIVE METABOLIC PANEL
ALT: 506 U/L — ABNORMAL HIGH (ref 14–54)
ANION GAP: 8 (ref 5–15)
AST: 270 U/L — ABNORMAL HIGH (ref 15–41)
Albumin: 3.2 g/dL — ABNORMAL LOW (ref 3.5–5.0)
Alkaline Phosphatase: 335 U/L — ABNORMAL HIGH (ref 38–126)
BUN: 8 mg/dL (ref 6–20)
CO2: 26 mmol/L (ref 22–32)
Calcium: 8.9 mg/dL (ref 8.9–10.3)
Chloride: 108 mmol/L (ref 101–111)
Creatinine, Ser: 0.74 mg/dL (ref 0.44–1.00)
GFR calc non Af Amer: >60 mL/min (ref >60)
Glucose, Bld: 159 mg/dL — ABNORMAL HIGH (ref 65–99)
Potassium: 5.1 mmol/L (ref 3.5–5.1)
SODIUM: 142 mmol/L (ref 135–145)
Total Bilirubin: 2.8 mg/dL — ABNORMAL HIGH (ref 0.3–1.2)
Total Protein: 6.8 g/dL (ref 6.5–8.1)

## 2017-05-22 LAB — CBC
HCT: 37 % (ref 36.0–46.0)
Hemoglobin: 12 g/dL (ref 12.0–15.0)
MCH: 29 pg (ref 26.0–34.0)
MCHC: 32.4 g/dL (ref 30.0–36.0)
MCV: 89.4 fL (ref 78.0–100.0)
Platelets: 263 10*3/uL (ref 150–400)
RBC: 4.14 MIL/uL (ref 3.87–5.11)
RDW: 13.5 % (ref 11.5–15.5)
WBC: 8.7 10*3/uL (ref 4.0–10.5)

## 2017-05-22 LAB — LIPASE, BLOOD: Lipase: 25 U/L (ref 11–51)

## 2017-05-22 MED ORDER — ACETAMINOPHEN-CODEINE #3 300-30 MG PO TABS: 1.0000 | ORAL_TABLET | ORAL | 0 refills | Status: DC | PRN

## 2017-05-22 MED ORDER — ACETAMINOPHEN 325 MG PO TABS: 650.0000 mg | ORAL_TABLET | Freq: Four times a day (QID) | ORAL | Status: DC | PRN

## 2017-05-22 NOTE — Anesthesia Postprocedure Evaluation (Signed)
Anesthesia Post Note  Patient: Donna [name]  Procedure(s) Performed: ENDOSCOPIC RETROGRADE CHOLANGIOPANCREATOGRAPHY (ERCP) WITH PROPOFOL (N/A )     Patient location during evaluation: PACU Anesthesia Type: General Level of consciousness: sedated and patient cooperative Pain management: pain level controlled Vital Signs Assessment: post-procedure vital signs reviewed and stable Respiratory status: spontaneous breathing Cardiovascular status: stable Anesthetic complications: no    Last Vitals:  Vitals:   05/22/17 0527 05/22/17 0942  BP: 136/85 136/64  Pulse: 63 80  Resp: 18   Temp: 36.9 C   SpO2: 95%     Last Pain:  Vitals:   05/22/17 0527  TempSrc: Oral  PainSc:                  Lewie LoronJohn Kelven Flater

## 2017-05-22 NOTE — Progress Notes (Signed)
Pt alert, oriented, tolerating diet.  D/C instruction and prescription was given. All questions answered and pt was d/cd home with spouse.

## 2017-05-22 NOTE — Progress Notes (Addendum)
1 Day Post-Op    CC: Right upper quadrant pain  Subjective: She feels much better this AM.  Port sites all look good abdomen soft nontender.  No issues with clears.  Pain is much better.  Objective: Vital signs in last 24 hours: Temp:  [98 F (36.7 C)-98.8 F (37.1 C)] 98.4 F (36.9 C) (04/23 0527) Pulse Rate:  [59-72] 63 (04/23 0527) Resp:  [16-18] 18 (04/23 0527) BP: (105-149)/(62-87) 136/85 (04/23 0527) SpO2:  [93 %-100 %] 95 % (04/23 0527) Weight:  [97.5 kg (215 lb)] 97.5 kg (215 lb) (04/22 1453) Last BM Date: 05/20/17 1200 IV Nothing p.o. Recorded No BM Voided x10 Afebrile vital signs are stable Labs shows normal WBC 8.7, alk phos slightly elevated AST down to 270, ALT elevated 506, total bilirubin down to 2.8  Intake/Output from previous day: 04/22 0701 - 04/23 0700 In: 2200 [I.V.:2200] Out: 0  Intake/Output this shift: No intake/output data recorded.  General appearance: alert, cooperative and no distress Resp: clear to auscultation bilaterally GI: soft, non-tender; bowel sounds normal; no masses,  no organomegaly and Sites look fine.  Lab Results:  Recent Labs    05/20/17 0446 05/22/17 0450  WBC 7.2 8.7  HGB 11.6* 12.0  HCT 35.3* 37.0  PLT 251 263    BMET Recent Labs    05/21/17 0435 05/22/17 0450  NA 140 142  K 4.2 5.1  CL 107 108  CO2 26 26  GLUCOSE 126* 159*  BUN 6 8  CREATININE 0.65 0.74  CALCIUM 8.2* 8.9   PT/INR No results for input(s): LABPROT, INR in the last 72 hours.  Recent Labs  Lab 05/18/17 0305 05/19/17 0401 05/19/17 0940 05/21/17 0435 05/22/17 0450  AST 19 96* 98* 344* 270*  ALT 19 89* 99* 414* 506*  ALKPHOS 72 86 89 248* 335*  BILITOT 2.0* 1.9* 1.8* 4.0* 2.8*  PROT 7.9 6.7 6.7 6.3* 6.8  ALBUMIN 4.1 3.5 3.5 3.1* 3.2*     Lipase     Component Value Date/Time   LIPASE 25 05/22/2017 0450     Medications: . amLODipine  10 mg Oral Daily  . docusate sodium  100 mg Oral BID  . rosuvastatin  10 mg Oral q1800  .  spironolactone  25 mg Oral Daily   . dextrose 5 % and 0.9 % NaCl with KCl 20 mEq/L 100 mL/hr at 05/22/17 0036     Assessment/Plan Hypertension Hyperlipidemia Body mass index is 40.6  Gallstones with RUQ pain Laparoscopic cholecystectomy with IOC, 05/18/17, Dr. Autumn Messing ERCP, sphincterotomy, stone removal,/22/19 Dr. Lizbeth Bark  FEN:IV fluids/soft diet ID: Zosyn/Rocephin pre op DVT:  SCD Follow up:  [facility]  Plan: Advance her diet as she tolerates breakfast well plan to discharge home later this afternoon.  We will have her recheck her labs 2 days prior to follow-up in the [facility].  Tolerating diet and had a BM, wants to go home.   I have personally reviewed the patients medication history on the [place] controlled substance database.      LOS: 2 days    Donna [name] 05/22/2017 [phone]

## 2017-05-22 NOTE — Discharge Instructions (Signed)
[facility] ______[facility] LAPAROSCOPIC SURGERY: POST OP INSTRUCTIONS Have your labs rechecked with the results sent to our office 24-72 hours prior to your follow-up visit.  Always review your discharge instruction sheet given to you by the facility where your surgery was performed. IF YOU HAVE DISABILITY OR FAMILY LEAVE FORMS, YOU MUST BRING THEM TO THE OFFICE FOR PROCESSING.   DO NOT GIVE THEM TO YOUR DOCTOR.  1. A prescription for pain medication may be given to you upon discharge.  Take your pain medication as prescribed, if needed.  If narcotic pain medicine is not needed, then you may take acetaminophen (Tylenol) or ibuprofen (Advil) as needed. 2. Take your usually prescribed medications unless otherwise directed. 3. If you need a refill on your pain medication, please contact your pharmacy.  They will contact our office to request authorization. Prescriptions will not be filled after 5pm or on week-ends. 4. You should follow a light diet the first few days after arrival home, such as soup and crackers, etc.  Be sure to include lots of fluids daily. 5. Most patients will experience some swelling and bruising in the area of the incisions.  Ice packs will help.  Swelling and bruising can take several days to resolve.  6. It is common to experience some constipation if taking pain medication after surgery.  Increasing fluid intake and taking a stool softener (such as Colace) will usually help or prevent this problem from occurring.  A mild laxative (Milk of Magnesia or Miralax) should be taken according to package instructions if there are no bowel movements after 48 hours. 7. Unless discharge instructions indicate otherwise, you may remove your bandages 24-48 hours after surgery, and you may shower at that time.  You may have steri-strips (small skin tapes) in place directly over the incision.  These strips should be left on the skin for 7-10 days.  If your surgeon used skin glue on  the incision, you may shower in 24 hours.  The glue will flake off over the next 2-3 weeks.  Any sutures or staples will be removed at the office during your follow-up visit. 8. ACTIVITIES:  You may resume regular (light) daily activities beginning the next day--such as daily self-care, walking, climbing stairs--gradually increasing activities as tolerated.  You may have sexual intercourse when it is comfortable.  Refrain from any heavy lifting or straining until approved by your doctor. a. You may drive when you are no longer taking prescription pain medication, you can comfortably wear a seatbelt, and you can safely maneuver your car and apply brakes. b. RETURN TO WORK:  __________________________________________________________ 9. You should see your doctor in the office for a follow-up appointment approximately 2-3 weeks after your surgery.  Make sure that you call for this appointment within a day or two after you arrive home to insure a convenient appointment time. 10. OTHER INSTRUCTIONS: __________________________________________________________________________________________________________________________ __________________________________________________________________________________________________________________________ WHEN TO CALL YOUR DOCTOR: 1. Fever over 101.0 2. Inability to urinate 3. Continued bleeding from incision. 4. Increased pain, redness, or drainage from the incision. 5. Increasing abdominal pain  The clinic staff is available to answer your questions during regular business hours.  Please dont hesitate to call and ask to speak to one of the nurses for clinical concerns.  If you have a medical emergency, go to the nearest emergency room or call 911.  A surgeon from [facility] is always on call at the hospital. [address], [place], [place]  [address] ?  [id number], [place], [place]   [id number] [phone] ? [phone] ? [phone]  [phone] Web site: [email or web address]  If you have troubles with constipation you can take some MiraLAX which is available over-the-counter at any drugstore.  Follow the package instructions.   Endoscopic Retrograde Cholangiopancreatogram, Care After This sheet gives you information about how to care for yourself after your procedure. Your health care provider may also give you more specific instructions. If you have problems or questions, contact your health care provider. What can I expect after the procedure? After the procedure, it is common to have:  Soreness in your throat.  Nausea.  Bloating.  Dizziness.  Tiredness (fatigue).  Follow these instructions at home:  Take over-the-counter and prescription medicines only as told by your health care provider.  Do not drive for 24 hours if you were given a medicine to help you relax (sedative) during your procedure. Have someone stay with you for 24 hours after the procedure.  Return to your normal activities as told by your health care provider. Ask your health care provider what activities are safe for you.  Return to eating what you normally do as soon as you feel well enough or as told by your health care provider.  Keep all follow-up visits as told by your health care provider. This is important. Contact a health care provider if:  You have pain in your abdomen that does not get better with medicine.  You develop signs of infection, such as: ? Chills. ? Feeling unwell. Get help right away if:  You have difficulty swallowing.  You have worsening pain in your throat, chest, or abdomen.  You vomit bright red blood or a substance that looks like coffee grounds.  You have bloody or very black stools.  You have a fever.  You have a sudden increase in swelling (bloating) in your abdomen. Summary  After the procedure, it is common to feel tired and to have some discomfort in your throat.  Contact your  health care provider if you have signs of infection--such as chills or feeling unwell--or if you have pain that does not improve with medicine.  Get help right away if you have trouble swallowing, worsening pain, bloody or black vomit, bloody or black stools, a fever, or increased swelling in your abdomen.  Keep all follow-up visits as told by your health care provider. This is important. This information is not intended to replace advice given to you by your health care provider. Make sure you discuss any questions you have with your health care provider. Document Released: 11/06/2012 Document Revised: 12/06/2015 Document Reviewed: 12/06/2015 Elsevier Interactive Patient Education  2017 [employer]

## 2017-05-22 NOTE — Discharge Summary (Signed)
Physician Discharge Summary  Patient ID: Donna [name] MRN: [id number] DOB/AGE: 04-18-1950 67 y.o.  Admit date: 05/18/2017 Discharge date: 05/22/2017  Admission Diagnoses:  Symptomatic gallstones  Discharge Diagnoses:  Gallstones with RUQ pain Hypertension Hyperlipidemia Body mass index is 40.6   Principal Problem:   Gallstones Active Problems:   Essential hypertension   Hyperlipidemia   PROCEDURES:  Laparoscopic cholecystectomy with IOC, 05/18/17, Dr. Chevis PrettyPaul Toth ERCP, sphincterotomy, stone removal,/22/[facility]   [facility] Course:  The patient is a 67 year old white female who presents with abdominal pain in the RUQ that started 3 days ago. Pain was associated with nausea and vomiting. Denies fever. She felt fine yesterday then the pain came back today. She came to ER and u/s shows gallstones. She was admitted by Dr. Carolynne Edouardoth and taken to the OR on 05/18/17.  IOC showed: Cholelithiasis without evidence of choledocholithiasis, however there is no definitive passage of contrast into the duodenum. LFT's did not improve and went on on 4/22.  She was being followed by DR. Magod and he recommended proceeding with ERCP and this was done on 05/21/17.  Bilirubin fell from 4.0 to 2.8 over night, AST and ALT were somewhat better after 24 hours.  WBC was normal.  Her diet was advanced she was up walking pain well controlled and she was ready for discharge after lunch on 05/22/17.  CBC Latest Ref Rng & Units 05/22/2017 05/20/2017 05/18/2017  WBC 4.0 - 10.5 K/uL 8.7 7.2 11.5(H)  Hemoglobin 12.0 - 15.0 g/dL 13.212.0 11.6(L) 14.7  Hematocrit 36.0 - 46.0 % 37.0 35.3(L) 42.6  Platelets 150 - 400 K/uL 263 251 378   CMP Latest Ref Rng & Units 05/22/2017 05/21/2017 05/19/2017  Glucose 65 - 99 mg/dL 440(N159(H) 027(O126(H) 536(U121(H)  BUN 6 - 20 mg/dL 8 6 11   Creatinine 0.44 - 1.00 mg/dL 4.400.74 3.470.65 4.250.88  Sodium 135 - 145 mmol/L 142 140 140  Potassium 3.5 - 5.1 mmol/L 5.1 4.2 4.4  Chloride 101 - 111 mmol/L 108 107 106  CO2  22 - 32 mmol/L 26 26 24   Calcium 8.9 - 10.3 mg/dL 8.9 9.5(G8.2(L) 3.8(V8.8(L)  Total Protein 6.5 - 8.1 g/dL 6.8 6.3(L) 6.7  Total Bilirubin 0.3 - 1.2 mg/dL 2.8(H) 4.0(H) 1.8(H)  Alkaline Phos 38 - 126 U/L 335(H) 248(H) 89  AST 15 - 41 U/L 270(H) 344(H) 98(H)  ALT 14 - 54 U/L 506(H) 414(H) 99(H)    Condition on discharge:  Improved   Disposition: Discharge disposition: 01-Home or Self Care        Allergies as of 05/22/2017      Reactions   Penicillins    Unsure of the reaction- happened around age 67      Medication List    TAKE these medications   acetaminophen 325 MG tablet Commonly known as:  TYLENOL Take 2 tablets (650 mg total) by mouth every 6 (six) hours as needed for mild pain or headache. You can take 2 tablets every 4 hours as needed.  You can buy this over-the-counter at any drugstore.  You cannot take more than 4000 mg of Tylenol per day.  Your prescribed pain medication includes Tylenol, be sure to count each pill so you do not exceed 4000 mg of Tylenol per day.   acetaminophen-codeine 300-30 MG tablet Commonly known as:  TYLENOL #3 Take 1-2 tablets by mouth every 4 (four) hours as needed for moderate pain.   amLODipine 10 MG tablet Commonly known as:  NORVASC Take 10 mg  by mouth daily.   aspirin EC 81 MG tablet Take 81 mg by mouth every other day.   rosuvastatin 10 MG tablet Commonly known as:  CRESTOR Take 10 mg by mouth daily.   spironolactone 25 MG tablet Commonly known as:  ALDACTONE Take 25 mg by mouth daily.   Vitamin D (Ergocalciferol) 50000 units Caps capsule Commonly known as:  DRISDOL Take 500,000 Units by mouth once a week.      Follow-up Information    [facility] Follow up on 06/05/2017.   Specialty:  General Surgery Why:  Your appointment is at 9:45 AM.  Be at the office 30 minutes early for check-in.  Bring photo ID and insurance information.  Have your labs checked at least 24 hours prior to your follow-up  appointment. Contact information: [address] [place] [address] [phone]           Signed: Sherrie George 05/22/2017, 11:55 AM

## 2017-05-23 ENCOUNTER — Encounter (HOSPITAL_COMMUNITY): Payer: Self-pay | Admitting: Gastroenterology

## 2017-05-29 ENCOUNTER — Ambulatory Visit: Payer: Medicare Other | Admitting: Family Medicine

## 2017-05-30 ENCOUNTER — Ambulatory Visit: Payer: Medicare Other | Admitting: Family Medicine

## 2017-06-04 ENCOUNTER — Ambulatory Visit: Payer: Medicare Other | Admitting: Family Medicine

## 2017-06-07 ENCOUNTER — Ambulatory Visit (INDEPENDENT_AMBULATORY_CARE_PROVIDER_SITE_OTHER): Payer: Medicare Other | Admitting: Family Medicine

## 2017-06-07 ENCOUNTER — Encounter: Payer: Self-pay | Admitting: Family Medicine

## 2017-06-07 ENCOUNTER — Encounter

## 2017-06-07 ENCOUNTER — Telehealth: Payer: Self-pay | Admitting: Family Medicine

## 2017-06-07 VITALS — BP 132/78 | HR 96 | Ht 61.0 in | Wt 213.0 lb

## 2017-06-07 DIAGNOSIS — E782 Mixed hyperlipidemia: Secondary | ICD-10-CM

## 2017-06-07 DIAGNOSIS — I10 Essential (primary) hypertension: Secondary | ICD-10-CM

## 2017-06-07 DIAGNOSIS — Z9889 Other specified postprocedural states: Secondary | ICD-10-CM | POA: Insufficient documentation

## 2017-06-07 DIAGNOSIS — E559 Vitamin D deficiency, unspecified: Secondary | ICD-10-CM | POA: Diagnosis not present

## 2017-06-07 MED ORDER — AMLODIPINE BESYLATE 10 MG PO TABS: 10.0000 mg | ORAL_TABLET | Freq: Every day | ORAL | 1 refills | Status: DC

## 2017-06-07 MED ORDER — ROSUVASTATIN CALCIUM 10 MG PO TABS: 10.0000 mg | ORAL_TABLET | Freq: Every day | ORAL | 1 refills | Status: DC

## 2017-06-07 MED ORDER — SPIRONOLACTONE 25 MG PO TABS: 25.0000 mg | ORAL_TABLET | Freq: Every day | ORAL | 1 refills | Status: DC

## 2017-06-07 NOTE — Telephone Encounter (Signed)
Copied from CRM [id number]. Topic: Quick Communication - Rx Refill/Question >> Jun 07, 2017  4:39 PM Raquel Sarna wrote: Vitamin D, Ergocalciferol, (DRISDOL) 50000 units CAPS capsule  Wasn't refilled with other medications.  Please refill.  [facility] - [place], [place] - [address] [place] [address] Phone: [phone] Fax: [phone]

## 2017-06-07 NOTE — Patient Instructions (Signed)
DASH Eating Plan DASH stands for "Dietary Approaches to Stop Hypertension." The DASH eating plan is a healthy eating plan that has been shown to reduce high blood pressure (hypertension). It may also reduce your risk for type 2 diabetes, heart disease, and stroke. The DASH eating plan may also help with weight loss. What are tips for following this plan? General guidelines  Avoid eating more than 2,300 mg (milligrams) of salt (sodium) a day. If you have hypertension, you may need to reduce your sodium intake to 1,500 mg a day.  Limit alcohol intake to no more than 1 drink a day for nonpregnant women and 2 drinks a day for men. One drink equals 12 oz of beer, 5 oz of wine, or 1 oz of hard liquor.  Work with your health care provider to maintain a healthy body weight or to lose weight. Ask what an ideal weight is for you.  Get at least 30 minutes of exercise that causes your heart to beat faster (aerobic exercise) most days of the week. Activities may include walking, swimming, or biking.  Work with your health care provider or diet and nutrition specialist (dietitian) to adjust your eating plan to your individual calorie needs. Reading food labels  Check food labels for the amount of sodium per serving. Choose foods with less than 5 percent of the Daily Value of sodium. Generally, foods with less than 300 mg of sodium per serving fit into this eating plan.  To find whole grains, look for the word "whole" as the first word in the ingredient list. Shopping  Buy products labeled as "low-sodium" or "no salt added."  Buy fresh foods. Avoid canned foods and premade or frozen meals. Cooking  Avoid adding salt when cooking. Use salt-free seasonings or herbs instead of table salt or sea salt. Check with your health care provider or pharmacist before using salt substitutes.  Do not fry foods. Cook foods using healthy methods such as baking, boiling, grilling, and broiling instead.  Cook with  heart-healthy oils, such as olive, canola, soybean, or sunflower oil. Meal planning   Eat a balanced diet that includes: ? 5 or more servings of fruits and vegetables each day. At each meal, try to fill half of your plate with fruits and vegetables. ? Up to 6-8 servings of whole grains each day. ? Less than 6 oz of lean meat, poultry, or fish each day. A 3-oz serving of meat is about the same size as a deck of cards. One egg equals 1 oz. ? 2 servings of low-fat dairy each day. ? A serving of nuts, seeds, or beans 5 times each week. ? Heart-healthy fats. Healthy fats called Omega-3 fatty acids are found in foods such as flaxseeds and coldwater fish, like sardines, salmon, and mackerel.  Limit how much you eat of the following: ? Canned or prepackaged foods. ? Food that is high in trans fat, such as fried foods. ? Food that is high in saturated fat, such as fatty meat. ? Sweets, desserts, sugary drinks, and other foods with added sugar. ? Full-fat dairy products.  Do not salt foods before eating.  Try to eat at least 2 vegetarian meals each week.  Eat more home-cooked food and less restaurant, buffet, and fast food.  When eating at a restaurant, ask that your food be prepared with less salt or no salt, if possible. What foods are recommended? The items listed may not be a complete list. Talk with your dietitian about what   dietary choices are best for you. Grains Whole-grain or whole-wheat bread. Whole-grain or whole-wheat pasta. Brown rice. Oatmeal. Quinoa. Bulgur. Whole-grain and low-sodium cereals. Pita bread. Low-fat, low-sodium crackers. Whole-wheat flour tortillas. Vegetables Fresh or frozen vegetables (raw, steamed, roasted, or grilled). Low-sodium or reduced-sodium tomato and vegetable juice. Low-sodium or reduced-sodium tomato sauce and tomato paste. Low-sodium or reduced-sodium canned vegetables. Fruits All fresh, dried, or frozen fruit. Canned fruit in natural juice (without  added sugar). Meat and other protein foods Skinless chicken or turkey. Ground chicken or turkey. Pork with fat trimmed off. Fish and seafood. Egg whites. Dried beans, peas, or lentils. Unsalted nuts, nut butters, and seeds. Unsalted canned beans. Lean cuts of beef with fat trimmed off. Low-sodium, lean deli meat. Dairy Low-fat (1%) or fat-free (skim) milk. Fat-free, low-fat, or reduced-fat cheeses. Nonfat, low-sodium ricotta or cottage cheese. Low-fat or nonfat yogurt. Low-fat, low-sodium cheese. Fats and oils Soft margarine without trans fats. Vegetable oil. Low-fat, reduced-fat, or light mayonnaise and salad dressings (reduced-sodium). Canola, safflower, olive, soybean, and sunflower oils. Avocado. Seasoning and other foods Herbs. Spices. Seasoning mixes without salt. Unsalted popcorn and pretzels. Fat-free sweets. What foods are not recommended? The items listed may not be a complete list. Talk with your dietitian about what dietary choices are best for you. Grains Baked goods made with fat, such as croissants, muffins, or some breads. Dry pasta or rice meal packs. Vegetables Creamed or fried vegetables. Vegetables in a cheese sauce. Regular canned vegetables (not low-sodium or reduced-sodium). Regular canned tomato sauce and paste (not low-sodium or reduced-sodium). Regular tomato and vegetable juice (not low-sodium or reduced-sodium). Pickles. Olives. Fruits Canned fruit in a light or heavy syrup. Fried fruit. Fruit in cream or butter sauce. Meat and other protein foods Fatty cuts of meat. Ribs. Fried meat. Bacon. Sausage. Bologna and other processed lunch meats. Salami. Fatback. Hotdogs. Bratwurst. Salted nuts and seeds. Canned beans with added salt. Canned or smoked fish. Whole eggs or egg yolks. Chicken or turkey with skin. Dairy Whole or 2% milk, cream, and half-and-half. Whole or full-fat cream cheese. Whole-fat or sweetened yogurt. Full-fat cheese. Nondairy creamers. Whipped toppings.  Processed cheese and cheese spreads. Fats and oils Butter. Stick margarine. Lard. Shortening. Ghee. Bacon fat. Tropical oils, such as coconut, palm kernel, or palm oil. Seasoning and other foods Salted popcorn and pretzels. Onion salt, garlic salt, seasoned salt, table salt, and sea salt. Worcestershire sauce. Tartar sauce. Barbecue sauce. Teriyaki sauce. Soy sauce, including reduced-sodium. Steak sauce. Canned and packaged gravies. Fish sauce. Oyster sauce. Cocktail sauce. Horseradish that you find on the shelf. Ketchup. Mustard. Meat flavorings and tenderizers. Bouillon cubes. Hot sauce and Tabasco sauce. Premade or packaged marinades. Premade or packaged taco seasonings. Relishes. Regular salad dressings. Where to find more information:  National Heart, Lung, and Blood Institute: www.nhlbi.nih.gov  American Heart Association: www.heart.org Summary  The DASH eating plan is a healthy eating plan that has been shown to reduce high blood pressure (hypertension). It may also reduce your risk for type 2 diabetes, heart disease, and stroke.  With the DASH eating plan, you should limit salt (sodium) intake to 2,300 mg a day. If you have hypertension, you may need to reduce your sodium intake to 1,500 mg a day.  When on the DASH eating plan, aim to eat more fresh fruits and vegetables, whole grains, lean proteins, low-fat dairy, and heart-healthy fats.  Work with your health care provider or diet and nutrition specialist (dietitian) to adjust your eating plan to your individual   calorie needs. This information is not intended to replace advice given to you by your health care provider. Make sure you discuss any questions you have with your health care provider. Document Released: 01/05/2011 Document Revised: 01/10/2016 Document Reviewed: 01/10/2016 Elsevier Interactive Patient Education  2018 Elsevier Inc.  

## 2017-06-07 NOTE — Progress Notes (Signed)
Subjective:  Patient ID: Donna [name], female    DOB: [birth year]  Age: 67 y.o. MRN: [id number]  CC: Establish Care   HPI Donna [name] presents for her hypertension, hyperlipidemia and vitamin D deficiency.  She is status post recent cholecystectomy on an emergent basis on the 19th of last month.  She has done well since.  She continues to have loose but formed stools.  She is having no significant abdominal pain or fevers or chills.  Wound sites are healing as far she knows.  Chart review shows that her LFTs were elevated.  She continues to take both of her blood pressure medicines.  She is also taking her rosuvastatin.  Her daughter was recently hospitalized status post suicide attempt.  Daughter is now in rehab.  Patient continued continues to live with her husband  Outpatient Medications Prior to Visit  Medication Sig Dispense Refill  . acetaminophen (TYLENOL) 325 MG tablet Take 2 tablets (650 mg total) by mouth every 6 (six) hours as needed for mild pain or headache. You can take 2 tablets every 4 hours as needed.  You can buy this over-the-counter at any drugstore.  You cannot take more than 4000 mg of Tylenol per day.  Your prescribed pain medication includes Tylenol, be sure to count each pill so you do not exceed 4000 mg of Tylenol per day.    Marland Kitchen acetaminophen-codeine (TYLENOL #3) 300-30 MG tablet Take 1-2 tablets by mouth every 4 (four) hours as needed for moderate pain. 15 tablet 0  . aspirin EC 81 MG tablet Take 81 mg by mouth every other day.    . Vitamin D, Ergocalciferol, (DRISDOL) 50000 units CAPS capsule Take 500,000 Units by mouth once a week.    Marland Kitchen amLODipine (NORVASC) 10 MG tablet Take 10 mg by mouth daily.    . rosuvastatin (CRESTOR) 10 MG tablet Take 10 mg by mouth daily.    Marland Kitchen spironolactone (ALDACTONE) 25 MG tablet Take 25 mg by mouth daily.     No facility-administered medications prior to visit.     ROS Review of Systems  Constitutional: Negative.  Negative for chills,  diaphoresis, fatigue, fever and unexpected weight change.  HENT: Negative.   Eyes: Negative.   Respiratory: Negative.   Cardiovascular: Negative.   Gastrointestinal: Negative.   Endocrine: Negative for polyphagia and polyuria.  Genitourinary: Negative.   Musculoskeletal: Negative for gait problem and joint swelling.  Skin: Negative.   Allergic/Immunologic: Negative for immunocompromised state.  Neurological: Negative for weakness and headaches.  Hematological: Negative.   Psychiatric/Behavioral: Negative.     Objective:  BP 132/78   Pulse 96   Ht  (1.549 m)   Wt 213 lb (96.6 kg)   SpO2 95%   BMI 40.25 kg/m   BP Readings from Last 3 Encounters:  06/07/17 132/78  05/22/17 136/64    Wt Readings from Last 3 Encounters:  06/07/17 213 lb (96.6 kg)  05/21/17 215 lb (97.5 kg)    Physical Exam  Constitutional: She is oriented to person, place, and time. She appears well-developed and well-nourished. No distress.  HENT:  Head: Normocephalic and atraumatic.  Right Ear: External ear normal.  Left Ear: External ear normal.  Nose: Nose normal.  Mouth/Throat: Oropharynx is clear and moist. No oropharyngeal exudate.  Eyes: Pupils are equal, round, and reactive to light. Conjunctivae and EOM are normal. Right eye exhibits no discharge. Left eye exhibits no discharge. No scleral icterus.  Neck: Normal range of motion. Neck supple. No  JVD present. No tracheal deviation present. No thyromegaly present.  Cardiovascular: Normal rate and regular rhythm.  Murmur heard. Pulmonary/Chest: Effort normal and breath sounds normal.  Abdominal: Soft. Bowel sounds are normal. She exhibits distension. She exhibits no mass. There is no tenderness. There is no rebound and no guarding.    Lymphadenopathy:    She has no cervical adenopathy.  Neurological: She is alert and oriented to person, place, and time.  Skin: Skin is warm and dry. She is not diaphoretic.  Psychiatric: She has a normal  mood and affect. Her behavior is normal.    Lab Results  Component Value Date   WBC 8.7 05/22/2017   HGB 12.0 05/22/2017   HCT 37.0 05/22/2017   PLT 263 05/22/2017   GLUCOSE 159 (H) 05/22/2017   ALT 506 (H) 05/22/2017   AST 270 (H) 05/22/2017   NA 142 05/22/2017   K 5.1 05/22/2017   CL 108 05/22/2017   CREATININE 0.74 05/22/2017   BUN 8 05/22/2017   CO2 26 05/22/2017    Dg Cholangiogram Operative  Result Date: 05/18/2017 CLINICAL DATA:  Intraoperative cholangiogram during laparoscopic cholecystectomy. EXAM: INTRAOPERATIVE CHOLANGIOGRAM FLUOROSCOPY TIME:  47 seconds COMPARISON:  Right upper quadrant abdominal ultrasound - 05/18/2017 FINDINGS: Intraoperative cholangiographic images of the right upper abdominal quadrant during laparoscopic cholecystectomy are provided for review. Surgical clips overlie the expected location of the gallbladder fossa. There are several ill-defined filling defects within the opacified portion of the neck of the gallbladder compatible with known cholelithiasis. Contrast injection demonstrates selective cannulation of the central aspect of the cystic duct. There is passage of contrast through the central aspect of the cystic duct with filling of a dilated common bile duct. There is no definitive passage of contrast to the level of the duodenum. There is minimal reflux of injected contrast into the common hepatic duct and central aspect of the mildly dilated intrahepatic biliary system. While there are no discrete filling defects within the opacified portions of the biliary system to suggest the presence of choledocholithiasis, however again, there is no definitive opacification of the duodenum. IMPRESSION: Cholelithiasis without evidence of choledocholithiasis, however there is no definitive passage of contrast into the duodenum. Correlation with the operative report is recommended. Further evaluation with ERCP could be performed as indicated. Electronically Signed    By: Simonne Come M.D.   On: 05/18/2017 14:01   US Abdomen Limited Ruq  Result Date: 05/18/2017 CLINICAL DATA:  Acute onset of right upper quadrant abdominal pain. EXAM: ULTRASOUND ABDOMEN LIMITED RIGHT UPPER QUADRANT COMPARISON:  None. FINDINGS: Gallbladder: Stones are noted dependently within the gallbladder. No significant gallbladder wall thickening is noted. Evaluation for ultrasonographic Murphy's sign is suboptimal given that the patient is on pain medication. No pericholecystic fluid is seen. Common bile duct: Diameter: 1.6 cm, raising concern for distal obstruction. No distal stone is identified on this study. Liver: No focal lesion identified. Diffusely increased parenchymal echogenicity and coarsened echotexture raises question for fatty infiltration. Portal vein is patent on color Doppler imaging with normal direction of blood flow towards the liver. IMPRESSION: 1. Cholelithiasis.  No definite evidence for cholecystitis. 2. Significant dilatation of the common bile duct to 1.6 cm, concerning for distal obstruction. No distal stone is characterized on this study. Would correlate with LFTs. 3. Diffuse fatty infiltration within the liver. Electronically Signed   By: Roanna Raider M.D.   On: 05/18/2017 04:08    Assessment & Plan:   Sham was seen today for establish care.  Diagnoses and all orders for this visit:  Essential hypertension -     CBC; Future -     Comprehensive metabolic panel; Future -     Urinalysis, Routine w reflex microscopic; Future -     Microalbumin / creatinine urine ratio; Future -     amLODipine (NORVASC) 10 MG tablet; Take 1 tablet (10 mg total) by mouth daily. -     spironolactone (ALDACTONE) 25 MG tablet; Take 1 tablet (25 mg total) by mouth daily.  Mixed hyperlipidemia -     Comprehensive metabolic panel; Future -     Lipid panel; Future -     rosuvastatin (CRESTOR) 10 MG tablet; Take 1 tablet (10 mg total) by mouth daily.  Recent major surgery -     CBC;  Future -     Comprehensive metabolic panel; Future  Vitamin D deficiency -     VITAMIN D 25 Hydroxy (Vit-D Deficiency, Fractures); Future   I have changed Bertha Garr's amLODipine, rosuvastatin, and spironolactone. I am also having her maintain her aspirin EC, Vitamin D (Ergocalciferol), acetaminophen-codeine, and acetaminophen.  Meds ordered this encounter  Medications  . amLODipine (NORVASC) 10 MG tablet    Sig: Take 1 tablet (10 mg total) by mouth daily.    Dispense:  100 tablet    Refill:  1  . rosuvastatin (CRESTOR) 10 MG tablet    Sig: Take 1 tablet (10 mg total) by mouth daily.    Dispense:  100 tablet    Refill:  1  . spironolactone (ALDACTONE) 25 MG tablet    Sig: Take 1 tablet (25 mg total) by mouth daily.    Dispense:  100 tablet    Refill:  1     Follow-up: Return in about 3 months (around 09/07/2017).  Mliss Sax, MD

## 2017-06-08 MED ORDER — VITAMIN D (ERGOCALCIFEROL) 1.25 MG (50000 UNIT) PO CAPS: 50000.0000 [IU] | ORAL_CAPSULE | ORAL | 2 refills | Status: DC

## 2017-06-08 NOTE — Telephone Encounter (Signed)
Request for refill of Vit D. Pt states this was not filled with other medications.   LOV:06/07/17 Dr. Shela Leff [facility] in [place]

## 2017-06-08 NOTE — Telephone Encounter (Signed)
Rx has been refilled.  

## 2017-09-07 ENCOUNTER — Ambulatory Visit: Payer: Medicare Other | Admitting: Family Medicine

## 2017-09-28 ENCOUNTER — Ambulatory Visit: Payer: Medicare Other | Admitting: Family Medicine

## 2017-10-05 ENCOUNTER — Ambulatory Visit: Payer: Medicare Other | Admitting: Family Medicine

## 2017-10-16 ENCOUNTER — Ambulatory Visit: Payer: Medicare Other | Admitting: Family Medicine

## 2017-10-23 ENCOUNTER — Ambulatory Visit: Payer: Medicare Other | Admitting: Family Medicine

## 2017-11-08 ENCOUNTER — Other Ambulatory Visit: Payer: Self-pay | Admitting: Family Medicine

## 2017-11-09 ENCOUNTER — Ambulatory Visit: Payer: Medicare Other | Admitting: Family Medicine

## 2017-11-16 ENCOUNTER — Ambulatory Visit: Payer: Medicare Other | Admitting: Family Medicine

## 2017-11-23 ENCOUNTER — Ambulatory Visit: Payer: Medicare Other | Admitting: Family Medicine

## 2017-11-27 ENCOUNTER — Other Ambulatory Visit: Payer: Self-pay | Admitting: Family Medicine

## 2017-11-27 ENCOUNTER — Other Ambulatory Visit: Payer: Self-pay

## 2017-11-27 DIAGNOSIS — E782 Mixed hyperlipidemia: Secondary | ICD-10-CM

## 2017-11-27 DIAGNOSIS — I10 Essential (primary) hypertension: Secondary | ICD-10-CM

## 2017-11-27 MED ORDER — ROSUVASTATIN CALCIUM 10 MG PO TABS: 10.0000 mg | ORAL_TABLET | Freq: Every day | ORAL | 1 refills | Status: DC

## 2017-11-30 ENCOUNTER — Ambulatory Visit: Payer: Medicare Other | Admitting: Family Medicine

## 2017-12-07 ENCOUNTER — Ambulatory Visit: Payer: Medicare Other | Admitting: Family Medicine

## 2017-12-13 ENCOUNTER — Encounter: Payer: Self-pay | Admitting: Family Medicine

## 2017-12-13 ENCOUNTER — Ambulatory Visit: Payer: Medicare Other | Admitting: Family Medicine

## 2017-12-13 ENCOUNTER — Other Ambulatory Visit: Payer: Self-pay | Admitting: Family Medicine

## 2017-12-13 VITALS — BP 130/80 | HR 88 | Ht 61.0 in | Wt 206.0 lb

## 2017-12-13 DIAGNOSIS — Z9889 Other specified postprocedural states: Secondary | ICD-10-CM | POA: Diagnosis not present

## 2017-12-13 DIAGNOSIS — E559 Vitamin D deficiency, unspecified: Secondary | ICD-10-CM

## 2017-12-13 DIAGNOSIS — R7309 Other abnormal glucose: Secondary | ICD-10-CM | POA: Insufficient documentation

## 2017-12-13 DIAGNOSIS — E6609 Other obesity due to excess calories: Secondary | ICD-10-CM | POA: Insufficient documentation

## 2017-12-13 DIAGNOSIS — I10 Essential (primary) hypertension: Secondary | ICD-10-CM

## 2017-12-13 DIAGNOSIS — E782 Mixed hyperlipidemia: Secondary | ICD-10-CM | POA: Diagnosis not present

## 2017-12-13 DIAGNOSIS — E66812 Obesity, class 2: Secondary | ICD-10-CM

## 2017-12-13 DIAGNOSIS — Z6838 Body mass index (BMI) 38.0-38.9, adult: Secondary | ICD-10-CM

## 2017-12-13 MED ORDER — VITAMIN D (ERGOCALCIFEROL) 1.25 MG (50000 UNIT) PO CAPS: 50000.0000 [IU] | ORAL_CAPSULE | ORAL | 1 refills | Status: DC

## 2017-12-13 NOTE — Telephone Encounter (Signed)
Copied from CRM [id number]. Topic: Quick Communication - Rx Refill/Question >> Dec 13, 2017  3:39 PM Jaquita Rectoravis, Karen A wrote: Medication: Vitamin D, Ergocalciferol, (DRISDOL) 50000 units CAPS capsule  Has the patient contacted their pharmacy? Yes.     Preferred Pharmacy (with phone number or street name): [facility] [address] - [place] [address] [phone] (Phone) [phone] (Fax)    Agent: Please be advised that RX refills may take up to 3 business days. We ask that you follow-up with your pharmacy.

## 2017-12-13 NOTE — Progress Notes (Addendum)
Subjective:  Patient ID: Donna [name], female    DOB: [birth year]  Age: 67 y.o. MRN: [id number]  CC: Follow-up   HPI Kerin Kren presents for follow-up of her hypertension and elevated cholesterol vitamin D deficiency.  Blood pressure controlled with her amlodipine and spironolactone.  He continues to take Crestor for her cholesterol and ergocalciferol high-dose for her vitamin D deficiency.  Status post biliary colic with follow-up cholecystectomy back in May of this year.  She is recovered from her surgery and continues to experience loose stools.  Her daughter is in outpatient rehab.  Patient continues to raise her grandchildren who have been doing well in school.  She is nonfasting today.  She is active by keeping up with her grandchildren.  She does not smoke drink alcohol or use illicit drugs.  Outpatient Medications Prior to Visit  Medication Sig Dispense Refill  . amLODipine (NORVASC) 10 MG tablet Take 1 tablet (10 mg total) by mouth daily. 100 tablet 1  . aspirin EC 81 MG tablet Take 81 mg by mouth every other day.    . rosuvastatin (CRESTOR) 10 MG tablet Take 1 tablet (10 mg total) by mouth daily. 100 tablet 1  . spironolactone (ALDACTONE) 25 MG tablet TAKE ONE TABLET BY MOUTH DAILY 90 tablet 1  . Vitamin D, Ergocalciferol, (DRISDOL) 50000 units CAPS capsule TAKE 1 CAPSULE BY MOUTH ONCE WEEKLY 4 capsule 0  . acetaminophen (TYLENOL) 325 MG tablet Take 2 tablets (650 mg total) by mouth every 6 (six) hours as needed for mild pain or headache. You can take 2 tablets every 4 hours as needed.  You can buy this over-the-counter at any drugstore.  You cannot take more than 4000 mg of Tylenol per day.  Your prescribed pain medication includes Tylenol, be sure to count each pill so you do not exceed 4000 mg of Tylenol per day.    Marland Kitchen acetaminophen-codeine (TYLENOL #3) 300-30 MG tablet Take 1-2 tablets by mouth every 4 (four) hours as needed for moderate pain. 15 tablet 0   No facility-administered  medications prior to visit.     ROS Review of Systems  Constitutional: Negative.  Negative for chills, fever and unexpected weight change.  HENT: Negative.   Eyes: Negative for photophobia and visual disturbance.  Respiratory: Negative.   Cardiovascular: Negative.   Gastrointestinal: Positive for diarrhea.  Endocrine: Negative for polyphagia and polyuria.  Musculoskeletal: Negative for gait problem and joint swelling.  Skin: Negative.   Allergic/Immunologic: Negative for immunocompromised state.  Neurological: Negative for light-headedness and headaches.  Hematological: Does not bruise/bleed easily.  Psychiatric/Behavioral: Negative.     Objective:  BP 130/80 (BP Location: Left Arm, Patient Position: Sitting, Cuff Size: Normal)   Pulse 88   Ht 5\' 1"  (1.549 m)   Wt 206 lb (93.4 kg)   SpO2 97%   BMI 38.92 kg/m   BP Readings from Last 3 Encounters:  12/13/17 130/80  06/07/17 132/78  05/22/17 136/64    Wt Readings from Last 3 Encounters:  12/13/17 206 lb (93.4 kg)  06/07/17 213 lb (96.6 kg)  05/21/17 215 lb (97.5 kg)    Physical Exam  Constitutional: She is oriented to person, place, and time. She appears well-developed and well-nourished. No distress.  HENT:  Head: Normocephalic and atraumatic.  Right Ear: External ear normal.  Left Ear: External ear normal.  Mouth/Throat: Oropharynx is clear and moist. No oropharyngeal exudate.  Eyes: Pupils are equal, round, and reactive to light. EOM are normal. Right eye  exhibits no discharge. Left eye exhibits no discharge.  Neck: No JVD present. No tracheal deviation present. No thyromegaly present.  Cardiovascular: Normal rate, regular rhythm and normal heart sounds.  Pulmonary/Chest: Effort normal and breath sounds normal.  Abdominal: Soft. Bowel sounds are normal. She exhibits distension. She exhibits no mass. There is no tenderness. There is no rebound and no guarding.  Lymphadenopathy:    She has no cervical adenopathy.    Neurological: She is alert and oriented to person, place, and time.  Skin: Skin is warm and dry. She is not diaphoretic.     Psychiatric: She has a normal mood and affect. Her behavior is normal.    Lab Results  Component Value Date   WBC 8.7 05/22/2017   HGB 12.0 05/22/2017   HCT 37.0 05/22/2017   PLT 263 05/22/2017   GLUCOSE 159 (H) 05/22/2017   ALT 506 (H) 05/22/2017   AST 270 (H) 05/22/2017   NA 142 05/22/2017   K 5.1 05/22/2017   CL 108 05/22/2017   CREATININE 0.74 05/22/2017   BUN 8 05/22/2017   CO2 26 05/22/2017    Dg Cholangiogram Operative  Result Date: 05/18/2017 CLINICAL DATA:  Intraoperative cholangiogram during laparoscopic cholecystectomy. EXAM: INTRAOPERATIVE CHOLANGIOGRAM FLUOROSCOPY TIME:  47 seconds COMPARISON:  Right upper quadrant abdominal ultrasound - 05/18/2017 FINDINGS: Intraoperative cholangiographic images of the right upper abdominal quadrant during laparoscopic cholecystectomy are provided for review. Surgical clips overlie the expected location of the gallbladder fossa. There are several ill-defined filling defects within the opacified portion of the neck of the gallbladder compatible with known cholelithiasis. Contrast injection demonstrates selective cannulation of the central aspect of the cystic duct. There is passage of contrast through the central aspect of the cystic duct with filling of a dilated common bile duct. There is no definitive passage of contrast to the level of the duodenum. There is minimal reflux of injected contrast into the common hepatic duct and central aspect of the mildly dilated intrahepatic biliary system. While there are no discrete filling defects within the opacified portions of the biliary system to suggest the presence of choledocholithiasis, however again, there is no definitive opacification of the duodenum. IMPRESSION: Cholelithiasis without evidence of choledocholithiasis, however there is no definitive passage of contrast  into the duodenum. Correlation with the operative report is recommended. Further evaluation with ERCP could be performed as indicated. Electronically Signed   By: Simonne ComeJohn  Watts M.D.   On: 05/18/2017 14:01   [place] Abdomen Limited Ruq  Result Date: 05/18/2017 CLINICAL DATA:  Acute onset of right upper quadrant abdominal pain. EXAM: ULTRASOUND ABDOMEN LIMITED RIGHT UPPER QUADRANT COMPARISON:  None. FINDINGS: Gallbladder: Stones are noted dependently within the gallbladder. No significant gallbladder wall thickening is noted. Evaluation for ultrasonographic Murphy's sign is suboptimal given that the patient is on pain medication. No pericholecystic fluid is seen. Common bile duct: Diameter: 1.6 cm, raising concern for distal obstruction. No distal stone is identified on this study. Liver: No focal lesion identified. Diffusely increased parenchymal echogenicity and coarsened echotexture raises question for fatty infiltration. Portal vein is patent on color Doppler imaging with normal direction of blood flow towards the liver. IMPRESSION: 1. Cholelithiasis.  No definite evidence for cholecystitis. 2. Significant dilatation of the common bile duct to 1.6 cm, concerning for distal obstruction. No distal stone is characterized on this study. Would correlate with LFTs. 3. Diffuse fatty infiltration within the liver. Electronically Signed   By: Roanna RaiderJeffery  Chang M.D.   On: 05/18/2017 04:08  Assessment & Plan:   Keshawna was seen today for follow-up.  Diagnoses and all orders for this visit:  Essential hypertension  Mixed hyperlipidemia  Recent major surgery  Vitamin D deficiency  Elevated glucose -     Cancel: Hemoglobin A1c -     Hemoglobin A1c; Future  Class 2 obesity due to excess calories with body mass index (BMI) of 38.0 to 38.9 in adult, unspecified whether serious comorbidity present   I have discontinued Brandye Leece's acetaminophen-codeine and acetaminophen. I am also having her maintain her aspirin  EC, amLODipine, Vitamin D (Ergocalciferol), spironolactone, and rosuvastatin.  No orders of the defined types were placed in this encounter.  Patient will continue all medicines as above.  She will follow-up fasting for above ordered blood work.  Patient was given information on the DASH diet and exercising to lose weight. Follow-up: Return in about 3 months (around 03/15/2018).  Mliss Sax, MD

## 2017-12-13 NOTE — Patient Instructions (Signed)
DASH Eating Plan DASH stands for "Dietary Approaches to Stop Hypertension." The DASH eating plan is a healthy eating plan that has been shown to reduce high blood pressure (hypertension). It may also reduce your risk for type 2 diabetes, heart disease, and stroke. The DASH eating plan may also help with weight loss. What are tips for following this plan? General guidelines  Avoid eating more than 2,300 mg (milligrams) of salt (sodium) a day. If you have hypertension, you may need to reduce your sodium intake to 1,500 mg a day.  Limit alcohol intake to no more than 1 drink a day for nonpregnant women and 2 drinks a day for men. One drink equals 12 oz of beer, 5 oz of wine, or 1 oz of hard liquor.  Work with your health care provider to maintain a healthy body weight or to lose weight. Ask what an ideal weight is for you.  Get at least 30 minutes of exercise that causes your heart to beat faster (aerobic exercise) most days of the week. Activities may include walking, swimming, or biking.  Work with your health care provider or diet and nutrition specialist (dietitian) to adjust your eating plan to your individual calorie needs. Reading food labels  Check food labels for the amount of sodium per serving. Choose foods with less than 5 percent of the Daily Value of sodium. Generally, foods with less than 300 mg of sodium per serving fit into this eating plan.  To find whole grains, look for the word "whole" as the first word in the ingredient list. Shopping  Buy products labeled as "low-sodium" or "no salt added."  Buy fresh foods. Avoid canned foods and premade or frozen meals. Cooking  Avoid adding salt when cooking. Use salt-free seasonings or herbs instead of table salt or sea salt. Check with your health care provider or pharmacist before using salt substitutes.  Do not fry foods. Cook foods using healthy methods such as baking, boiling, grilling, and broiling instead.  Cook with  heart-healthy oils, such as olive, canola, soybean, or sunflower oil. Meal planning   Eat a balanced diet that includes: ? 5 or more servings of fruits and vegetables each day. At each meal, try to fill half of your plate with fruits and vegetables. ? Up to 6-8 servings of whole grains each day. ? Less than 6 oz of lean meat, poultry, or fish each day. A 3-oz serving of meat is about the same size as a deck of cards. One egg equals 1 oz. ? 2 servings of low-fat dairy each day. ? A serving of nuts, seeds, or beans 5 times each week. ? Heart-healthy fats. Healthy fats called Omega-3 fatty acids are found in foods such as flaxseeds and coldwater fish, like sardines, salmon, and mackerel.  Limit how much you eat of the following: ? Canned or prepackaged foods. ? Food that is high in trans fat, such as fried foods. ? Food that is high in saturated fat, such as fatty meat. ? Sweets, desserts, sugary drinks, and other foods with added sugar. ? Full-fat dairy products.  Do not salt foods before eating.  Try to eat at least 2 vegetarian meals each week.  Eat more home-cooked food and less restaurant, buffet, and fast food.  When eating at a restaurant, ask that your food be prepared with less salt or no salt, if possible. What foods are recommended? The items listed may not be a complete list. Talk with your dietitian about what   dietary choices are best for you. Grains Whole-grain or whole-wheat bread. Whole-grain or whole-wheat pasta. Brown rice. Oatmeal. Quinoa. Bulgur. Whole-grain and low-sodium cereals. Pita bread. Low-fat, low-sodium crackers. Whole-wheat flour tortillas. Vegetables Fresh or frozen vegetables (raw, steamed, roasted, or grilled). Low-sodium or reduced-sodium tomato and vegetable juice. Low-sodium or reduced-sodium tomato sauce and tomato paste. Low-sodium or reduced-sodium canned vegetables. Fruits All fresh, dried, or frozen fruit. Canned fruit in natural juice (without  added sugar). Meat and other protein foods Skinless chicken or turkey. Ground chicken or turkey. Pork with fat trimmed off. Fish and seafood. Egg whites. Dried beans, peas, or lentils. Unsalted nuts, nut butters, and seeds. Unsalted canned beans. Lean cuts of beef with fat trimmed off. Low-sodium, lean deli meat. Dairy Low-fat (1%) or fat-free (skim) milk. Fat-free, low-fat, or reduced-fat cheeses. Nonfat, low-sodium ricotta or cottage cheese. Low-fat or nonfat yogurt. Low-fat, low-sodium cheese. Fats and oils Soft margarine without trans fats. Vegetable oil. Low-fat, reduced-fat, or light mayonnaise and salad dressings (reduced-sodium). Canola, safflower, olive, soybean, and sunflower oils. Avocado. Seasoning and other foods Herbs. Spices. Seasoning mixes without salt. Unsalted popcorn and pretzels. Fat-free sweets. What foods are not recommended? The items listed may not be a complete list. Talk with your dietitian about what dietary choices are best for you. Grains Baked goods made with fat, such as croissants, muffins, or some breads. Dry pasta or rice meal packs. Vegetables Creamed or fried vegetables. Vegetables in a cheese sauce. Regular canned vegetables (not low-sodium or reduced-sodium). Regular canned tomato sauce and paste (not low-sodium or reduced-sodium). Regular tomato and vegetable juice (not low-sodium or reduced-sodium). Pickles. Olives. Fruits Canned fruit in a light or heavy syrup. Fried fruit. Fruit in cream or butter sauce. Meat and other protein foods Fatty cuts of meat. Ribs. Fried meat. Bacon. Sausage. Bologna and other processed lunch meats. Salami. Fatback. Hotdogs. Bratwurst. Salted nuts and seeds. Canned beans with added salt. Canned or smoked fish. Whole eggs or egg yolks. Chicken or turkey with skin. Dairy Whole or 2% milk, cream, and half-and-half. Whole or full-fat cream cheese. Whole-fat or sweetened yogurt. Full-fat cheese. Nondairy creamers. Whipped toppings.  Processed cheese and cheese spreads. Fats and oils Butter. Stick margarine. Lard. Shortening. Ghee. Bacon fat. Tropical oils, such as coconut, palm kernel, or palm oil. Seasoning and other foods Salted popcorn and pretzels. Onion salt, garlic salt, seasoned salt, table salt, and sea salt. Worcestershire sauce. Tartar sauce. Barbecue sauce. Teriyaki sauce. Soy sauce, including reduced-sodium. Steak sauce. Canned and packaged gravies. Fish sauce. Oyster sauce. Cocktail sauce. Horseradish that you find on the shelf. Ketchup. Mustard. Meat flavorings and tenderizers. Bouillon cubes. Hot sauce and Tabasco sauce. Premade or packaged marinades. Premade or packaged taco seasonings. Relishes. Regular salad dressings. Where to find more information:  National Heart, Lung, and Blood Institute: www.nhlbi.nih.gov  American Heart Association: www.heart.org Summary  The DASH eating plan is a healthy eating plan that has been shown to reduce high blood pressure (hypertension). It may also reduce your risk for type 2 diabetes, heart disease, and stroke.  With the DASH eating plan, you should limit salt (sodium) intake to 2,300 mg a day. If you have hypertension, you may need to reduce your sodium intake to 1,500 mg a day.  When on the DASH eating plan, aim to eat more fresh fruits and vegetables, whole grains, lean proteins, low-fat dairy, and heart-healthy fats.  Work with your health care provider or diet and nutrition specialist (dietitian) to adjust your eating plan to your individual   calorie needs. This information is not intended to replace advice given to you by your health care provider. Make sure you discuss any questions you have with your health care provider. Document Released: 01/05/2011 Document Revised: 01/10/2016 Document Reviewed: 01/10/2016 Elsevier Interactive Patient Education  2018 Elsevier Inc.  Exercising to Lose Weight Exercising can help you to lose weight. In order to lose weight  through exercise, you need to do vigorous-intensity exercise. You can tell that you are exercising with vigorous intensity if you are breathing very hard and fast and cannot hold a conversation while exercising. Moderate-intensity exercise helps to maintain your current weight. You can tell that you are exercising at a moderate level if you have a higher heart rate and faster breathing, but you are still able to hold a conversation. How often should I exercise? Choose an activity that you enjoy and set realistic goals. Your health care provider can help you to make an activity plan that works for you. Exercise regularly as directed by your health care provider. This may include:  Doing resistance training twice each week, such as: ? Push-ups. ? Sit-ups. ? Lifting weights. ? Using resistance bands.  Doing a given intensity of exercise for a given amount of time. Choose from these options: ? 150 minutes of moderate-intensity exercise every week. ? 75 minutes of vigorous-intensity exercise every week. ? A mix of moderate-intensity and vigorous-intensity exercise every week.  Children, pregnant women, people who are out of shape, people who are overweight, and older adults may need to consult a health care provider for individual recommendations. If you have any sort of medical condition, be sure to consult your health care provider before starting a new exercise program. What are some activities that can help me to lose weight?  Walking at a rate of at least 4.5 miles an hour.  Jogging or running at a rate of 5 miles per hour.  Biking at a rate of at least 10 miles per hour.  Lap swimming.  Roller-skating or in-line skating.  Cross-country skiing.  Vigorous competitive sports, such as football, basketball, and soccer.  Jumping rope.  Aerobic dancing. How can I be more active in my day-to-day activities?  Use the stairs instead of the elevator.  Take a walk during your lunch  break.  If you drive, park your car farther away from work or school.  If you take public transportation, get off one stop early and walk the rest of the way.  Make all of your phone calls while standing up and walking around.  Get up, stretch, and walk around every 30 minutes throughout the day. What guidelines should I follow while exercising?  Do not exercise so much that you hurt yourself, feel dizzy, or get very short of breath.  Consult your health care provider prior to starting a new exercise program.  Wear comfortable clothes and shoes with good support.  Drink plenty of water while you exercise to prevent dehydration or heat stroke. Body water is lost during exercise and must be replaced.  Work out until you breathe faster and your heart beats faster. This information is not intended to replace advice given to you by your health care provider. Make sure you discuss any questions you have with your health care provider. Document Released: 02/18/2010 Document Revised: 06/24/2015 Document Reviewed: 06/19/2013 Elsevier Interactive Patient Education  2018 Elsevier Inc.  

## 2017-12-13 NOTE — Telephone Encounter (Signed)
Copied from CRM [id number]. Topic: Quick Communication - Rx Refill/Question >> Dec 13, 2017  2:24 PM Lynne LoganHudson, Caryn D wrote: Medication: Vitamin D, Ergocalciferol, (DRISDOL) 50000 units CAPS capsule  Has the patient contacted their pharmacy? No. (Agent: If no, request that the patient contact the pharmacy for the refill.) (Agent: If yes, when and what did the pharmacy advise?)  Preferred Pharmacy (with phone number or street name): [facility] [address] - [place], [place] [address] [phone] (Phone) [phone] (Fax)    Agent: Please be advised that RX refills may take up to 3 business days. We ask that you follow-up with your pharmacy.

## 2017-12-21 ENCOUNTER — Other Ambulatory Visit: Payer: Medicare Other

## 2018-01-03 ENCOUNTER — Other Ambulatory Visit: Payer: Medicare Other

## 2018-02-01 ENCOUNTER — Other Ambulatory Visit: Payer: Self-pay | Admitting: Family Medicine

## 2018-02-01 DIAGNOSIS — I10 Essential (primary) hypertension: Secondary | ICD-10-CM

## 2018-02-01 MED ORDER — AMLODIPINE BESYLATE 10 MG PO TABS: 10.0000 mg | ORAL_TABLET | Freq: Every day | ORAL | 2 refills | Status: DC

## 2018-02-01 NOTE — Telephone Encounter (Signed)
Copied from CRM [id number]. Topic: Quick Communication - Rx Refill/Question >> Feb 01, 2018  1:33 PM Angela Nevin wrote: Medication: amLODipine (NORVASC) 10 MG tablet   Patient is requesting a refill of this medication. Patient is out of tablets.   Preferred Pharmacy (with phone number or street name):[facility] [address] - [place], [place] - [address]  [phone] (Phone) [phone] (Fax)

## 2018-02-08 ENCOUNTER — Other Ambulatory Visit: Payer: Medicare Other

## 2018-03-13 ENCOUNTER — Other Ambulatory Visit: Payer: Self-pay | Admitting: Family Medicine

## 2018-03-15 ENCOUNTER — Ambulatory Visit: Payer: Medicare Other | Admitting: Family Medicine

## 2018-05-22 ENCOUNTER — Other Ambulatory Visit: Payer: Self-pay | Admitting: Family Medicine

## 2018-05-22 DIAGNOSIS — E782 Mixed hyperlipidemia: Secondary | ICD-10-CM

## 2018-05-22 DIAGNOSIS — I10 Essential (primary) hypertension: Secondary | ICD-10-CM

## 2018-07-28 ENCOUNTER — Other Ambulatory Visit: Payer: Self-pay | Admitting: Family Medicine

## 2018-07-28 DIAGNOSIS — E782 Mixed hyperlipidemia: Secondary | ICD-10-CM

## 2018-07-28 DIAGNOSIS — I10 Essential (primary) hypertension: Secondary | ICD-10-CM

## 2018-08-01 ENCOUNTER — Other Ambulatory Visit: Payer: Self-pay | Admitting: Family Medicine

## 2018-08-03 NOTE — Telephone Encounter (Signed)
Needs appointment

## 2018-09-27 ENCOUNTER — Other Ambulatory Visit: Payer: Self-pay

## 2018-09-27 DIAGNOSIS — E782 Mixed hyperlipidemia: Secondary | ICD-10-CM

## 2018-09-27 DIAGNOSIS — I10 Essential (primary) hypertension: Secondary | ICD-10-CM

## 2018-09-27 MED ORDER — ROSUVASTATIN CALCIUM 10 MG PO TABS: 10.0000 mg | ORAL_TABLET | Freq: Every day | ORAL | 1 refills | Status: DC

## 2018-09-27 MED ORDER — VITAMIN D (ERGOCALCIFEROL) 1.25 MG (50000 UNIT) PO CAPS: 50000.0000 [IU] | ORAL_CAPSULE | ORAL | 3 refills | Status: DC

## 2018-09-27 MED ORDER — SPIRONOLACTONE 25 MG PO TABS: 25.0000 mg | ORAL_TABLET | Freq: Every day | ORAL | 1 refills | Status: DC

## 2018-09-27 MED ORDER — AMLODIPINE BESYLATE 10 MG PO TABS: 10.0000 mg | ORAL_TABLET | Freq: Every day | ORAL | 1 refills | Status: DC

## 2019-03-17 ENCOUNTER — Other Ambulatory Visit: Payer: Self-pay | Admitting: Family Medicine

## 2019-03-18 ENCOUNTER — Telehealth: Payer: Self-pay | Admitting: Family Medicine

## 2019-03-18 NOTE — Telephone Encounter (Signed)
Patient is calling and requesting a call back. CB is [phone]

## 2019-03-26 NOTE — Telephone Encounter (Signed)
Patient calling for a refill on Vitamin D. Offered an appointment for a follow up visit patient have not been seen since November 2019. Per patient she have not been in because her husband have been sick and she does not want to get him sick. Would you like to do a virtual visit? Patient aware that she will need blood work done but she does not know when she will not be afraid to come in. Please advise.

## 2019-03-27 NOTE — Telephone Encounter (Signed)
Needs to be seen in the office when she is feeling better. Please schedule.

## 2019-04-23 ENCOUNTER — Ambulatory Visit (INDEPENDENT_AMBULATORY_CARE_PROVIDER_SITE_OTHER): Payer: Medicare Other | Admitting: Otolaryngology

## 2019-04-24 ENCOUNTER — Other Ambulatory Visit: Payer: Self-pay | Admitting: Family Medicine

## 2019-04-24 DIAGNOSIS — I10 Essential (primary) hypertension: Secondary | ICD-10-CM

## 2019-04-24 DIAGNOSIS — E782 Mixed hyperlipidemia: Secondary | ICD-10-CM

## 2019-05-01 ENCOUNTER — Ambulatory Visit (INDEPENDENT_AMBULATORY_CARE_PROVIDER_SITE_OTHER): Payer: Medicare Other | Admitting: Otolaryngology

## 2019-05-01 ENCOUNTER — Other Ambulatory Visit: Payer: Self-pay

## 2019-05-01 ENCOUNTER — Encounter (INDEPENDENT_AMBULATORY_CARE_PROVIDER_SITE_OTHER): Payer: Self-pay | Admitting: Otolaryngology

## 2019-05-01 VITALS — Temp 98.1°F

## 2019-05-01 DIAGNOSIS — H6123 Impacted cerumen, bilateral: Secondary | ICD-10-CM

## 2019-05-01 NOTE — Progress Notes (Signed)
HPI: Donna [name] is a 69 y.o. female who presents for evaluation of wax buildup in her ears.  The left ear has been bubbling some.  No real hearing problems..  Past Medical History:  Diagnosis Date  . Essential hypertension 05/22/2017   Past Surgical History:  Procedure Laterality Date  . CHOLECYSTECTOMY N/A 05/18/2017   Procedure: LAPAROSCOPIC CHOLECYSTECTOMY WITH INTRAOPERATIVE CHOLANGIOGRAM;  Surgeon: Griselda Miner, MD;  Location: [facility];  Service: General;  Laterality: N/A;  . ENDOSCOPIC RETROGRADE CHOLANGIOPANCREATOGRAPHY (ERCP) WITH PROPOFOL N/A 05/21/2017   Procedure: ENDOSCOPIC RETROGRADE CHOLANGIOPANCREATOGRAPHY (ERCP) WITH PROPOFOL;  Surgeon: Vida Rigger, MD;  Location: [facility];  Service: Endoscopy;  Laterality: N/A;   Social History   Socioeconomic History  . Marital status: Married    Spouse name: Not on file  . Number of children: Not on file  . Years of education: Not on file  . Highest education level: Not on file  Occupational History  . Not on file  Tobacco Use  . Smoking status: Never Smoker  . Smokeless tobacco: Never Used  Substance and Sexual Activity  . Alcohol use: Not on file  . Drug use: Not on file  . Sexual activity: Not on file  Other Topics Concern  . Not on file  Social History Narrative  . Not on file   Social Determinants of Health   Financial Resource Strain:   . Difficulty of Paying Living Expenses:   Food Insecurity:   . Worried About Programme researcher, broadcasting/film/video in the Last Year:   . Barista in the Last Year:   Transportation Needs:   . Freight forwarder (Medical):   Marland Kitchen Lack of Transportation (Non-Medical):   Physical Activity:   . Days of Exercise per Week:   . Minutes of Exercise per Session:   Stress:   . Feeling of Stress :   Social Connections:   . Frequency of Communication with Friends and Family:   . Frequency of Social Gatherings with Friends and Family:   . Attends Religious Services:   . Active Member of Clubs  or Organizations:   . Attends Banker Meetings:   Marland Kitchen Marital Status:    No family history on file. Allergies  Allergen Reactions  . Penicillins     Unsure of the reaction- happened around age 57   Prior to Admission medications   Medication Sig Start Date End Date Taking? Authorizing Provider  amLODipine (NORVASC) 10 MG tablet Take 1 tablet (10 mg total) by mouth daily. 09/27/18  Yes Mliss Sax, MD  aspirin EC 81 MG tablet Take 81 mg by mouth every other day. 08/25/16  Yes [provider]  rosuvastatin (CRESTOR) 10 MG tablet Take 1 tablet (10 mg total) by mouth daily. 09/27/18  Yes Mliss Sax, MD  spironolactone (ALDACTONE) 25 MG tablet Take 1 tablet (25 mg total) by mouth daily. 09/27/18  Yes Mliss Sax, MD  Vitamin D, Ergocalciferol, (DRISDOL) 1.25 MG (50000 UT) CAPS capsule Take 1 capsule (50,000 Units total) by mouth once a week. 09/27/18  Yes Mliss Sax, MD     Positive ROS: Otherwise negative  All other systems have been reviewed and were otherwise negative with the exception of those mentioned in the HPI and as above.  Physical Exam: Constitutional: Alert, well-appearing, no acute distress Ears: External ears without lesions or tenderness. Ear canals show mild amount of wax in both ear canals that was cleaned with curettes.  Ear  canals and TMs are otherwise clear.. Nasal: External nose without lesions. Clear nasal passages Oral: Oropharynx clear. Neck: No palpable adenopathy or masses Respiratory: Breathing comfortably  Skin: No facial/neck lesions or rash noted.  Cerumen impaction removal  Date/Time: 05/01/2019 3:08 PM Performed by: Rozetta Nunnery, MD Authorized by: Rozetta Nunnery, MD   Consent:    Consent obtained:  Verbal   Consent given by:  Patient   Risks discussed:  Pain and bleeding Procedure details:    Location:  L ear and R ear   Procedure type: curette   Post-procedure details:     Inspection:  TM intact and canal normal   Hearing quality:  Improved   Patient tolerance of procedure:  Tolerated well, no immediate complications Comments:     TMs are clear bilaterally    Assessment: Cerumen buildup  Plan: She will follow-up as needed  Radene Journey, MD

## 2019-05-02 ENCOUNTER — Other Ambulatory Visit: Payer: Self-pay | Admitting: Family Medicine

## 2019-05-02 DIAGNOSIS — I10 Essential (primary) hypertension: Secondary | ICD-10-CM

## 2019-05-02 DIAGNOSIS — E782 Mixed hyperlipidemia: Secondary | ICD-10-CM

## 2019-05-07 ENCOUNTER — Other Ambulatory Visit: Payer: Self-pay

## 2019-05-08 ENCOUNTER — Ambulatory Visit: Payer: Medicare Other | Admitting: Family Medicine

## 2019-05-19 ENCOUNTER — Other Ambulatory Visit: Payer: Self-pay

## 2019-05-20 ENCOUNTER — Ambulatory Visit (INDEPENDENT_AMBULATORY_CARE_PROVIDER_SITE_OTHER): Payer: Medicare Other | Admitting: Family Medicine

## 2019-05-20 ENCOUNTER — Telehealth: Payer: Self-pay | Admitting: Family Medicine

## 2019-05-20 ENCOUNTER — Encounter: Payer: Self-pay | Admitting: Family Medicine

## 2019-05-20 VITALS — BP 164/82 | HR 80 | Temp 97.7°F | Ht 61.0 in | Wt 205.4 lb

## 2019-05-20 DIAGNOSIS — E782 Mixed hyperlipidemia: Secondary | ICD-10-CM

## 2019-05-20 DIAGNOSIS — E559 Vitamin D deficiency, unspecified: Secondary | ICD-10-CM

## 2019-05-20 DIAGNOSIS — Z9119 Patient's noncompliance with other medical treatment and regimen: Secondary | ICD-10-CM

## 2019-05-20 DIAGNOSIS — R7309 Other abnormal glucose: Secondary | ICD-10-CM | POA: Diagnosis not present

## 2019-05-20 DIAGNOSIS — I10 Essential (primary) hypertension: Secondary | ICD-10-CM

## 2019-05-20 DIAGNOSIS — Z91199 Patient's noncompliance with other medical treatment and regimen due to unspecified reason: Secondary | ICD-10-CM | POA: Insufficient documentation

## 2019-05-20 LAB — COMPREHENSIVE METABOLIC PANEL
ALT: 12 U/L (ref 0–35)
AST: 16 U/L (ref 0–37)
Albumin: 4.5 g/dL (ref 3.5–5.2)
Alkaline Phosphatase: 68 U/L (ref 39–117)
BUN: 13 mg/dL (ref 6–23)
CO2: 25 mEq/L (ref 19–32)
Calcium: 9.1 mg/dL (ref 8.4–10.5)
Chloride: 104 mEq/L (ref 96–112)
Creatinine, Ser: 0.69 mg/dL (ref 0.40–1.20)
GFR: 83.94 mL/min (ref >60.00)
Glucose, Bld: 105 mg/dL — ABNORMAL HIGH (ref 70–99)
Potassium: 3.9 mEq/L (ref 3.5–5.1)
Sodium: 136 mEq/L (ref 135–145)
Total Bilirubin: 1.4 mg/dL — ABNORMAL HIGH (ref 0.2–1.2)
Total Protein: 7.7 g/dL (ref 6.0–8.3)

## 2019-05-20 LAB — LDL CHOLESTEROL, DIRECT: Direct LDL: 104 mg/dL

## 2019-05-20 LAB — LIPID PANEL
Cholesterol: 178 mg/dL (ref 0–200)
HDL: 56.9 mg/dL (ref >39.00)
LDL Cholesterol: 91 mg/dL (ref 0–99)
NonHDL: 120.74
Total CHOL/HDL Ratio: 3
Triglycerides: 147 mg/dL (ref 0.0–149.0)
VLDL: 29.4 mg/dL (ref 0.0–40.0)

## 2019-05-20 LAB — CBC
HCT: 42.1 % (ref 36.0–46.0)
Hemoglobin: 14.4 g/dL (ref 12.0–15.0)
MCHC: 34.1 g/dL (ref 30.0–36.0)
MCV: 87.1 fl (ref 78.0–100.0)
Platelets: 264 10*3/uL (ref 150.0–400.0)
RBC: 4.83 Mil/uL (ref 3.87–5.11)
RDW: 13.5 % (ref 11.5–15.5)
WBC: 7.8 10*3/uL (ref 4.0–10.5)

## 2019-05-20 LAB — HEMOGLOBIN A1C: Hgb A1c MFr Bld: 5.7 % (ref 4.6–6.5)

## 2019-05-20 LAB — VITAMIN D 25 HYDROXY (VIT D DEFICIENCY, FRACTURES): VITD: 49.58 ng/mL (ref 30.00–100.00)

## 2019-05-20 MED ORDER — SPIRONOLACTONE 25 MG PO TABS: 25.0000 mg | ORAL_TABLET | Freq: Every day | ORAL | 0 refills | Status: DC

## 2019-05-20 MED ORDER — AMLODIPINE BESYLATE 10 MG PO TABS: 10.0000 mg | ORAL_TABLET | Freq: Every day | ORAL | 0 refills | Status: DC

## 2019-05-20 MED ORDER — LISINOPRIL 20 MG PO TABS: 20.0000 mg | ORAL_TABLET | Freq: Every day | ORAL | 0 refills | Status: DC

## 2019-05-20 NOTE — Telephone Encounter (Signed)
Patient is calling and requesting a call back regarding today's visit. CB is [phone]

## 2019-05-20 NOTE — Progress Notes (Addendum)
Established Patient Office Visit  Subjective:  Patient ID: Donna [name], female    DOB: 09-02-1950  Age: 69 y.o. MRN: [id number]  CC:  Chief Complaint  Patient presents with  . Follow-up    refill/follow up on medication, no concerns    HPI Donna [name] presents for follow-up of her blood pressure, elevated cholesterol vitamin D deficiency.  She has been lost to follow-up for over a year now.  She tells me that her husband has been ill.  She continues to raise her grandson children.  Unfortunately her lack of follow-up is a clear pattern.  She is a noncompliant patient.  She tells me she has been taking her medicines as directed here over the last 2 to 3 weeks.  Past Medical History:  Diagnosis Date  . Essential hypertension 05/22/2017    Past Surgical History:  Procedure Laterality Date  . CHOLECYSTECTOMY N/A 05/18/2017   Procedure: LAPAROSCOPIC CHOLECYSTECTOMY WITH INTRAOPERATIVE CHOLANGIOGRAM;  Surgeon: Jovita Kussmaul, MD;  Location: [facility];  Service: General;  Laterality: N/A;  . ENDOSCOPIC RETROGRADE CHOLANGIOPANCREATOGRAPHY (ERCP) WITH PROPOFOL N/A 05/21/2017   Procedure: ENDOSCOPIC RETROGRADE CHOLANGIOPANCREATOGRAPHY (ERCP) WITH PROPOFOL;  Surgeon: Clarene Essex, MD;  Location: [facility];  Service: Endoscopy;  Laterality: N/A;    History reviewed. No pertinent family history.  Social History   Socioeconomic History  . Marital status: Married    Spouse name: Not on file  . Number of children: Not on file  . Years of education: Not on file  . Highest education level: Not on file  Occupational History  . Not on file  Tobacco Use  . Smoking status: Never Smoker  . Smokeless tobacco: Never Used  Substance and Sexual Activity  . Alcohol use: Never  . Drug use: Never  . Sexual activity: Not on file  Other Topics Concern  . Not on file  Social History Narrative  . Not on file   Social Determinants of Health   Financial Resource Strain:   . Difficulty of Paying  Living Expenses:   Food Insecurity:   . Worried About Charity fundraiser in the Last Year:   . Arboriculturist in the Last Year:   Transportation Needs:   . Film/video editor (Medical):   Marland Kitchen Lack of Transportation (Non-Medical):   Physical Activity:   . Days of Exercise per Week:   . Minutes of Exercise per Session:   Stress:   . Feeling of Stress :   Social Connections:   . Frequency of Communication with Friends and Family:   . Frequency of Social Gatherings with Friends and Family:   . Attends Religious Services:   . Active Member of Clubs or Organizations:   . Attends Archivist Meetings:   Marland Kitchen Marital Status:   Intimate Partner Violence:   . Fear of Current or Ex-Partner:   . Emotionally Abused:   Marland Kitchen Physically Abused:   . Sexually Abused:     Outpatient Medications Prior to Visit  Medication Sig Dispense Refill  . aspirin EC 81 MG tablet Take 81 mg by mouth every other day.    . rosuvastatin (CRESTOR) 10 MG tablet TAKE ONE TABLET BY MOUTH DAILY. 30 tablet 0  . Vitamin D, Ergocalciferol, (DRISDOL) 1.25 MG (50000 UNIT) CAPS capsule TAKE 1 CAPSULE BY MOUTH ONCE A WEEK. 4 capsule 2  . amLODipine (NORVASC) 10 MG tablet TAKE ONE TABLET BY MOUTH DAILY 30 tablet 0  . spironolactone (ALDACTONE) 25 MG tablet  TAKE ONE TABLET BY MOUTH DAILY. 30 tablet 0   No facility-administered medications prior to visit.    Allergies  Allergen Reactions  . Penicillins     Unsure of the reaction- happened around age 47    ROS Review of Systems  Constitutional: Negative.   HENT: Negative.   Eyes: Negative for photophobia and visual disturbance.  Respiratory: Negative.   Cardiovascular: Negative.   Gastrointestinal: Negative.   Genitourinary: Negative.   Musculoskeletal: Negative for gait problem and joint swelling.  Skin: Negative for pallor.  Allergic/Immunologic: Negative for immunocompromised state.  Neurological: Negative for light-headedness and numbness.   Hematological: Does not bruise/bleed easily.  Psychiatric/Behavioral: Negative.    Depression screen [facility] 2/9 05/20/2019  Decreased Interest 0  Down, Depressed, Hopeless 1  PHQ - 2 Score 1  Altered sleeping 0  Tired, decreased energy 1  Change in appetite 0  Feeling bad or failure about yourself  0  Trouble concentrating 0  Moving slowly or fidgety/restless 0  Suicidal thoughts 0  PHQ-9 Score 2  Difficult doing work/chores Not difficult at all      Objective:    Physical Exam  Constitutional: She is oriented to person, place, and time. She appears well-developed and well-nourished. No distress.  HENT:  Head: Normocephalic and atraumatic.  Right Ear: External ear normal.  Left Ear: External ear normal.  Eyes: Conjunctivae are normal. Right eye exhibits no discharge. Left eye exhibits no discharge. No scleral icterus.  Neck: No JVD present. No tracheal deviation present. No thyromegaly present.  Cardiovascular: Normal rate, regular rhythm and normal heart sounds.  Pulmonary/Chest: Effort normal and breath sounds normal. No stridor.  Abdominal: Bowel sounds are normal.  Musculoskeletal:        General: No edema.  Lymphadenopathy:    She has no cervical adenopathy.  Neurological: She is alert and oriented to person, place, and time.  Skin: Skin is warm and dry. She is not diaphoretic.  Psychiatric: She has a normal mood and affect. Her behavior is normal.    BP (!) 164/82   Pulse 80   Temp 97.7 F (36.5 C) (Tympanic)   Ht 5\' 1"  (1.549 m)   Wt 205 lb 6.4 oz (93.2 kg)   SpO2 99%   BMI 38.81 kg/m  Wt Readings from Last 3 Encounters:  05/20/19 205 lb 6.4 oz (93.2 kg)  12/13/17 206 lb (93.4 kg)  06/07/17 213 lb (96.6 kg)   Rechecked BP 160/80  Health Maintenance Due  Topic Date Due  . Hepatitis C Screening  Never done  . COVID-19 Vaccine (1) Never done  . TETANUS/TDAP  Never done  . DEXA SCAN  Never done  . MAMMOGRAM  12/08/2014    There are no preventive care  reminders to display for this patient.  No results found for: TSH Lab Results  Component Value Date   WBC 7.8 05/20/2019   HGB 14.4 05/20/2019   HCT 42.1 05/20/2019   MCV 87.1 05/20/2019   PLT 264.0 05/20/2019   Lab Results  Component Value Date   NA 136 05/20/2019   K 3.9 05/20/2019   CO2 25 05/20/2019   GLUCOSE 105 (H) 05/20/2019   BUN 13 05/20/2019   CREATININE 0.69 05/20/2019   BILITOT 1.4 (H) 05/20/2019   ALKPHOS 68 05/20/2019   AST 16 05/20/2019   ALT 12 05/20/2019   PROT 7.7 05/20/2019   ALBUMIN 4.5 05/20/2019   CALCIUM 9.1 05/20/2019   ANIONGAP 8 05/22/2017   GFR 83.94  05/20/2019   Lab Results  Component Value Date   CHOL 178 05/20/2019   Lab Results  Component Value Date   HDL 56.90 05/20/2019   Lab Results  Component Value Date   LDLCALC 91 05/20/2019   Lab Results  Component Value Date   TRIG 147.0 05/20/2019   Lab Results  Component Value Date   CHOLHDL 3 05/20/2019   Lab Results  Component Value Date   HGBA1C 5.7 05/20/2019      Assessment & Plan:   Problem List Items Addressed This Visit      Cardiovascular and Mediastinum   Essential hypertension - Primary (Chronic)   Relevant Medications   amLODipine (NORVASC) 10 MG tablet   spironolactone (ALDACTONE) 25 MG tablet   lisinopril (ZESTRIL) 20 MG tablet   chlorthalidone (HYGROTON) 25 MG tablet   Other Relevant Orders   CBC (Completed)   Comprehensive metabolic panel (Completed)   Urinalysis, Routine w reflex microscopic   Microalbumin / creatinine urine ratio     Other   Mixed hyperlipidemia   Relevant Medications   amLODipine (NORVASC) 10 MG tablet   spironolactone (ALDACTONE) 25 MG tablet   lisinopril (ZESTRIL) 20 MG tablet   chlorthalidone (HYGROTON) 25 MG tablet   Other Relevant Orders   LDL cholesterol, direct (Completed)   Lipid panel (Completed)   Urinalysis, Routine w reflex microscopic   Microalbumin / creatinine urine ratio   Elevated glucose   Relevant Orders    Hemoglobin A1c (Completed)   Urinalysis, Routine w reflex microscopic   Microalbumin / creatinine urine ratio   Vitamin D deficiency   Relevant Orders   VITAMIN D 25 Hydroxy (Vit-D Deficiency, Fractures) (Completed)   Urinalysis, Routine w reflex microscopic   Microalbumin / creatinine urine ratio   Non-compliant patient   Relevant Orders   Urinalysis, Routine w reflex microscopic   Microalbumin / creatinine urine ratio      Meds ordered this encounter  Medications  . amLODipine (NORVASC) 10 MG tablet    Sig: Take 1 tablet (10 mg total) by mouth daily.    Dispense:  30 tablet    Refill:  0  . spironolactone (ALDACTONE) 25 MG tablet    Sig: Take 1 tablet (25 mg total) by mouth daily.    Dispense:  30 tablet    Refill:  0  . lisinopril (ZESTRIL) 20 MG tablet    Sig: Take 1 tablet (20 mg total) by mouth daily.    Dispense:  30 tablet    Refill:  0  . chlorthalidone (HYGROTON) 25 MG tablet    Sig: Take 1 tablet (25 mg total) by mouth daily.    Dispense:  30 tablet    Refill:  0    Follow-up: Return in about 1 month (around 06/19/2019).   For the foreseeable future patient will be given limited amounts of medicine with close follow-up. Mliss Sax, MD   4/28 addendum: did not tolerate lisinopril.

## 2019-05-23 NOTE — Telephone Encounter (Signed)
Duplicate message. 

## 2019-05-26 ENCOUNTER — Telehealth: Payer: Self-pay | Admitting: Family Medicine

## 2019-05-26 NOTE — Telephone Encounter (Signed)
Patient is calling and requesting a call back regarding her bloodwork. CB is [phone]

## 2019-05-27 ENCOUNTER — Telehealth: Payer: Self-pay

## 2019-05-27 DIAGNOSIS — I10 Essential (primary) hypertension: Secondary | ICD-10-CM

## 2019-05-27 NOTE — Telephone Encounter (Signed)
Patient calling asking if there was something else she could take instead of Lisinopril 20mg . Per patient he is having to many side effects and would not like to continue on this medication. Please advise.

## 2019-05-28 MED ORDER — CHLORTHALIDONE 25 MG PO TABS: 25.0000 mg | ORAL_TABLET | Freq: Every day | ORAL | 0 refills | Status: DC

## 2019-05-28 NOTE — Addendum Note (Signed)
Addended by: Andrez Grime on: 05/28/2019 08:01 AM   Modules accepted: Orders

## 2019-05-28 NOTE — Telephone Encounter (Signed)
Patient aware that Rx sent to pharmacy  

## 2019-05-28 NOTE — Telephone Encounter (Signed)
Spoke with patient who's calling for a copy of labs which was printed off and mailed to patient.

## 2019-06-02 ENCOUNTER — Other Ambulatory Visit: Payer: Self-pay | Admitting: Family Medicine

## 2019-06-02 DIAGNOSIS — E782 Mixed hyperlipidemia: Secondary | ICD-10-CM

## 2019-06-03 ENCOUNTER — Other Ambulatory Visit: Payer: Self-pay | Admitting: Family Medicine

## 2019-06-03 DIAGNOSIS — E782 Mixed hyperlipidemia: Secondary | ICD-10-CM

## 2019-06-03 DIAGNOSIS — I10 Essential (primary) hypertension: Secondary | ICD-10-CM

## 2019-06-15 ENCOUNTER — Other Ambulatory Visit: Payer: Self-pay | Admitting: Family Medicine

## 2019-06-15 DIAGNOSIS — I10 Essential (primary) hypertension: Secondary | ICD-10-CM

## 2019-06-18 ENCOUNTER — Telehealth: Payer: Self-pay | Admitting: Family Medicine

## 2019-06-18 ENCOUNTER — Other Ambulatory Visit: Payer: Self-pay

## 2019-06-18 NOTE — Telephone Encounter (Signed)
Patient calling states that pharmacy called her informing her that lisinopril was ready for pick up. Per patient that medication was discontinued and a different medication was sent in, I seen in records that this is fact contacted [facility] to inform them that patient no longer taking this medication.

## 2019-06-18 NOTE — Telephone Encounter (Signed)
Pt called and requested call back asap from [facility] regarding a mixup with her medication, please advise , Pt number [phone]

## 2019-06-19 ENCOUNTER — Ambulatory Visit: Payer: Medicare Other | Admitting: Family Medicine

## 2019-06-23 ENCOUNTER — Other Ambulatory Visit: Payer: Self-pay | Admitting: Family Medicine

## 2019-06-23 DIAGNOSIS — I10 Essential (primary) hypertension: Secondary | ICD-10-CM

## 2019-06-25 ENCOUNTER — Ambulatory Visit: Payer: Medicare Other | Admitting: Family Medicine

## 2019-07-01 ENCOUNTER — Other Ambulatory Visit: Payer: Self-pay

## 2019-07-02 ENCOUNTER — Telehealth: Payer: Self-pay | Admitting: Family Medicine

## 2019-07-02 ENCOUNTER — Ambulatory Visit (INDEPENDENT_AMBULATORY_CARE_PROVIDER_SITE_OTHER): Payer: Medicare Other | Admitting: Family Medicine

## 2019-07-02 ENCOUNTER — Encounter: Payer: Self-pay | Admitting: Family Medicine

## 2019-07-02 VITALS — BP 158/70 | HR 80 | Temp 98.7°F | Ht 61.0 in | Wt 199.8 lb

## 2019-07-02 DIAGNOSIS — Z818 Family history of other mental and behavioral disorders: Secondary | ICD-10-CM

## 2019-07-02 DIAGNOSIS — I10 Essential (primary) hypertension: Secondary | ICD-10-CM | POA: Diagnosis not present

## 2019-07-02 DIAGNOSIS — L309 Dermatitis, unspecified: Secondary | ICD-10-CM | POA: Insufficient documentation

## 2019-07-02 MED ORDER — AMLODIPINE BESYLATE 10 MG PO TABS: 10.0000 mg | ORAL_TABLET | Freq: Every day | ORAL | 0 refills | Status: DC

## 2019-07-02 MED ORDER — CHLORTHALIDONE 25 MG PO TABS: 25.0000 mg | ORAL_TABLET | Freq: Every day | ORAL | 0 refills | Status: DC

## 2019-07-02 MED ORDER — SPIRONOLACTONE 25 MG PO TABS: 25.0000 mg | ORAL_TABLET | Freq: Every day | ORAL | 0 refills | Status: DC

## 2019-07-02 MED ORDER — TRIAMCINOLONE ACETONIDE 0.1 % EX OINT: TOPICAL_OINTMENT | CUTANEOUS | 0 refills | Status: DC

## 2019-07-02 MED ORDER — METOPROLOL SUCCINATE ER 50 MG PO TB24: 50.0000 mg | ORAL_TABLET | Freq: Every day | ORAL | 1 refills | Status: DC

## 2019-07-02 NOTE — Progress Notes (Signed)
Established Patient Office Visit  Subjective:  Patient ID: Donna [name], female    DOB: [birth year]  Age: 69 y.o. MRN: [id number]  CC:  Chief Complaint  Patient presents with  . Follow-up    1 month follow up on BP and medications.     HPI Donna [name] presents for follow-up of her blood pressure.  Assures compliance with the Norvasc, spironolactone and chlorthalidone.  No issues taking these medications.  Continues to feel family stress.  Has been diagnosed with bladder cancer and continues not to smoke.  She continues to raise her 71 year old grandson.  His mother her daughter is in recovery and taking naloxone.  She has fissuring and cracking in her fingertip pads of 2 of her fingers.  Spends a lot of time washing dishes at home.  Past Medical History:  Diagnosis Date  . Essential hypertension 05/22/2017    Past Surgical History:  Procedure Laterality Date  . CHOLECYSTECTOMY N/A 05/18/2017   Procedure: LAPAROSCOPIC CHOLECYSTECTOMY WITH INTRAOPERATIVE CHOLANGIOGRAM;  Surgeon: Griselda Miner, MD;  Location: [facility];  Service: General;  Laterality: N/A;  . ENDOSCOPIC RETROGRADE CHOLANGIOPANCREATOGRAPHY (ERCP) WITH PROPOFOL N/A 05/21/2017   Procedure: ENDOSCOPIC RETROGRADE CHOLANGIOPANCREATOGRAPHY (ERCP) WITH PROPOFOL;  Surgeon: Vida Rigger, MD;  Location: [facility];  Service: Endoscopy;  Laterality: N/A;    No family history on file.  Social History   Socioeconomic History  . Marital status: Married    Spouse name: Not on file  . Number of children: Not on file  . Years of education: Not on file  . Highest education level: Not on file  Occupational History  . Not on file  Tobacco Use  . Smoking status: Never Smoker  . Smokeless tobacco: Never Used  Substance and Sexual Activity  . Alcohol use: Never  . Drug use: Never  . Sexual activity: Not on file  Other Topics Concern  . Not on file  Social History Narrative  . Not on file   Social Determinants of Health    Financial Resource Strain:   . Difficulty of Paying Living Expenses:   Food Insecurity:   . Worried About Programme researcher, broadcasting/film/video in the Last Year:   . Barista in the Last Year:   Transportation Needs:   . Freight forwarder (Medical):   Marland Kitchen Lack of Transportation (Non-Medical):   Physical Activity:   . Days of Exercise per Week:   . Minutes of Exercise per Session:   Stress:   . Feeling of Stress :   Social Connections:   . Frequency of Communication with Friends and Family:   . Frequency of Social Gatherings with Friends and Family:   . Attends Religious Services:   . Active Member of Clubs or Organizations:   . Attends Banker Meetings:   Marland Kitchen Marital Status:   Intimate Partner Violence:   . Fear of Current or Ex-Partner:   . Emotionally Abused:   Marland Kitchen Physically Abused:   . Sexually Abused:     Outpatient Medications Prior to Visit  Medication Sig Dispense Refill  . aspirin EC 81 MG tablet Take 81 mg by mouth every other day.    . rosuvastatin (CRESTOR) 10 MG tablet TAKE ONE TABLET BY MOUTH DAILY 30 tablet 0  . amLODipine (NORVASC) 10 MG tablet Take 1 tablet (10 mg total) by mouth daily. 30 tablet 0  . chlorthalidone (HYGROTON) 25 MG tablet TAKE ONE TABLET BY MOUTH DAILY 30 tablet 0  . spironolactone (  ALDACTONE) 25 MG tablet TAKE ONE TABLET BY MOUTH DAILY 30 tablet 0  . lisinopril (ZESTRIL) 20 MG tablet TAKE ONE TABLET BY MOUTH DAILY (Patient not taking: Reported on 07/02/2019) 30 tablet 0  . Vitamin D, Ergocalciferol, (DRISDOL) 1.25 MG (50000 UNIT) CAPS capsule TAKE 1 CAPSULE BY MOUTH ONCE A WEEK. (Patient not taking: Reported on 07/02/2019) 4 capsule 2   No facility-administered medications prior to visit.    Allergies  Allergen Reactions  . Penicillins     Unsure of the reaction- happened around age 84    ROS Review of Systems  Constitutional: Negative.   Respiratory: Negative.   Cardiovascular: Negative.   Gastrointestinal: Negative.    Neurological: Negative for speech difficulty and light-headedness.  Psychiatric/Behavioral: The patient is nervous/anxious.       Objective:    Physical Exam  Constitutional: She is oriented to person, place, and time. She appears well-developed and well-nourished. No distress.  HENT:  Head: Normocephalic and atraumatic.  Right Ear: External ear normal.  Left Ear: External ear normal.  Eyes: Conjunctivae are normal. Right eye exhibits no discharge. Left eye exhibits no discharge. No scleral icterus.  Neck: No JVD present. No tracheal deviation present. No thyromegaly present.  Cardiovascular: Normal rate, regular rhythm and normal heart sounds.  Pulmonary/Chest: Effort normal and breath sounds normal. No stridor.  Musculoskeletal:        General: No edema.  Lymphadenopathy:    She has no cervical adenopathy.  Neurological: She is alert and oriented to person, place, and time.  Skin: Skin is warm and dry. She is not diaphoretic.     Psychiatric: She has a normal mood and affect. Her behavior is normal.    BP (!) 158/70   Pulse 80   Temp 98.7 F (37.1 C) (Tympanic)   Ht 5\' 1"  (1.549 m)   Wt 199 lb 12.8 oz (90.6 kg)   SpO2 97%   BMI 37.75 kg/m  Wt Readings from Last 3 Encounters:  07/02/19 199 lb 12.8 oz (90.6 kg)  05/20/19 205 lb 6.4 oz (93.2 kg)  12/13/17 206 lb (93.4 kg)     Health Maintenance Due  Topic Date Due  . Hepatitis C Screening  Never done  . COVID-19 Vaccine (1) Never done  . TETANUS/TDAP  Never done  . DEXA SCAN  Never done  . MAMMOGRAM  12/08/2014    There are no preventive care reminders to display for this patient.  No results found for: TSH Lab Results  Component Value Date   WBC 7.8 05/20/2019   HGB 14.4 05/20/2019   HCT 42.1 05/20/2019   MCV 87.1 05/20/2019   PLT 264.0 05/20/2019   Lab Results  Component Value Date   NA 136 05/20/2019   K 3.9 05/20/2019   CO2 25 05/20/2019   GLUCOSE 105 (H) 05/20/2019   BUN 13 05/20/2019    CREATININE 0.69 05/20/2019   BILITOT 1.4 (H) 05/20/2019   ALKPHOS 68 05/20/2019   AST 16 05/20/2019   ALT 12 05/20/2019   PROT 7.7 05/20/2019   ALBUMIN 4.5 05/20/2019   CALCIUM 9.1 05/20/2019   ANIONGAP 8 05/22/2017   GFR 83.94 05/20/2019   Lab Results  Component Value Date   CHOL 178 05/20/2019   Lab Results  Component Value Date   HDL 56.90 05/20/2019   Lab Results  Component Value Date   LDLCALC 91 05/20/2019   Lab Results  Component Value Date   TRIG 147.0 05/20/2019   Lab Results  Component Value Date   CHOLHDL 3 05/20/2019   Lab Results  Component Value Date   HGBA1C 5.7 05/20/2019      Assessment & Plan:   Problem List Items Addressed This Visit      Cardiovascular and Mediastinum   Essential hypertension - Primary (Chronic)   Relevant Medications   amLODipine (NORVASC) 10 MG tablet   chlorthalidone (HYGROTON) 25 MG tablet   spironolactone (ALDACTONE) 25 MG tablet   metoprolol succinate (TOPROL-XL) 50 MG 24 hr tablet     Musculoskeletal and Integument   Dermatitis   Relevant Medications   triamcinolone ointment (KENALOG) 0.1 %     Other   Family history of problems related to stress   Relevant Orders   Ambulatory referral to Psychology      Meds ordered this encounter  Medications  . amLODipine (NORVASC) 10 MG tablet    Sig: Take 1 tablet (10 mg total) by mouth daily.    Dispense:  30 tablet    Refill:  0  . chlorthalidone (HYGROTON) 25 MG tablet    Sig: Take 1 tablet (25 mg total) by mouth daily.    Dispense:  30 tablet    Refill:  0  . spironolactone (ALDACTONE) 25 MG tablet    Sig: Take 1 tablet (25 mg total) by mouth daily.    Dispense:  30 tablet    Refill:  0  . metoprolol succinate (TOPROL-XL) 50 MG 24 hr tablet    Sig: Take 1 tablet (50 mg total) by mouth daily. Take with or immediately following a meal.    Dispense:  30 tablet    Refill:  1  . triamcinolone ointment (KENALOG) 0.1 %    Sig: Apply a thin strip to fissures  on finger tips once daily as needed.    Dispense:  30 g    Refill:  0    Follow-up: Return in about 4 weeks (around 07/30/2019).   Agrees to go for talking therapy.  Have added metoprolol.  Norvasc 10 PM.  Aldactone and chlorthalidone in a.m.  Libby Maw, MD

## 2019-07-02 NOTE — Telephone Encounter (Signed)
Patient called back and stated that she was supposed to get a prescription for cortisone cream called into the pharmacy for her hand. [phone]

## 2019-07-02 NOTE — Patient Instructions (Addendum)
   Managing Your Hypertension Hypertension is commonly called high blood pressure. This is when the force of your blood pressing against the walls of your arteries is too strong. Arteries are blood vessels that carry blood from your heart throughout your body. Hypertension forces the heart to work harder to pump blood, and may cause the arteries to become narrow or stiff. Having untreated or uncontrolled hypertension can cause heart attack, stroke, kidney disease, and other problems. What are blood pressure readings? A blood pressure reading consists of a higher number over a lower number. Ideally, your blood pressure should be below 120/80. The first ("top") number is called the systolic pressure. It is a measure of the pressure in your arteries as your heart beats. The second ("bottom") number is called the diastolic pressure. It is a measure of the pressure in your arteries as the heart relaxes. What does my blood pressure reading mean? Blood pressure is classified into four stages. Based on your blood pressure reading, your health care provider may use the following stages to determine what type of treatment you need, if any. Systolic pressure and diastolic pressure are measured in a unit called mm Hg. Normal  Systolic pressure: below 120.  Diastolic pressure: below 80. Elevated  Systolic pressure: 120-129.  Diastolic pressure: below 80. Hypertension stage 1  Systolic pressure: 130-139.  Diastolic pressure: 80-89. Hypertension stage 2  Systolic pressure: 140 or above.  Diastolic pressure: 90 or above. What health risks are associated with hypertension? Managing your hypertension is an important responsibility. Uncontrolled hypertension can lead to:  A heart attack.  A stroke.  A weakened blood vessel (aneurysm).  Heart failure.  Kidney damage.  Eye damage.  Metabolic syndrome.  Memory and concentration problems. What changes can I make to manage my  hypertension? Hypertension can be managed by making lifestyle changes and possibly by taking medicines. Your health care provider will help you make a plan to bring your blood pressure within a normal range. Eating and drinking   Eat a diet that is high in fiber and potassium, and low in salt (sodium), added sugar, and fat. An example eating plan is called the DASH (Dietary Approaches to Stop Hypertension) diet. To eat this way: ? Eat plenty of fresh fruits and vegetables. Try to fill half of your plate at each meal with fruits and vegetables. ? Eat whole grains, such as whole wheat pasta, brown rice, or whole grain bread. Fill about one quarter of your plate with whole grains. ? Eat low-fat diary products. ? Avoid fatty cuts of meat, processed or cured meats, and poultry with skin. Fill about one quarter of your plate with lean proteins such as fish, chicken without skin, beans, eggs, and tofu. ? Avoid premade and processed foods. These tend to be higher in sodium, added sugar, and fat.  Reduce your daily sodium intake. Most people with hypertension should eat less than 1,500 mg of sodium a day.  Limit alcohol intake to no more than 1 drink a day for nonpregnant women and 2 drinks a day for men. One drink equals 12 oz of beer, 5 oz of wine, or 1 oz of hard liquor. Lifestyle  Work with your health care provider to maintain a healthy body weight, or to lose weight. Ask what an ideal weight is for you.  Get at least 30 minutes of exercise that causes your heart to beat faster (aerobic exercise) most days of the week. Activities may include walking, swimming, or biking.    Include exercise to strengthen your muscles (resistance exercise), such as weight lifting, as part of your weekly exercise routine. Try to do these types of exercises for 30 minutes at least 3 days a week.  Do not use any products that contain nicotine or tobacco, such as cigarettes and e-cigarettes. If you need help quitting,  ask your health care provider.  Control any long-term (chronic) conditions you have, such as high cholesterol or diabetes. Monitoring  Monitor your blood pressure at home as told by your health care provider. Your personal target blood pressure may vary depending on your medical conditions, your age, and other factors.  Have your blood pressure checked regularly, as often as told by your health care provider. Working with your health care provider  Review all the medicines you take with your health care provider because there may be side effects or interactions.  Talk with your health care provider about your diet, exercise habits, and other lifestyle factors that may be contributing to hypertension.  Visit your health care provider regularly. Your health care provider can help you create and adjust your plan for managing hypertension. Will I need medicine to control my blood pressure? Your health care provider may prescribe medicine if lifestyle changes are not enough to get your blood pressure under control, and if:  Your systolic blood pressure is 130 or higher.  Your diastolic blood pressure is 80 or higher. Take medicines only as told by your health care provider. Follow the directions carefully. Blood pressure medicines must be taken as prescribed. The medicine does not work as well when you skip doses. Skipping doses also puts you at risk for problems. Contact a health care provider if:  You think you are having a reaction to medicines you have taken.  You have repeated (recurrent) headaches.  You feel dizzy.  You have swelling in your ankles.  You have trouble with your vision. Get help right away if:  You develop a severe headache or confusion.  You have unusual weakness or numbness, or you feel faint.  You have severe pain in your chest or abdomen.  You vomit repeatedly.  You have trouble breathing. Summary  Hypertension is when the force of blood pumping  through your arteries is too strong. If this condition is not controlled, it may put you at risk for serious complications.  Your personal target blood pressure may vary depending on your medical conditions, your age, and other factors. For most people, a normal blood pressure is less than 120/80.  Hypertension is managed by lifestyle changes, medicines, or both. Lifestyle changes include weight loss, eating a healthy, low-sodium diet, exercising more, and limiting alcohol. This information is not intended to replace advice given to you by your health care provider. Make sure you discuss any questions you have with your health care provider. Document Revised: 05/10/2018 Document Reviewed: 12/15/2015 Elsevier Patient Education  2020 [employer] Inc[employer]  

## 2019-07-03 NOTE — Telephone Encounter (Signed)
Patient called back and wanted to know what time of day she should take her blood pressure medication. CB is [phone]

## 2019-07-03 NOTE — Telephone Encounter (Signed)
Spoke with patient who verbally understood she should take medication as directed on bottle while or immediately after meal.

## 2019-07-03 NOTE — Telephone Encounter (Signed)
Rx sent to [facility]. See chart.

## 2019-07-04 ENCOUNTER — Ambulatory Visit: Payer: Medicare Other | Admitting: Family Medicine

## 2019-07-20 ENCOUNTER — Other Ambulatory Visit: Payer: Self-pay | Admitting: Family Medicine

## 2019-07-20 DIAGNOSIS — I10 Essential (primary) hypertension: Secondary | ICD-10-CM

## 2019-07-28 ENCOUNTER — Other Ambulatory Visit: Payer: Self-pay | Admitting: Family Medicine

## 2019-07-28 DIAGNOSIS — I10 Essential (primary) hypertension: Secondary | ICD-10-CM

## 2019-07-30 ENCOUNTER — Other Ambulatory Visit: Payer: Self-pay

## 2019-07-31 ENCOUNTER — Encounter: Payer: Self-pay | Admitting: Family Medicine

## 2019-07-31 ENCOUNTER — Ambulatory Visit (INDEPENDENT_AMBULATORY_CARE_PROVIDER_SITE_OTHER): Payer: Medicare Other | Admitting: Family Medicine

## 2019-07-31 VITALS — BP 128/66 | HR 60 | Temp 97.3°F | Ht 61.0 in | Wt 200.4 lb

## 2019-07-31 DIAGNOSIS — E782 Mixed hyperlipidemia: Secondary | ICD-10-CM

## 2019-07-31 DIAGNOSIS — L720 Epidermal cyst: Secondary | ICD-10-CM

## 2019-07-31 DIAGNOSIS — I10 Essential (primary) hypertension: Secondary | ICD-10-CM | POA: Diagnosis not present

## 2019-07-31 MED ORDER — SPIRONOLACTONE 25 MG PO TABS: 25.0000 mg | ORAL_TABLET | Freq: Every day | ORAL | 0 refills | Status: DC

## 2019-07-31 MED ORDER — ROSUVASTATIN CALCIUM 10 MG PO TABS: 10.0000 mg | ORAL_TABLET | Freq: Every day | ORAL | 2 refills | Status: DC

## 2019-07-31 MED ORDER — METOPROLOL SUCCINATE ER 50 MG PO TB24: 50.0000 mg | ORAL_TABLET | Freq: Every day | ORAL | 1 refills | Status: DC

## 2019-07-31 MED ORDER — CHLORTHALIDONE 25 MG PO TABS: 25.0000 mg | ORAL_TABLET | Freq: Every day | ORAL | 1 refills | Status: DC

## 2019-07-31 NOTE — Progress Notes (Signed)
Established Patient Office Visit  Subjective:  Patient ID: Donna [name], female    DOB: [birth year]  Age: 69 y.o. MRN: [id number]  CC:  Chief Complaint  Patient presents with  . Follow-up    1 month follow up on BP, no concerns.     HPI Cheila Wickstrom presents for follow-up of her hypertension.  Blood pressure controlled with combination of amlodipine, chlorthalidone, metoprolol and spironolactone.  No issues taking these medications.  Checking BP at home.  Highest value she is seen at home is 140/80.  Elected not to go for counseling.  She is involved in a small group in her church for mutual support.  Concerned about husband's bladder cancer and the fact that he continues to smoke.  Past Medical History:  Diagnosis Date  . Essential hypertension 05/22/2017    Past Surgical History:  Procedure Laterality Date  . CHOLECYSTECTOMY N/A 05/18/2017   Procedure: LAPAROSCOPIC CHOLECYSTECTOMY WITH INTRAOPERATIVE CHOLANGIOGRAM;  Surgeon: Griselda Miner, MD;  Location: [facility];  Service: General;  Laterality: N/A;  . ENDOSCOPIC RETROGRADE CHOLANGIOPANCREATOGRAPHY (ERCP) WITH PROPOFOL N/A 05/21/2017   Procedure: ENDOSCOPIC RETROGRADE CHOLANGIOPANCREATOGRAPHY (ERCP) WITH PROPOFOL;  Surgeon: Vida Rigger, MD;  Location: [facility];  Service: Endoscopy;  Laterality: N/A;    History reviewed. No pertinent family history.  Social History   Socioeconomic History  . Marital status: Married    Spouse name: Not on file  . Number of children: Not on file  . Years of education: Not on file  . Highest education level: Not on file  Occupational History  . Not on file  Tobacco Use  . Smoking status: Never Smoker  . Smokeless tobacco: Never Used  Substance and Sexual Activity  . Alcohol use: Never  . Drug use: Never  . Sexual activity: Not on file  Other Topics Concern  . Not on file  Social History Narrative  . Not on file   Social Determinants of Health   Financial Resource Strain:   .  Difficulty of Paying Living Expenses:   Food Insecurity:   . Worried About Programme researcher, broadcasting/film/video in the Last Year:   . Barista in the Last Year:   Transportation Needs:   . Freight forwarder (Medical):   Marland Kitchen Lack of Transportation (Non-Medical):   Physical Activity:   . Days of Exercise per Week:   . Minutes of Exercise per Session:   Stress:   . Feeling of Stress :   Social Connections:   . Frequency of Communication with Friends and Family:   . Frequency of Social Gatherings with Friends and Family:   . Attends Religious Services:   . Active Member of Clubs or Organizations:   . Attends Banker Meetings:   Marland Kitchen Marital Status:   Intimate Partner Violence:   . Fear of Current or Ex-Partner:   . Emotionally Abused:   Marland Kitchen Physically Abused:   . Sexually Abused:     Outpatient Medications Prior to Visit  Medication Sig Dispense Refill  . amLODipine (NORVASC) 10 MG tablet TAKE ONE TABLET BY MOUTH DAILY 30 tablet 0  . aspirin EC 81 MG tablet Take 81 mg by mouth every other day.    . triamcinolone ointment (KENALOG) 0.1 % Apply a thin strip to fissures on finger tips once daily as needed. 30 g 0  . chlorthalidone (HYGROTON) 25 MG tablet TAKE ONE TABLET BY MOUTH DAILY 30 tablet 0  . metoprolol succinate (TOPROL-XL) 50 MG 24  hr tablet Take 1 tablet (50 mg total) by mouth daily. Take with or immediately following a meal. 30 tablet 1  . rosuvastatin (CRESTOR) 10 MG tablet TAKE ONE TABLET BY MOUTH DAILY 30 tablet 0  . spironolactone (ALDACTONE) 25 MG tablet Take 1 tablet (25 mg total) by mouth daily. 30 tablet 0   No facility-administered medications prior to visit.    Allergies  Allergen Reactions  . Penicillins     Unsure of the reaction- happened around age 64    ROS Review of Systems  Eyes: Negative for photophobia and visual disturbance.  Respiratory: Negative.   Cardiovascular: Negative.   Gastrointestinal: Negative.   Neurological: Negative for  headaches.      Objective:    Physical Exam Constitutional:      General: She is not in acute distress.    Appearance: Normal appearance. She is obese. She is not ill-appearing, toxic-appearing or diaphoretic.  HENT:     Head: Normocephalic and atraumatic.     Mouth/Throat:     Mouth: Mucous membranes are moist.     Pharynx: Oropharynx is clear. No oropharyngeal exudate or posterior oropharyngeal erythema.  Eyes:     General: No scleral icterus.       Right eye: No discharge.        Left eye: No discharge.     Extraocular Movements: Extraocular movements intact.     Conjunctiva/sclera: Conjunctivae normal.     Pupils: Pupils are equal, round, and reactive to light.  Cardiovascular:     Rate and Rhythm: Normal rate and regular rhythm.  Pulmonary:     Effort: Pulmonary effort is normal.     Breath sounds: Normal breath sounds.  Musculoskeletal:     Cervical back: No rigidity or tenderness.  Lymphadenopathy:     Cervical: No cervical adenopathy.  Skin:      Neurological:     Mental Status: She is alert and oriented to person, place, and time.  Psychiatric:        Mood and Affect: Mood normal.        Behavior: Behavior normal.     BP 128/66   Pulse 60   Temp (!) 97.3 F (36.3 C) (Tympanic)   Ht 5\' 1"  (1.549 m)   Wt 200 lb 6.4 oz (90.9 kg)   SpO2 96%   BMI 37.87 kg/m  Wt Readings from Last 3 Encounters:  07/31/19 200 lb 6.4 oz (90.9 kg)  07/02/19 199 lb 12.8 oz (90.6 kg)  05/20/19 205 lb 6.4 oz (93.2 kg)     Health Maintenance Due  Topic Date Due  . Hepatitis C Screening  Never done  . COVID-19 Vaccine (1) Never done  . TETANUS/TDAP  Never done  . DEXA SCAN  Never done  . MAMMOGRAM  12/08/2014    There are no preventive care reminders to display for this patient.  No results found for: TSH Lab Results  Component Value Date   WBC 7.8 05/20/2019   HGB 14.4 05/20/2019   HCT 42.1 05/20/2019   MCV 87.1 05/20/2019   PLT 264.0 05/20/2019   Lab Results   Component Value Date   NA 136 05/20/2019   K 3.9 05/20/2019   CO2 25 05/20/2019   GLUCOSE 105 (H) 05/20/2019   BUN 13 05/20/2019   CREATININE 0.69 05/20/2019   BILITOT 1.4 (H) 05/20/2019   ALKPHOS 68 05/20/2019   AST 16 05/20/2019   ALT 12 05/20/2019   PROT 7.7 05/20/2019  ALBUMIN 4.5 05/20/2019   CALCIUM 9.1 05/20/2019   ANIONGAP 8 05/22/2017   GFR 83.94 05/20/2019   Lab Results  Component Value Date   CHOL 178 05/20/2019   Lab Results  Component Value Date   HDL 56.90 05/20/2019   Lab Results  Component Value Date   LDLCALC 91 05/20/2019   Lab Results  Component Value Date   TRIG 147.0 05/20/2019   Lab Results  Component Value Date   CHOLHDL 3 05/20/2019   Lab Results  Component Value Date   HGBA1C 5.7 05/20/2019      Assessment & Plan:   Problem List Items Addressed This Visit      Cardiovascular and Mediastinum   Essential hypertension - Primary (Chronic)   Relevant Medications   chlorthalidone (HYGROTON) 25 MG tablet   metoprolol succinate (TOPROL-XL) 50 MG 24 hr tablet   rosuvastatin (CRESTOR) 10 MG tablet   spironolactone (ALDACTONE) 25 MG tablet     Other   Mixed hyperlipidemia   Relevant Medications   chlorthalidone (HYGROTON) 25 MG tablet   metoprolol succinate (TOPROL-XL) 50 MG 24 hr tablet   rosuvastatin (CRESTOR) 10 MG tablet   spironolactone (ALDACTONE) 25 MG tablet    Other Visit Diagnoses    Epidermal inclusion cyst          Meds ordered this encounter  Medications  . chlorthalidone (HYGROTON) 25 MG tablet    Sig: Take 1 tablet (25 mg total) by mouth daily.    Dispense:  90 tablet    Refill:  1  . metoprolol succinate (TOPROL-XL) 50 MG 24 hr tablet    Sig: Take 1 tablet (50 mg total) by mouth daily. Take with or immediately following a meal.    Dispense:  90 tablet    Refill:  1  . rosuvastatin (CRESTOR) 10 MG tablet    Sig: Take 1 tablet (10 mg total) by mouth daily.    Dispense:  90 tablet    Refill:  2  .  spironolactone (ALDACTONE) 25 MG tablet    Sig: Take 1 tablet (25 mg total) by mouth daily.    Dispense:  30 tablet    Refill:  0    Follow-up: Return in about 3 months (around 10/31/2019), or if symptoms worsen or fail to improve.    Mliss Sax, MD

## 2019-08-12 ENCOUNTER — Other Ambulatory Visit: Payer: Self-pay | Admitting: Family Medicine

## 2019-08-12 DIAGNOSIS — I10 Essential (primary) hypertension: Secondary | ICD-10-CM

## 2019-08-13 ENCOUNTER — Telehealth: Payer: Self-pay | Admitting: Family Medicine

## 2019-08-13 DIAGNOSIS — I10 Essential (primary) hypertension: Secondary | ICD-10-CM

## 2019-08-13 NOTE — Telephone Encounter (Signed)
Patient is calling and stated that she received her spironolactone but for only 30 days and wanted to see why it wasn't for the full 90 days, please advise. CB is [phone]

## 2019-08-13 NOTE — Telephone Encounter (Signed)
Patient calling with concerns about Spironolactone per pt she only has 30 day supply of this medications and 90 for all of her other medications. She's not sure if you want her to stop taking after 30 days and see how her BP is when she come in October with out being on this for 2 months or what please advise.

## 2019-08-14 MED ORDER — SPIRONOLACTONE 25 MG PO TABS: 25.0000 mg | ORAL_TABLET | Freq: Every day | ORAL | 0 refills | Status: DC

## 2019-08-22 ENCOUNTER — Other Ambulatory Visit: Payer: Self-pay | Admitting: Family Medicine

## 2019-08-22 DIAGNOSIS — I10 Essential (primary) hypertension: Secondary | ICD-10-CM

## 2019-09-15 ENCOUNTER — Telehealth: Payer: Self-pay | Admitting: Family Medicine

## 2019-09-15 NOTE — Telephone Encounter (Signed)
Patient's husband left message with after hours service 09/13/19 2:46 p.m.: Caller states wife is in [place] on vacation and has caught the Covid virus. Started having symptoms Wednesday morning. She had fever for three days but now just having cough. The doctor at [facility] recommended she get the monoclonal antibody infusion in the nest 2-3 days.

## 2019-09-15 NOTE — Telephone Encounter (Signed)
Where is she? If still in [place], should have it done there.

## 2019-09-15 NOTE — Telephone Encounter (Signed)
Dr. Kremer please advise message below.  

## 2019-09-16 NOTE — Telephone Encounter (Signed)
Patient called back states that all of her symptoms have subsided and she will take OTC medications for anything that may reappear. No other concerns at this time.

## 2019-09-16 NOTE — Telephone Encounter (Signed)
Returned call no answer LMTCB 

## 2019-09-21 ENCOUNTER — Encounter (HOSPITAL_COMMUNITY): Payer: Self-pay | Admitting: Obstetrics and Gynecology

## 2019-09-21 ENCOUNTER — Inpatient Hospital Stay (HOSPITAL_COMMUNITY)
Admission: EM | Admit: 2019-09-21 | Discharge: 2019-09-25 | DRG: 177 | Disposition: A | Discharge status: Home / Self Care | Payer: Medicare Other | Attending: Internal Medicine | Admitting: Internal Medicine

## 2019-09-21 ENCOUNTER — Emergency Department (HOSPITAL_COMMUNITY): Payer: Medicare Other

## 2019-09-21 ENCOUNTER — Other Ambulatory Visit: Payer: Self-pay

## 2019-09-21 DIAGNOSIS — J1282 Pneumonia due to coronavirus disease 2019: Secondary | ICD-10-CM | POA: Diagnosis not present

## 2019-09-21 DIAGNOSIS — Z7982 Long term (current) use of aspirin: Secondary | ICD-10-CM

## 2019-09-21 DIAGNOSIS — J189 Pneumonia, unspecified organism: Secondary | ICD-10-CM | POA: Diagnosis not present

## 2019-09-21 DIAGNOSIS — I10 Essential (primary) hypertension: Secondary | ICD-10-CM | POA: Diagnosis present

## 2019-09-21 DIAGNOSIS — E871 Hypo-osmolality and hyponatremia: Secondary | ICD-10-CM | POA: Diagnosis not present

## 2019-09-21 DIAGNOSIS — R197 Diarrhea, unspecified: Secondary | ICD-10-CM | POA: Diagnosis present

## 2019-09-21 DIAGNOSIS — J9601 Acute respiratory failure with hypoxia: Secondary | ICD-10-CM | POA: Diagnosis not present

## 2019-09-21 DIAGNOSIS — Z79899 Other long term (current) drug therapy: Secondary | ICD-10-CM

## 2019-09-21 DIAGNOSIS — E669 Obesity, unspecified: Secondary | ICD-10-CM | POA: Diagnosis present

## 2019-09-21 DIAGNOSIS — U071 COVID-19: Principal | ICD-10-CM | POA: Diagnosis present

## 2019-09-21 DIAGNOSIS — Z743 Need for continuous supervision: Secondary | ICD-10-CM | POA: Diagnosis not present

## 2019-09-21 DIAGNOSIS — R404 Transient alteration of awareness: Secondary | ICD-10-CM | POA: Diagnosis not present

## 2019-09-21 DIAGNOSIS — E785 Hyperlipidemia, unspecified: Secondary | ICD-10-CM | POA: Diagnosis not present

## 2019-09-21 DIAGNOSIS — Z6834 Body mass index (BMI) 34.0-34.9, adult: Secondary | ICD-10-CM

## 2019-09-21 DIAGNOSIS — E876 Hypokalemia: Secondary | ICD-10-CM | POA: Diagnosis not present

## 2019-09-21 DIAGNOSIS — R0689 Other abnormalities of breathing: Secondary | ICD-10-CM | POA: Diagnosis not present

## 2019-09-21 LAB — COMPREHENSIVE METABOLIC PANEL
ALT: 28 U/L (ref 0–44)
AST: 34 U/L (ref 15–41)
Albumin: 3.3 g/dL — ABNORMAL LOW (ref 3.5–5.0)
Alkaline Phosphatase: 66 U/L (ref 38–126)
Anion gap: 15 (ref 5–15)
BUN: 28 mg/dL — ABNORMAL HIGH (ref 8–23)
CO2: 31 mmol/L (ref 22–32)
Calcium: 8 mg/dL — ABNORMAL LOW (ref 8.9–10.3)
Chloride: 88 mmol/L — ABNORMAL LOW (ref 98–111)
Creatinine, Ser: 0.97 mg/dL (ref 0.44–1.00)
GFR calc Af Amer: >60 mL/min (ref >60)
GFR calc non Af Amer: 59 mL/min — ABNORMAL LOW (ref >60)
Glucose, Bld: 131 mg/dL — ABNORMAL HIGH (ref 70–99)
Potassium: 2.7 mmol/L — CL (ref 3.5–5.1)
Sodium: 134 mmol/L — ABNORMAL LOW (ref 135–145)
Total Bilirubin: 1.5 mg/dL — ABNORMAL HIGH (ref 0.3–1.2)
Total Protein: 7.6 g/dL (ref 6.5–8.1)

## 2019-09-21 LAB — CBC WITH DIFFERENTIAL/PLATELET
Abs Immature Granulocytes: 0.1 10*3/uL — ABNORMAL HIGH (ref 0.00–0.07)
Basophils Absolute: 0 10*3/uL (ref 0.0–0.1)
Basophils Relative: 0 %
Eosinophils Absolute: 0 10*3/uL (ref 0.0–0.5)
Eosinophils Relative: 0 %
HCT: 44.6 % (ref 36.0–46.0)
Hemoglobin: 14.7 g/dL (ref 12.0–15.0)
Immature Granulocytes: 1 %
Lymphocytes Relative: 6 %
Lymphs Abs: 0.6 10*3/uL — ABNORMAL LOW (ref 0.7–4.0)
MCH: 28.8 pg (ref 26.0–34.0)
MCHC: 33 g/dL (ref 30.0–36.0)
MCV: 87.5 fL (ref 80.0–100.0)
Monocytes Absolute: 0.5 10*3/uL (ref 0.1–1.0)
Monocytes Relative: 5 %
Neutro Abs: 8.9 10*3/uL — ABNORMAL HIGH (ref 1.7–7.7)
Neutrophils Relative %: 88 %
Platelets: 324 10*3/uL (ref 150–400)
RBC: 5.1 MIL/uL (ref 3.87–5.11)
RDW: 12.9 % (ref 11.5–15.5)
WBC: 10.1 10*3/uL (ref 4.0–10.5)
nRBC: 0 % (ref 0.0–0.2)

## 2019-09-21 LAB — BLOOD GAS, ARTERIAL
Acid-Base Excess: 10.2 mmol/L — ABNORMAL HIGH (ref 0.0–2.0)
Bicarbonate: 33.4 mmol/L — ABNORMAL HIGH (ref 20.0–28.0)
Drawn by: 308601
O2 Content: 8 L/min
O2 Saturation: 90.7 %
Patient temperature: 98.6
pCO2 arterial: 38.6 mmHg (ref 32.0–48.0)
pH, Arterial: 7.546 — ABNORMAL HIGH (ref 7.350–7.450)
pO2, Arterial: 57 mmHg — ABNORMAL LOW (ref 83.0–108.0)

## 2019-09-21 LAB — FERRITIN: Ferritin: 561 ng/mL — ABNORMAL HIGH (ref 11–307)

## 2019-09-21 LAB — PROTIME-INR
INR: 1.1 (ref 0.8–1.2)
Prothrombin Time: 13.9 seconds (ref 11.4–15.2)

## 2019-09-21 LAB — FIBRINOGEN: Fibrinogen: 696 mg/dL — ABNORMAL HIGH (ref 210–475)

## 2019-09-21 LAB — C-REACTIVE PROTEIN: CRP: 9.1 mg/dL — ABNORMAL HIGH (ref <1.0)

## 2019-09-21 LAB — D-DIMER, QUANTITATIVE: D-Dimer, Quant: 2.24 ug/mL-FEU — ABNORMAL HIGH (ref 0.00–0.50)

## 2019-09-21 LAB — SARS CORONAVIRUS 2 BY RT PCR (HOSPITAL ORDER, PERFORMED IN ~~LOC~~ HOSPITAL LAB): SARS Coronavirus 2: POSITIVE — AB

## 2019-09-21 LAB — LACTIC ACID, PLASMA: Lactic Acid, Venous: 1.7 mmol/L (ref 0.5–1.9)

## 2019-09-21 LAB — PROCALCITONIN: Procalcitonin: <0.1 ng/mL

## 2019-09-21 LAB — TRIGLYCERIDES: Triglycerides: 144 mg/dL (ref <150)

## 2019-09-21 LAB — LACTATE DEHYDROGENASE: LDH: 281 U/L — ABNORMAL HIGH (ref 98–192)

## 2019-09-21 MED ORDER — TOCILIZUMAB 400 MG/20ML IV SOLN: 8.0000 mg/kg | Freq: Once | INTRAVENOUS | Status: DC

## 2019-09-21 MED ORDER — POTASSIUM CHLORIDE 10 MEQ/100ML IV SOLN
10.0000 meq | INTRAVENOUS | Status: AC
  Administered 2019-09-21 – 2019-09-22 (×4): 10 meq via INTRAVENOUS
  Filled 2019-09-21 (×4): qty 100

## 2019-09-21 MED ORDER — SODIUM CHLORIDE 0.9 % IV BOLUS
500.0000 mL | Freq: Once | INTRAVENOUS | Status: AC
  Administered 2019-09-21: 500 mL via INTRAVENOUS

## 2019-09-21 MED ORDER — SODIUM CHLORIDE 0.9 % IV SOLN
100.0000 mg | Freq: Every day | INTRAVENOUS | Status: AC
  Administered 2019-09-22 – 2019-09-25 (×4): 100 mg via INTRAVENOUS
  Filled 2019-09-21 (×4): qty 20

## 2019-09-21 MED ORDER — SODIUM CHLORIDE 0.9 % IV BOLUS: 500.0000 mL | Freq: Once | INTRAVENOUS | Status: DC

## 2019-09-21 MED ORDER — ACETAMINOPHEN 325 MG PO TABS
650.0000 mg | ORAL_TABLET | Freq: Four times a day (QID) | ORAL | Status: DC | PRN
  Filled 2019-09-21: qty 2

## 2019-09-21 MED ORDER — SODIUM CHLORIDE 0.9 % IV SOLN
200.0000 mg | Freq: Once | INTRAVENOUS | Status: AC
  Administered 2019-09-21: 200 mg via INTRAVENOUS
  Filled 2019-09-21: qty 200

## 2019-09-21 MED ORDER — GUAIFENESIN-DM 100-10 MG/5ML PO SYRP: 10.0000 mL | ORAL_SOLUTION | ORAL | Status: DC | PRN

## 2019-09-21 MED ORDER — HYDROCOD POLST-CPM POLST ER 10-8 MG/5ML PO SUER: 5.0000 mL | Freq: Two times a day (BID) | ORAL | Status: DC | PRN

## 2019-09-21 MED ORDER — ENOXAPARIN SODIUM 40 MG/0.4ML ~~LOC~~ SOLN
40.0000 mg | Freq: Every day | SUBCUTANEOUS | Status: DC
  Administered 2019-09-21 – 2019-09-24 (×4): 40 mg via SUBCUTANEOUS
  Filled 2019-09-21 (×4): qty 0.4

## 2019-09-21 MED ORDER — BARICITINIB 2 MG PO TABS
2.0000 mg | ORAL_TABLET | Freq: Every day | ORAL | Status: DC
  Administered 2019-09-22 (×2): 2 mg via ORAL
  Filled 2019-09-21 (×2): qty 1

## 2019-09-21 MED ORDER — ACETAMINOPHEN 325 MG PO TABS
650.0000 mg | ORAL_TABLET | Freq: Once | ORAL | Status: AC | PRN
  Administered 2019-09-21: 650 mg via ORAL
  Filled 2019-09-21: qty 2

## 2019-09-21 MED ORDER — DEXAMETHASONE 4 MG PO TABS
6.0000 mg | ORAL_TABLET | Freq: Every day | ORAL | Status: DC
  Administered 2019-09-21: 6 mg via ORAL
  Filled 2019-09-21: qty 1

## 2019-09-21 MED ORDER — ONDANSETRON HCL 4 MG PO TABS: 4.0000 mg | ORAL_TABLET | Freq: Four times a day (QID) | ORAL | Status: DC | PRN

## 2019-09-21 MED ORDER — DEXAMETHASONE SODIUM PHOSPHATE 10 MG/ML IJ SOLN
6.0000 mg | Freq: Once | INTRAMUSCULAR | Status: DC
  Filled 2019-09-21: qty 1

## 2019-09-21 MED ORDER — ONDANSETRON HCL 4 MG/2ML IJ SOLN: 4.0000 mg | Freq: Four times a day (QID) | INTRAMUSCULAR | Status: DC | PRN

## 2019-09-21 NOTE — ED Triage Notes (Signed)
Patient reports to the ER for COVID + x1 week. Patient reportedly has been having episodes of incontinence and her sats were 70's on RA. Patient noted to be 78% on RA here. Pt placed on [place] and came up to 89% on 6L. Patient placed on HFNC at 8L and is currently at 94%.

## 2019-09-21 NOTE — ED Notes (Signed)
Date and time results received: 09/21/19 2339 (use smartphrase ".now" to insert current time)  Test:covid Critical Value: positive  Name of Provider Notified:wickline  Orders Received? Or Actions Taken?:No new orders

## 2019-09-21 NOTE — ED Notes (Signed)
Date and time results received: 09/21/19 2204 (use smartphrase ".now" to insert current time)  Test: Potassium  Critical Value: 2.7  Name of Provider Notified: Dr. Lynelle Doctor  Orders Received? Or Actions Taken?: Awaiting orders

## 2019-09-21 NOTE — ED Provider Notes (Signed)
[facility]-EMERGENCY DEPT Provider Note   CSN: [id number] Arrival date & time: 09/21/19  2024     History Chief Complaint  Patient presents with  . COVID Positive  . Shortness of Breath    Donna Raringlia Schmale is a 69 y.o. female.  HPI   Patient presents to the ED for evaluation of confusion, shortness of breath and incontinence with known COVID-19 diagnosis.  According to the EMS report as well as the medical records the patient was diagnosed with Covid about a week ago.  [place] at that time.  The urgent care recommended she get monoclonal antibody treatment.  Patient ended up contacting her primary care doctor but informed him that her symptoms had subsided and she was planning on taking over-the-counter medications.  Family called EMS today because the patient was becoming more confused and listless.  When EMS arrived her oxygen saturation was in the 70s.  Patient also was incontinent of urine and stool when she was at home.  Patient in the ED still appears somewhat confused.  She is somewhat somnolent.  She does admit that she is feeling poorly.  Past Medical History:  Diagnosis Date  . Essential hypertension 05/22/2017    Patient Active Problem List   Diagnosis Date Noted  . Family history of problems related to stress 07/02/2019  . Dermatitis 07/02/2019  . Non-compliant patient 05/20/2019  . Elevated glucose 12/13/2017  . Vitamin D deficiency 12/13/2017  . Class 2 obesity due to excess calories with body mass index (BMI) of 38.0 to 38.9 in adult 12/13/2017  . Recent major surgery 06/07/2017  . Essential hypertension 05/22/2017  . Mixed hyperlipidemia 05/22/2017  . Gallstones 05/18/2017    Past Surgical History:  Procedure Laterality Date  . CHOLECYSTECTOMY N/A 05/18/2017   Procedure: LAPAROSCOPIC CHOLECYSTECTOMY WITH INTRAOPERATIVE CHOLANGIOGRAM;  Surgeon: Griselda Mineroth, Paul III, MD;  Location: [facility] ORS;  Service: General;  Laterality: N/A;  . ENDOSCOPIC  RETROGRADE CHOLANGIOPANCREATOGRAPHY (ERCP) WITH PROPOFOL N/A 05/21/2017   Procedure: ENDOSCOPIC RETROGRADE CHOLANGIOPANCREATOGRAPHY (ERCP) WITH PROPOFOL;  Surgeon: Vida RiggerMagod, Marc, MD;  Location: [facility] ENDOSCOPY;  Service: Endoscopy;  Laterality: N/A;     OB History   No obstetric history on file.     No family history on file.  Social History   Tobacco Use  . Smoking status: Never Smoker  . Smokeless tobacco: Never Used  Substance Use Topics  . Alcohol use: Never  . Drug use: Never    Home Medications Prior to Admission medications   Medication Sig Start Date End Date Taking? Authorizing Provider  amLODipine (NORVASC) 10 MG tablet TAKE ONE TABLET BY MOUTH DAILY 08/22/19   Mliss SaxKremer, William Alfred, MD  aspirin EC 81 MG tablet Take 81 mg by mouth every other day. 08/25/16   [provider]  chlorthalidone (HYGROTON) 25 MG tablet Take 1 tablet (25 mg total) by mouth daily. 07/31/19   Mliss SaxKremer, William Alfred, MD  metoprolol succinate (TOPROL-XL) 50 MG 24 hr tablet Take 1 tablet (50 mg total) by mouth daily. Take with or immediately following a meal. 07/31/19   Mliss SaxKremer, William Alfred, MD  rosuvastatin (CRESTOR) 10 MG tablet Take 1 tablet (10 mg total) by mouth daily. 07/31/19   Mliss SaxKremer, William Alfred, MD  spironolactone (ALDACTONE) 25 MG tablet Take 1 tablet (25 mg total) by mouth daily. 08/14/19   Mliss SaxKremer, William Alfred, MD  triamcinolone ointment (KENALOG) 0.1 % Apply a thin strip to fissures on finger tips once daily as needed. 07/02/19  Mliss Sax, MD    Allergies    Penicillins  Review of Systems   Review of Systems  All other systems reviewed and are negative.   Physical Exam Updated Vital Signs BP 91/63   Pulse 66   Temp (!) 102.7 F (39.3 C) (Oral)   Resp (!) 26   Ht 1.575 m (5\' 2" )   Wt 85 kg   SpO2 (!) 89%   BMI 34.27 kg/m   Physical Exam Vitals and nursing note reviewed.  Constitutional:      Appearance: She is ill-appearing.     Comments: Obese    HENT:     Head: Normocephalic and atraumatic.     Right Ear: External ear normal.     Left Ear: External ear normal.  Eyes:     General: No scleral icterus.       Right eye: No discharge.        Left eye: No discharge.     Conjunctiva/sclera: Conjunctivae normal.  Neck:     Trachea: No tracheal deviation.  Cardiovascular:     Rate and Rhythm: Normal rate and regular rhythm.  Pulmonary:     Effort: Pulmonary effort is normal. No respiratory distress.     Breath sounds: No stridor. Rales present. No wheezing.  Abdominal:     General: Bowel sounds are normal. There is no distension.     Palpations: Abdomen is soft.     Tenderness: There is no abdominal tenderness. There is no guarding or rebound.  Musculoskeletal:        General: No tenderness.     Cervical back: Neck supple.  Skin:    General: Skin is warm and dry.     Findings: No rash.  Neurological:     Mental Status: She is alert.     GCS: GCS eye subscore is 4. GCS verbal subscore is 4. GCS motor subscore is 6.     Cranial Nerves: No cranial nerve deficit (no facial droop, extraocular movements intact, no slurred speech).     Sensory: No sensory deficit.     Motor: Weakness present. No abnormal muscle tone or seizure activity.     Coordination: Coordination normal.     Comments: Listless     ED Results / Procedures / Treatments   Labs (all labs ordered are listed, but only abnormal results are displayed) Labs Reviewed  COMPREHENSIVE METABOLIC PANEL - Abnormal; Notable for the following components:      Result Value   Sodium 134 (*)    Potassium 2.7 (*)    Chloride 88 (*)    Glucose, Bld 131 (*)    BUN 28 (*)    Calcium 8.0 (*)    Albumin 3.3 (*)    Total Bilirubin 1.5 (*)    GFR calc non Af Amer 59 (*)    All other components within normal limits  CBC WITH DIFFERENTIAL/PLATELET - Abnormal; Notable for the following components:   Neutro Abs 8.9 (*)    Lymphs Abs 0.6 (*)    Abs Immature Granulocytes 0.10 (*)     All other components within normal limits  LACTATE DEHYDROGENASE - Abnormal; Notable for the following components:   LDH 281 (*)    All other components within normal limits  FERRITIN - Abnormal; Notable for the following components:   Ferritin 561 (*)    All other components within normal limits  C-REACTIVE PROTEIN - Abnormal; Notable for the following components:   CRP 9.1 (*)  All other components within normal limits  BLOOD GAS, ARTERIAL - Abnormal; Notable for the following components:   pH, Arterial 7.546 (*)    pO2, Arterial 57.0 (*)    Bicarbonate 33.4 (*)    Acid-Base Excess 10.2 (*)    All other components within normal limits  CULTURE, BLOOD (ROUTINE X 2)  CULTURE, BLOOD (ROUTINE X 2)  SARS CORONAVIRUS 2 BY RT PCR (HOSPITAL ORDER, PERFORMED IN [facility])  LACTIC ACID, PLASMA  TRIGLYCERIDES  LACTIC ACID, PLASMA  PROTIME-INR  URINALYSIS, ROUTINE W REFLEX MICROSCOPIC  D-DIMER, QUANTITATIVE (NOT AT [facility])  PROCALCITONIN  FIBRINOGEN    EKG EKG Interpretation  Date/Time:  Sunday September 21 2019 21:02:27 EDT Ventricular Rate:  74 PR Interval:    QRS Duration: 91 QT Interval:  348 QTC Calculation: 386 R Axis:   51 Text Interpretation: Sinus rhythm Abnormal R-wave progression, early transition Borderline T abnormalities, inferior leads , new since last tracing Confirmed by Linwood Dibbles [phone]) on 09/21/2019 9:17:02 PM   Radiology DG Chest Port 1 View  Result Date: 09/21/2019 CLINICAL DATA:  COVID pneumonia EXAM: PORTABLE CHEST 1 VIEW COMPARISON:  None. FINDINGS: Lung volumes are small. Patchy infiltrate is seen within the right mid and lower lung zone, likely infectious or inflammatory in the acute setting. No pneumothorax or pleural effusion. Cardiac size within normal limits. Pulmonary vascularity is normal. IMPRESSION: Patchy infiltrate within the right mid and lower lung zone, likely infectious or inflammatory in the acute setting. Electronically  Signed   By: Helyn Numbers MD   On: 09/21/2019 21:57    Procedures .Critical Care Performed by: Linwood Dibbles, MD Authorized by: Linwood Dibbles, MD   Critical care provider statement:    Critical care time (minutes):  45   Critical care was time spent personally by me on the following activities:  Discussions with consultants, evaluation of patient's response to treatment, examination of patient, ordering and performing treatments and interventions, ordering and review of laboratory studies, ordering and review of radiographic studies, pulse oximetry, re-evaluation of patient's condition, obtaining history from patient or surrogate and review of old charts   (including critical care time)  Medications Ordered in ED Medications  potassium chloride 10 mEq in 100 mL IVPB (has no administration in time range)  dexamethasone (DECADRON) injection 6 mg (has no administration in time range)  sodium chloride 0.9 % bolus 500 mL (has no administration in time range)  acetaminophen (TYLENOL) tablet 650 mg (650 mg Oral Given 09/21/19 2104)    ED Course  I have reviewed the triage vital signs and the nursing notes.  Pertinent labs & imaging results that were available during my care of the patient were reviewed by me and considered in my medical decision making (see chart for details).  Clinical Course as of Sep 21 2230  Wynelle Link Sep 21, 2019  2210 Labs reviewed.  Inflammatory markers elevated with known Covid diagnosis.  ABG shows hypoxia with a metabolic alkalosis   [JK]  2219 CXR consistent with pna   [JK]    Clinical Course User Index [JK] Linwood Dibbles, MD   MDM Rules/Calculators/A&P                          Patient presented to the ED with worsening infection associated with COVID-19.  Patient was diagnosed in [place] about a week ago.  Apparently her symptoms have been progressing.  Patient is hypoxic requiring high flow nasal cannula oxygen.  She is remaining in the low 90s.  Patient's laboratory  tests are also notable for hypokalemia.  IV potassium has been ordered.  I have ordered Decadron and remdesivir.  Patient will be admitted to the hospital for further treatment.  We will need to monitor closely for worsening respiratory distress Final Clinical Impression(s) / ED Diagnoses Final diagnoses:  Pneumonia due to COVID-19 virus     Linwood Dibbles, MD 09/21/19 2232

## 2019-09-21 NOTE — ED Notes (Signed)
Perineal, genital and buttock are extremely reddened and painful.

## 2019-09-21 NOTE — H&P (Signed)
History and Physical    Donna [name] UXL:[id number] DOB: [birth year] DOA: 09/21/2019  PCP: Mliss Sax, MD  Patient coming from: Home  I have personally briefly reviewed patient's old medical records in [facility]  Chief Complaint: SOB, COVID  HPI: Donna [name] is a 69 y.o. female with medical history significant of HTN.  Pt diagnosed with COVID on 8/14 while in [place].  [facility] recd she get MAB but she felt a bit better so didn't end up going to get MAB according to her PCP office note on 8/16.  Pt presents to ED today for SOB, incontinence.  Family called EMS today due to worsening symptoms today with increased confusion.  EMS arrived O2 sat in the 70s.  Now satting 90% on 8L in ED.   ED Course: Now satting 90% on 8L in ED.  WBC 10k, CXR shows multifocal PNA, D.Dimer 2.2, CRP 9.1.  Lactate 1.7.  BP 91-116 systolic.  Mental status improved on oxygen.  Tm 102.7.  K 2.7.  COVID test pending.    Review of Systems: As per HPI, otherwise all review of systems negative.  Past Medical History:  Diagnosis Date  . Essential hypertension 05/22/2017    Past Surgical History:  Procedure Laterality Date  . CHOLECYSTECTOMY N/A 05/18/2017   Procedure: LAPAROSCOPIC CHOLECYSTECTOMY WITH INTRAOPERATIVE CHOLANGIOGRAM;  Surgeon: Griselda Miner, MD;  Location: [facility];  Service: General;  Laterality: N/A;  . ENDOSCOPIC RETROGRADE CHOLANGIOPANCREATOGRAPHY (ERCP) WITH PROPOFOL N/A 05/21/2017   Procedure: ENDOSCOPIC RETROGRADE CHOLANGIOPANCREATOGRAPHY (ERCP) WITH PROPOFOL;  Surgeon: Vida Rigger, MD;  Location: [facility];  Service: Endoscopy;  Laterality: N/A;     reports that she has never smoked. She has never used smokeless tobacco. She reports that she does not drink alcohol and does not use drugs.  Allergies  Allergen Reactions  . Penicillins     Unsure of the reaction- happened around age 50    No family history on file. Non-contributory.  Prior to Admission  medications   Medication Sig Start Date End Date Taking? Authorizing Provider  amLODipine (NORVASC) 10 MG tablet TAKE ONE TABLET BY MOUTH DAILY 08/22/19   Mliss Sax, MD  aspirin EC 81 MG tablet Take 81 mg by mouth every other day. 08/25/16   [provider]  chlorthalidone (HYGROTON) 25 MG tablet Take 1 tablet (25 mg total) by mouth daily. 07/31/19   Mliss Sax, MD  metoprolol succinate (TOPROL-XL) 50 MG 24 hr tablet Take 1 tablet (50 mg total) by mouth daily. Take with or immediately following a meal. 07/31/19   Mliss Sax, MD  rosuvastatin (CRESTOR) 10 MG tablet Take 1 tablet (10 mg total) by mouth daily. 07/31/19   Mliss Sax, MD  spironolactone (ALDACTONE) 25 MG tablet Take 1 tablet (25 mg total) by mouth daily. 08/14/19   Mliss Sax, MD  triamcinolone ointment (KENALOG) 0.1 % Apply a thin strip to fissures on finger tips once daily as needed. 07/02/19   Mliss Sax, MD    Physical Exam: Vitals:   09/21/19 2045 09/21/19 2046 09/21/19 2100 09/21/19 2200  BP: 113/76  116/75 91/63  Pulse: 77  77 66  Resp: (!) 22  (!) 23 (!) 26  Temp: (!) 102.7 F (39.3 C)     TempSrc: Oral     SpO2: 95%  92% (!) 89%  Weight:  85 kg    Height:  5\' 2"  (1.575 m)      Constitutional: NAD, calm, comfortable  Eyes: PERRL, lids and conjunctivae normal ENMT: Mucous membranes are moist. Posterior pharynx clear of any exudate or lesions.Normal dentition.  Neck: normal, supple, no masses, no thyromegaly Respiratory: clear to auscultation bilaterally, no wheezing, no crackles. Normal respiratory effort. No accessory muscle use.  Cardiovascular: Regular rate and rhythm, no murmurs / rubs / gallops. No extremity edema. 2+ pedal pulses. No carotid bruits.  Abdomen: no tenderness, no masses palpated. No hepatosplenomegaly. Bowel sounds positive.  Musculoskeletal: no clubbing / cyanosis. No joint deformity upper and lower extremities. Good ROM, no  contractures. Normal muscle tone.  Skin: no rashes, lesions, ulcers. No induration Neurologic: CN 2-12 grossly intact. Sensation intact, DTR normal. Strength 5/5 in all 4.  Psychiatric: Normal judgment and insight. Alert and oriented x 3. Normal mood.    Labs on Admission: I have personally reviewed following labs and imaging studies  CBC: Recent Labs  Lab 09/21/19 2055  WBC 10.1  NEUTROABS 8.9*  HGB 14.7  HCT 44.6  MCV 87.5  PLT 324   Basic Metabolic Panel: Recent Labs  Lab 09/21/19 2055  NA 134*  K 2.7*  CL 88*  CO2 31  GLUCOSE 131*  BUN 28*  CREATININE 0.97  CALCIUM 8.0*   GFR: Estimated Creatinine Clearance: 53.8 mL/min (by C-G formula based on SCr of 0.97 mg/dL). Liver Function Tests: Recent Labs  Lab 09/21/19 2055  AST 34  ALT 28  ALKPHOS 66  BILITOT 1.5*  PROT 7.6  ALBUMIN 3.3*   No results for input(s): LIPASE, AMYLASE in the last 168 hours. No results for input(s): AMMONIA in the last 168 hours. Coagulation Profile: Recent Labs  Lab 09/21/19 2055  INR 1.1   Cardiac Enzymes: No results for input(s): CKTOTAL, CKMB, CKMBINDEX, TROPONINI in the last 168 hours. BNP (last 3 results) No results for input(s): PROBNP in the last 8760 hours. HbA1C: No results for input(s): HGBA1C in the last 72 hours. CBG: No results for input(s): GLUCAP in the last 168 hours. Lipid Profile: Recent Labs    09/21/19 2057  TRIG 144   Thyroid Function Tests: No results for input(s): TSH, T4TOTAL, FREET4, T3FREE, THYROIDAB in the last 72 hours. Anemia Panel: Recent Labs    09/21/19 2057  FERRITIN 561*   Urine analysis: No results found for: COLORURINE, APPEARANCEUR, LABSPEC, PHURINE, GLUCOSEU, HGBUR, BILIRUBINUR, KETONESUR, PROTEINUR, UROBILINOGEN, NITRITE, LEUKOCYTESUR  Radiological Exams on Admission: DG Chest Port 1 View  Result Date: 09/21/2019 CLINICAL DATA:  COVID pneumonia EXAM: PORTABLE CHEST 1 VIEW COMPARISON:  None. FINDINGS: Lung volumes are  small. Patchy infiltrate is seen within the right mid and lower lung zone, likely infectious or inflammatory in the acute setting. No pneumothorax or pleural effusion. Cardiac size within normal limits. Pulmonary vascularity is normal. IMPRESSION: Patchy infiltrate within the right mid and lower lung zone, likely infectious or inflammatory in the acute setting. Electronically Signed   By: Helyn Numbers MD   On: 09/21/2019 21:57    EKG: Independently reviewed.  Assessment/Plan Principal Problem:   Acute hypoxemic respiratory failure due to COVID-19 [facility]) Active Problems:   Essential hypertension   Hypokalemia    1. Acute hypoxic resp failure due to COVID-19 1. Needing 8L to maintain sats 2. COVID test pending, but positive test last week per pt (both now and per PCP note from last week), and sure looks like COVID clinically. 3. COVID pathway 4. remdesivir 5. Decadron 6. barcitinib ordered - told RN to give when COVID-19 test comes back positive 7. Airborne precautions 8.  Tele monitor 9. Cont pulse ox 2. HTN - 1. Holding home BP meds due to soft BPs in ED 3. Hypokalemia - replace  DVT prophylaxis: Lovenox Code Status: Full Family Communication: No family in room Disposition Plan: Home after O2 requirement improved Consults called: None Admission status: Admit to inpatient  Severity of Illness: The appropriate patient status for this patient is INPATIENT. Inpatient status is judged to be reasonable and necessary in order to provide the required intensity of service to ensure the patient's safety. The patient's presenting symptoms, physical exam findings, and initial radiographic and laboratory data in the context of their chronic comorbidities is felt to place them at high risk for further clinical deterioration. Furthermore, it is not anticipated that the patient will be medically stable for discharge from the hospital within 2 midnights of admission. The following factors support  the patient status of inpatient.   IP status due to new O2 requirement associated with HYIFO-27   * I certify that at the point of admission it is my clinical judgment that the patient will require inpatient hospital care spanning beyond 2 midnights from the point of admission due to high intensity of service, high risk for further deterioration and high frequency of surveillance required.*    Leisha Trinkle M. DO [facility]  How to contact the [facility] Attending or Consulting provider 7A - 7P or covering provider during after hours 7P -7A, for this patient?  1. Check the care team in [facility] and look for a) attending/consulting [facility] provider listed and b) the [facility] team listed 2. Log into [email or web address]  Amion Physician Scheduling and messaging for groups and whole hospitals  On call and physician scheduling software for group practices, residents, hospitalists and other medical providers for call, clinic, rotation and shift schedules. OnCall [employer] is a hospital-wide system for scheduling doctors and paging doctors on call. EasyPlot is for scientific plotting and data analysis.  [email or web address]  and use [facility]'s universal password to access. If you do not have the password, please contact the hospital operator.  3. Locate the [facility] provider you are looking for under [facility] and page to a number that you can be directly reached. 4. If you still have difficulty reaching the provider, please page the [facility] (Director on Call) for the Hospitalists listed on amion for assistance.  09/21/2019, 11:13 PM

## 2019-09-22 ENCOUNTER — Encounter (HOSPITAL_COMMUNITY): Payer: Self-pay | Admitting: Internal Medicine

## 2019-09-22 LAB — CBC WITH DIFFERENTIAL/PLATELET
Abs Immature Granulocytes: 0.06 10*3/uL (ref 0.00–0.07)
Basophils Absolute: 0 10*3/uL (ref 0.0–0.1)
Basophils Relative: 0 %
Eosinophils Absolute: 0 10*3/uL (ref 0.0–0.5)
Eosinophils Relative: 0 %
HCT: 36.8 % (ref 36.0–46.0)
Hemoglobin: 12.3 g/dL (ref 12.0–15.0)
Immature Granulocytes: 1 %
Lymphocytes Relative: 7 %
Lymphs Abs: 0.5 10*3/uL — ABNORMAL LOW (ref 0.7–4.0)
MCH: 29.8 pg (ref 26.0–34.0)
MCHC: 33.4 g/dL (ref 30.0–36.0)
MCV: 89.1 fL (ref 80.0–100.0)
Monocytes Absolute: 0.1 10*3/uL (ref 0.1–1.0)
Monocytes Relative: 2 %
Neutro Abs: 6.1 10*3/uL (ref 1.7–7.7)
Neutrophils Relative %: 90 %
Platelets: 244 10*3/uL (ref 150–400)
RBC: 4.13 MIL/uL (ref 3.87–5.11)
RDW: 13.1 % (ref 11.5–15.5)
WBC: 6.7 10*3/uL (ref 4.0–10.5)
nRBC: 0 % (ref 0.0–0.2)

## 2019-09-22 LAB — URINALYSIS, ROUTINE W REFLEX MICROSCOPIC
Bilirubin Urine: NEGATIVE
Glucose, UA: NEGATIVE mg/dL
Ketones, ur: 5 mg/dL — AB
Nitrite: NEGATIVE
Protein, ur: NEGATIVE mg/dL
Specific Gravity, Urine: 1.019 (ref 1.005–1.030)
pH: 5 (ref 5.0–8.0)

## 2019-09-22 LAB — D-DIMER, QUANTITATIVE: D-Dimer, Quant: 1.68 ug/mL-FEU — ABNORMAL HIGH (ref 0.00–0.50)

## 2019-09-22 LAB — COMPREHENSIVE METABOLIC PANEL
ALT: 20 U/L (ref 0–44)
AST: 28 U/L (ref 15–41)
Albumin: 2.8 g/dL — ABNORMAL LOW (ref 3.5–5.0)
Alkaline Phosphatase: 54 U/L (ref 38–126)
Anion gap: 12 (ref 5–15)
BUN: 28 mg/dL — ABNORMAL HIGH (ref 8–23)
CO2: 30 mmol/L (ref 22–32)
Calcium: 7.2 mg/dL — ABNORMAL LOW (ref 8.9–10.3)
Chloride: 96 mmol/L — ABNORMAL LOW (ref 98–111)
Creatinine, Ser: 0.88 mg/dL (ref 0.44–1.00)
GFR calc Af Amer: >60 mL/min (ref >60)
GFR calc non Af Amer: >60 mL/min (ref >60)
Glucose, Bld: 134 mg/dL — ABNORMAL HIGH (ref 70–99)
Potassium: 3.5 mmol/L (ref 3.5–5.1)
Sodium: 138 mmol/L (ref 135–145)
Total Bilirubin: 1.2 mg/dL (ref 0.3–1.2)
Total Protein: 6.2 g/dL — ABNORMAL LOW (ref 6.5–8.1)

## 2019-09-22 LAB — C-REACTIVE PROTEIN: CRP: 7.8 mg/dL — ABNORMAL HIGH (ref <1.0)

## 2019-09-22 LAB — LACTIC ACID, PLASMA: Lactic Acid, Venous: 1.1 mmol/L (ref 0.5–1.9)

## 2019-09-22 LAB — ABO/RH: ABO/RH(D): O POS

## 2019-09-22 MED ORDER — ROSUVASTATIN CALCIUM 10 MG PO TABS
10.0000 mg | ORAL_TABLET | Freq: Every day | ORAL | Status: DC
  Administered 2019-09-22 – 2019-09-25 (×4): 10 mg via ORAL
  Filled 2019-09-22 (×5): qty 1

## 2019-09-22 MED ORDER — POTASSIUM CHLORIDE CRYS ER 20 MEQ PO TBCR
40.0000 meq | EXTENDED_RELEASE_TABLET | Freq: Once | ORAL | Status: AC
  Administered 2019-09-22: 40 meq via ORAL
  Filled 2019-09-22: qty 2

## 2019-09-22 MED ORDER — ZINC OXIDE 40 % EX OINT
TOPICAL_OINTMENT | Freq: Two times a day (BID) | CUTANEOUS | Status: DC
  Administered 2019-09-23: 1 via TOPICAL
  Filled 2019-09-22 (×2): qty 57

## 2019-09-22 MED ORDER — METHYLPREDNISOLONE SODIUM SUCC 40 MG IJ SOLR
40.0000 mg | Freq: Two times a day (BID) | INTRAMUSCULAR | Status: DC
  Administered 2019-09-22 – 2019-09-25 (×7): 40 mg via INTRAVENOUS
  Filled 2019-09-22 (×7): qty 1

## 2019-09-22 MED ORDER — FAMOTIDINE 20 MG PO TABS
20.0000 mg | ORAL_TABLET | Freq: Every day | ORAL | Status: DC
  Administered 2019-09-22 – 2019-09-24 (×3): 20 mg via ORAL
  Filled 2019-09-22 (×3): qty 1

## 2019-09-22 NOTE — ED Notes (Signed)
Assumed care at this time. Pt stable on 8L HFNC

## 2019-09-22 NOTE — ED Notes (Signed)
MD titrated O2 rate down to 7L, pt tolerated well. Will titrate further if she continues to remain stable. MD wants O2 sat maintained @ 87% or above.

## 2019-09-22 NOTE — ED Notes (Addendum)
O2 titrated down to 5L HFNC. Pt tolerated well and O2 sat is 98%

## 2019-09-22 NOTE — ED Notes (Signed)
Pt complains of pain in perianal area d/t skin breakdown exacerbated by the occurrence of loose stools

## 2019-09-22 NOTE — Progress Notes (Signed)
PROGRESS NOTE  Donna [name] ZDG:[id number] DOB: 06-May-1950 DOA: 09/21/2019  PCP: Mliss Sax, MD  Brief History/Interval Summary: 69 year old Caucasian female with a past medical history of essential hypertension who was diagnosed with COVID-19 on August 14 when she was in [place].  Monoclonal antibody was recommended but she ended up not getting it.  Presented to the emergency department with shortness of breath.  She has been having diarrhea at home.  She was found to have pneumonia on chest x-ray.  She was noted to be hypoxic.  She was hospitalized for further management.  Reason for Visit: Pneumonia due to COVID-19.  Acute respiratory failure with hypoxia.  Consultants: None  Procedures: None  Antibiotics: Anti-infectives (From admission, onward)   Start     Dose/Rate Route Frequency Ordered Stop   09/22/19 1000  remdesivir 100 mg in sodium chloride 0.9 % 100 mL IVPB       "Followed by" Linked Group Details   100 mg 200 mL/hr over 30 Minutes Intravenous Daily 09/21/19 2234 09/26/19 0959   09/21/19 2300  remdesivir 200 mg in sodium chloride 0.9% 250 mL IVPB       "Followed by" Linked Group Details   200 mg 580 mL/hr over 30 Minutes Intravenous Once 09/21/19 2234 09/22/19 0029      Subjective/Interval History: Patient mentions that she is feeling slightly better today compared to yesterday.  Still has shortness of breath even with minimal exertion.  Occasional dry cough.  Does complain of pain in her buttock area.  She mentions she had profuse diarrhea at home prior to arrival.  ROS: Denies any nausea or vomiting.    Assessment/Plan:  Acute Hypoxic Resp. Failure/Pneumonia due to COVID-19    Recent Labs  Lab 09/21/19 2055 09/21/19 2057 09/22/19 0500 09/22/19 0512  DDIMER 2.24*  --   --  1.68*  FERRITIN  --  561*  --   --   CRP  --  9.1* 7.8*  --   ALT 28  --   --  20  PROCALCITON <0.10  --   --   --     Objective findings: Fever: Temperature was  recorded as 102.7 yesterday evening.  Afebrile since then. Oxygen requirements: HFNC 8 L.  Titrated down to 7 L since his saturations were in the late 90s.  COVID 19 Therapeutics: Antibacterials: None.  Procalcitonin less than 0.1. Remdesivir: Day 2 Steroids: We will change over to Solu-Medrol Diuretics: None Actemra/Baricitinib: Baricitinib initiated on 8/22 PUD Prophylaxis: Initiate Pepcid DVT Prophylaxis:  Lovenox 40 mg daily  Patient seems to be stable from a respiratory standpoint.  She has actually feeling slightly better.  She saturating in the mid to late 90s on 8 L.  Lowered down to 7 L.  Continue to wean down as tolerated.  Continue with Remdesivir.  Change from dexamethasone to Solu-Medrol.    She was also started on baricitinib. Continue baricitinib, started 8/22, for 14 days or until hospital discharge. Monitor Cr, LFTs, differential.   Continue to trend inflammatory markers.  D-dimer noted to be elevated secondary to COVID-19.  Patient encouraged to stay in prone position as much as possible.  Incentive spirometer.  Mobilization.  The treatment plan and use of medications and known side effects were discussed with patient/family. Some of the medications used are based on case reports/anecdotal data.  All other medications being used in the management of COVID-19 based on limited study data.  Complete risks and long-term side effects are unknown, however  in the best clinical judgment they seem to be of some benefit.  Patient/family wanted to proceed with treatment options provided.  Acute diarrhea secondary to COVID-19 She has excoriation of her skin around the perianal area.  Local skin care.  Antimotility agents if she has recurrence of her diarrhea.  Essential hypertension Holding her antihypertensives due to borderline low blood pressures.  Hypokalemia Secondary to GI loss.  Repleted.  Potassium 3.5 this morning.  Will give additional dose.  Check magnesium  level.  Obesity Estimated body mass index is 34.27 kg/m as calculated from the following:   Height as of this encounter: 5\' 2"  (1.575 m).   Weight as of this encounter: 85 kg.  DVT Prophylaxis: Lovenox Code Status: Full code Family Communication: We will contact husband later today Disposition Plan: Hopefully return home when improved.  Mobilize.  Status is: Inpatient  Remains inpatient appropriate because:IV treatments appropriate due to intensity of illness or inability to take PO and Inpatient level of care appropriate due to severity of illness   Dispo: The patient is from: Home              Anticipated d/c is to: Home              Anticipated d/c date is: 3 days              Patient currently is not medically stable to d/c.    Medications:  Scheduled: . baricitinib  2 mg Oral QHS  . dexamethasone  6 mg Oral QHS  . enoxaparin (LOVENOX) injection  40 mg Subcutaneous QHS  . rosuvastatin  10 mg Oral Daily   Continuous: . remdesivir 100 mg in NS 100 mL     , chlorpheniramine-HYDROcodone, guaiFENesin-dextromethorphan, ondansetron **OR** ondansetron (ZOFRAN) IV   Objective:  Vital Signs  Vitals:   09/22/19 0836 09/22/19 0900 09/22/19 0930 09/22/19 1000  BP: 123/71 101/72 95/66 96/66   Pulse: (!) 56 (!) 51 (!) 50 (!) 54  Resp: 20 20 (!) 21 18  Temp:      TempSrc:      SpO2: 97% 98% 96% 96%  Weight:      Height:        Intake/Output Summary (Last 24 hours) at 09/22/2019 1034 Last data filed at 09/22/2019 0422 Gross per 24 hour  Intake 867.45 ml  Output --  Net 867.45 ml   Filed Weights   09/21/19 2046  Weight: 85 kg    General appearance: Awake alert.  In no distress Resp: Mildly tachypneic at rest.  No use of accessory muscles.  Coarse breath sounds bilaterally with crackles at the bases.  No wheezing or rhonchi. Cardio: S1-S2 is normal regular.  No S3-S4.  No rubs murmurs or bruit GI: Abdomen is soft.  Nontender nondistended.  Bowel  sounds are present normal.  No masses organomegaly Excoriations and erythematous rash noted in the perineal area. Extremities: No edema.  Full range of motion of lower extremities. Neurologic:  No focal neurological deficits.    Lab Results:  Data Reviewed: I have personally reviewed following labs and imaging studies  CBC: Recent Labs  Lab 09/21/19 2055 09/22/19 0512  WBC 10.1 6.7  NEUTROABS 8.9* 6.1  HGB 14.7 12.3  HCT 44.6 36.8  MCV 87.5 89.1  PLT 324 244    Basic Metabolic Panel: Recent Labs  Lab 09/21/19 2055 09/22/19 0512  NA 134* 138  K 2.7* 3.5  CL 88* 96*  CO2 31 30  GLUCOSE 131* 134*  BUN 28* 28*  CREATININE 0.97 0.88  CALCIUM 8.0* 7.2*    GFR: Estimated Creatinine Clearance: 59.3 mL/min (by C-G formula based on SCr of 0.88 mg/dL).  Liver Function Tests: Recent Labs  Lab 09/21/19 2055 09/22/19 0512  AST 34 28  ALT 28 20  ALKPHOS 66 54  BILITOT 1.5* 1.2  PROT 7.6 6.2*  ALBUMIN 3.3* 2.8*     Coagulation Profile: Recent Labs  Lab 09/21/19 2055  INR 1.1    Lipid Profile: Recent Labs    09/21/19 2057  TRIG 144     Anemia Panel: Recent Labs    09/21/19 2057  FERRITIN 561*    Recent Results (from the past 240 hour(s))  SARS Coronavirus 2 by RT PCR (hospital order, performed in [facility] hospital lab) Nasopharyngeal Nasopharyngeal Swab     Status: Abnormal   Collection Time: 09/21/19  9:07 PM   Specimen: Nasopharyngeal Swab  Result Value Ref Range Status   SARS Coronavirus 2 POSITIVE (A) NEGATIVE Final    Comment: RESULT CALLED TO, READ BACK BY AND VERIFIED WITH: J,NASH AT 2340 ON 09/21/19 BY A,MOHAMED (NOTE) SARS-CoV-2 target nucleic acids are DETECTED  SARS-CoV-2 RNA is generally detectable in upper respiratory specimens  during the acute phase of infection.  Positive results are indicative  of the presence of the identified virus, but do not rule out bacterial infection or co-infection with other pathogens not detected  by the test.  Clinical correlation with patient history and  other diagnostic information is necessary to determine patient infection status.  The expected result is negative.  Fact Sheet for Patients:   BoilerBrush.com.cy   Fact Sheet for Healthcare Providers:   https://pope.com/    This test is not yet approved or cleared by the [place] FDA and  has been authorized for detection and/or diagnosis of SARS-CoV-2 by FDA under an Emergency Use Authorization (EUA).  This EUA will remain in effect (meaning thi s test can be used) for the duration of  the COVID-19 declaration under Section 564(b)(1) of the Act, 21 U.S.C. section 360-bbb-3(b)(1), unless the authorization is terminated or revoked sooner.  Performed at [facility], [address], [place], [place] [address]       Radiology Studies: [facility] Chest Port 1 View  Result Date: 09/21/2019 CLINICAL DATA:  COVID pneumonia EXAM: PORTABLE CHEST 1 VIEW COMPARISON:  None. FINDINGS: Lung volumes are small. Patchy infiltrate is seen within the right mid and lower lung zone, likely infectious or inflammatory in the acute setting. No pneumothorax or pleural effusion. Cardiac size within normal limits. Pulmonary vascularity is normal. IMPRESSION: Patchy infiltrate within the right mid and lower lung zone, likely infectious or inflammatory in the acute setting. Electronically Signed   By: Helyn Numbers MD   On: 09/21/2019 21:57       LOS: 1 day   Osvaldo Shipper  [facility] Pager on [email or web address]  09/22/2019, 10:34 AM

## 2019-09-22 NOTE — ED Notes (Signed)
Perianal care performed. New Purewick placed

## 2019-09-22 NOTE — ED Notes (Signed)
Pt concerned with obtaining glasses that are at home because she cannot see a lot without them. MD said he will contact the family later today, but if the family calls then ask about brining the glasses

## 2019-09-22 NOTE — ED Notes (Signed)
O2 titrated down to 5L HFNC. Pt tolerated well. O2 sat @ 98%

## 2019-09-22 NOTE — Consult Note (Addendum)
WOC Nurse Consult Note: Reason for Consult: Consult requested for bilat buttocks.  Pt is in isolation for Covid and has red moist macerated areas; approx 10X10X.1cm, appearance is consistent with moisture associated skin damage.  Pt states she was frequently incontinent of urine prior to admission and was too sick to get out of bed.  She has been using Desitin.  Agree with this plan of care to repel moisture and promote healing.   Topical treatment orders provided for bedside nurses to perform as follows: Apply Desitin to buttocks BID and PRN when turning and cleaning. Please re-consult if further assistance is needed.  Thank-you,  Cammie Mcgee MSN, RN, CWOCN, [place], CNS [phone]

## 2019-09-23 LAB — CBC WITH DIFFERENTIAL/PLATELET
Abs Immature Granulocytes: 0.08 10*3/uL — ABNORMAL HIGH (ref 0.00–0.07)
Basophils Absolute: 0 10*3/uL (ref 0.0–0.1)
Basophils Relative: 0 %
Eosinophils Absolute: 0 10*3/uL (ref 0.0–0.5)
Eosinophils Relative: 0 %
HCT: 39.8 % (ref 36.0–46.0)
Hemoglobin: 13.1 g/dL (ref 12.0–15.0)
Immature Granulocytes: 1 %
Lymphocytes Relative: 13 %
Lymphs Abs: 0.9 10*3/uL (ref 0.7–4.0)
MCH: 29.5 pg (ref 26.0–34.0)
MCHC: 32.9 g/dL (ref 30.0–36.0)
MCV: 89.6 fL (ref 80.0–100.0)
Monocytes Absolute: 0.3 10*3/uL (ref 0.1–1.0)
Monocytes Relative: 5 %
Neutro Abs: 5.4 10*3/uL (ref 1.7–7.7)
Neutrophils Relative %: 81 %
Platelets: 309 10*3/uL (ref 150–400)
RBC: 4.44 MIL/uL (ref 3.87–5.11)
RDW: 12.9 % (ref 11.5–15.5)
WBC: 6.7 10*3/uL (ref 4.0–10.5)
nRBC: 0 % (ref 0.0–0.2)

## 2019-09-23 LAB — COMPREHENSIVE METABOLIC PANEL
ALT: 28 U/L (ref 0–44)
AST: 37 U/L (ref 15–41)
Albumin: 2.9 g/dL — ABNORMAL LOW (ref 3.5–5.0)
Alkaline Phosphatase: 56 U/L (ref 38–126)
Anion gap: 10 (ref 5–15)
BUN: 29 mg/dL — ABNORMAL HIGH (ref 8–23)
CO2: 29 mmol/L (ref 22–32)
Calcium: 7.7 mg/dL — ABNORMAL LOW (ref 8.9–10.3)
Chloride: 92 mmol/L — ABNORMAL LOW (ref 98–111)
Creatinine, Ser: 0.76 mg/dL (ref 0.44–1.00)
GFR calc Af Amer: >60 mL/min (ref >60)
GFR calc non Af Amer: >60 mL/min (ref >60)
Glucose, Bld: 167 mg/dL — ABNORMAL HIGH (ref 70–99)
Potassium: 3.4 mmol/L — ABNORMAL LOW (ref 3.5–5.1)
Sodium: 131 mmol/L — ABNORMAL LOW (ref 135–145)
Total Bilirubin: 1 mg/dL (ref 0.3–1.2)
Total Protein: 6.6 g/dL (ref 6.5–8.1)

## 2019-09-23 LAB — D-DIMER, QUANTITATIVE: D-Dimer, Quant: 1.4 ug/mL-FEU — ABNORMAL HIGH (ref 0.00–0.50)

## 2019-09-23 LAB — MAGNESIUM: Magnesium: 2.5 mg/dL — ABNORMAL HIGH (ref 1.7–2.4)

## 2019-09-23 LAB — C-REACTIVE PROTEIN: CRP: 5.2 mg/dL — ABNORMAL HIGH (ref <1.0)

## 2019-09-23 MED ORDER — POTASSIUM CHLORIDE CRYS ER 20 MEQ PO TBCR
40.0000 meq | EXTENDED_RELEASE_TABLET | Freq: Once | ORAL | Status: AC
  Administered 2019-09-23: 40 meq via ORAL
  Filled 2019-09-23: qty 2

## 2019-09-23 MED ORDER — POTASSIUM CHLORIDE IN NACL 20-0.9 MEQ/L-% IV SOLN
INTRAVENOUS | Status: AC
  Filled 2019-09-23: qty 1000

## 2019-09-23 MED ORDER — BARICITINIB 2 MG PO TABS
4.0000 mg | ORAL_TABLET | Freq: Every day | ORAL | Status: DC
  Administered 2019-09-23 – 2019-09-24 (×2): 4 mg via ORAL
  Filled 2019-09-23 (×2): qty 2

## 2019-09-23 NOTE — ED Notes (Signed)
Patient repositioned

## 2019-09-23 NOTE — Progress Notes (Addendum)
PROGRESS NOTE  Donna [name] FBP:[id number] DOB: 22-Jul-1950 DOA: 09/21/2019  PCP: Mliss Sax, MD  Brief History/Interval Summary: 69 year old Caucasian female with a past medical history of essential hypertension who was diagnosed with COVID-19 on August 14 when she was in [place].  Monoclonal antibody was recommended but she ended up not getting it.  Presented to the emergency department with shortness of breath.  She has been having diarrhea at home.  She was found to have pneumonia on chest x-ray.  She was noted to be hypoxic.  She was hospitalized for further management.  Reason for Visit: Pneumonia due to COVID-19.  Acute respiratory failure with hypoxia.  Consultants: None  Procedures: None  Antibiotics: Anti-infectives (From admission, onward)   Start     Dose/Rate Route Frequency Ordered Stop   09/22/19 1000  remdesivir 100 mg in sodium chloride 0.9 % 100 mL IVPB       "Followed by" Linked Group Details   100 mg 200 mL/hr over 30 Minutes Intravenous Daily 09/21/19 2234 09/26/19 0959   09/21/19 2300  remdesivir 200 mg in sodium chloride 0.9% 250 mL IVPB       "Followed by" Linked Group Details   200 mg 580 mL/hr over 30 Minutes Intravenous Once 09/21/19 2234 09/22/19 0029      Subjective/Interval History: Patient mentions that she is feeling better today compared to yesterday.  Less short of breath.  Cough is improving.  No chest pain.  No further episodes of diarrhea.  Denies any nausea or vomiting.      Assessment/Plan:  Acute Hypoxic Resp. Failure/Pneumonia due to COVID-19    Recent Labs  Lab 09/21/19 2055 09/21/19 2057 09/22/19 0500 09/22/19 0512 09/23/19 0613  DDIMER 2.24*  --   --  1.68* 1.40*  FERRITIN  --  561*  --   --   --   CRP  --  9.1* 7.8*  --  5.2*  ALT 28  --   --  20 28  PROCALCITON <0.10  --   --   --   --     Objective findings: Fever: Afebrile in the last 24 hours.   Oxygen requirements: Currently on HFNC to 5 L/min.   Saturating in the mid 90s.  Wean down to 4 L.    COVID 19 Therapeutics: Antibacterials: None.  Procalcitonin less than 0.1. Remdesivir: Day 3 Steroids: Solu-Medrol Diuretics: None Actemra/Baricitinib: Baricitinib initiated on 8/22 PUD Prophylaxis: Pepcid DVT Prophylaxis:  Lovenox 40 mg daily  From a respiratory standpoint patient seems to be improving.  Her oxygen requirements have come down to 4 L.  Discussed with nursing staff who will continue to wean down as tolerated.  PT and OT evaluation.  Continue with incentive spirometry out of bed to chair. Continue with Remdesivir and steroids.  Patient also on baricitinib which will be continued for now.  CRP is improved to 5.2.  D-dimer is stable.  The treatment plan and use of medications and known side effects were discussed with patient/family. Some of the medications used are based on case reports/anecdotal data.  All other medications being used in the management of COVID-19 based on limited study data.  Complete risks and long-term side effects are unknown, however in the best clinical judgment they seem to be of some benefit.  Patient/family wanted to proceed with treatment options provided.  Hypokalemia Secondary to GI loss.  Continue to supplement.  Magnesium 2.5.   Acute diarrhea secondary to COVID-19 She has excoriation of her  skin around the perianal area.  Local skin care.  Antimotility agents if she has recurrence of her diarrhea.  Diarrhea seems to be subsiding.  Essential hypertension/Hyponatremia Holding her antihypertensives due to borderline low blood pressures.  Asymptomatic.  Noted to have hyponatremia as well.  Could be somewhat volume depleted due to her diarrhea.  We will give her normal saline infusion for 10 hours.  Hyperlipidemia Continue Crestor.  LFTs are normal.  Obesity Estimated body mass index is 34.27 kg/m as calculated from the following:   Height as of this encounter: 5\' 2"  (1.575 m).   Weight as of  this encounter: 85 kg.  DVT Prophylaxis: Lovenox Code Status: Full code Family Communication: Husband updated. Disposition Plan: Hopefully return home when improved.  Mobilize.  Status is: Inpatient  Remains inpatient appropriate because:IV treatments appropriate due to intensity of illness or inability to take PO and Inpatient level of care appropriate due to severity of illness   Dispo:  Patient From: Home  Planned Disposition: Home  Expected discharge date: 09/25/19  Medically stable for discharge: No    Medications:  Scheduled:  baricitinib  4 mg Oral QHS   enoxaparin (LOVENOX) injection  40 mg Subcutaneous QHS   famotidine  20 mg Oral Daily   liver oil-zinc oxide   Topical BID   methylPREDNISolone (SOLU-MEDROL) injection  40 mg Intravenous Q12H   rosuvastatin  10 mg Oral Daily   Continuous:  remdesivir 100 mg in NS 100 mL Stopped (09/23/19 1055)   ZOX:[id number]:acetaminophen, chlorpheniramine-HYDROcodone, guaiFENesin-dextromethorphan, ondansetron **OR** ondansetron (ZOFRAN) IV   Objective:  Vital Signs  Vitals:   09/23/19 0800 09/23/19 0855 09/23/19 0900 09/23/19 1000  BP: 109/67 109/67 104/79 (!) 95/51  Pulse: (!) 45 73 73 79  Resp: 20 (!) 22 14 20   Temp:  97.6 F (36.4 C)  98 F (36.7 C)  TempSrc:  Oral  Oral  SpO2: 95% 94% 93% 96%  Weight:      Height:        Intake/Output Summary (Last 24 hours) at 09/23/2019 1116 Last data filed at 09/22/2019 2043 Gross per 24 hour  Intake 100 ml  Output --  Net 100 ml   Filed Weights   09/21/19 2046  Weight: 85 kg    General appearance: Awake alert.  In no distress Resp: Improved effort.  Crackles bilateral bases.  No wheezing or rhonchi.   Cardio: S1-S2 is normal regular.  No S3-S4.  No rubs murmurs or bruit GI: Abdomen is soft.  Nontender nondistended.  Bowel sounds are present normal.  No masses organomegaly Extremities: No edema.  Full range of motion of lower extremities. Neurologic: A.  No focal  neurological deficits.    Lab Results:  Data Reviewed: I have personally reviewed following labs and imaging studies  CBC: Recent Labs  Lab 09/21/19 2055 09/22/19 0512 09/23/19 0613  WBC 10.1 6.7 6.7  NEUTROABS 8.9* 6.1 5.4  HGB 14.7 12.3 13.1  HCT 44.6 36.8 39.8  MCV 87.5 89.1 89.6  PLT 324 244 309    Basic Metabolic Panel: Recent Labs  Lab 09/21/19 2055 09/22/19 0512 09/23/19 0613  NA 134* 138 131*  K 2.7* 3.5 3.4*  CL 88* 96* 92*  CO2 31 30 29   GLUCOSE 131* 134* 167*  BUN 28* 28* 29*  CREATININE 0.97 0.88 0.76  CALCIUM 8.0* 7.2* 7.7*  MG  --   --  2.5*    GFR: Estimated Creatinine Clearance: 65.3 mL/min (by C-G formula based on SCr  of 0.76 mg/dL).  Liver Function Tests: Recent Labs  Lab 09/21/19 2055 09/22/19 0512 09/23/19 0613  AST 34 28 37  ALT 28 20 28   ALKPHOS 66 54 56  BILITOT 1.5* 1.2 1.0  PROT 7.6 6.2* 6.6  ALBUMIN 3.3* 2.8* 2.9*     Coagulation Profile: Recent Labs  Lab 09/21/19 2055  INR 1.1    Lipid Profile: Recent Labs    09/21/19 2057  TRIG 144     Anemia Panel: Recent Labs    09/21/19 2057  FERRITIN 561*    Recent Results (from the past 240 hour(s))  Culture, blood (Routine x 2)     Status: None (Preliminary result)   Collection Time: 09/21/19  8:55 PM   Specimen: BLOOD  Result Value Ref Range Status   Specimen Description   Final    BLOOD BLOOD RIGHT FOREARM Performed at [facility], [address] [place], [place]    Special Requests   Final    BOTTLES DRAWN AEROBIC AND ANAEROBIC Blood Culture results may not be optimal due to an excessive volume of blood received in culture bottles Performed at [facility], [address] [place], [place]    Culture   Final    NO GROWTH 1 DAY Performed at [facility], [address] [place], [place]    Report Status PENDING  Incomplete  Culture, blood (Routine x 2)     Status: None  (Preliminary result)   Collection Time: 09/21/19  9:00 PM   Specimen: BLOOD  Result Value Ref Range Status   Specimen Description   Final    BLOOD RIGHT ANTECUBITAL Performed at [facility], [address], [place], [place]    Special Requests   Final    BOTTLES DRAWN AEROBIC AND ANAEROBIC Blood Culture adequate volume Performed at [facility], [address] [place], [place]    Culture   Final    NO GROWTH 1 DAY Performed at [facility], [address] [place], [place]    Report Status PENDING  Incomplete  SARS Coronavirus 2 by RT PCR (hospital order, performed in [facility] hospital lab) Nasopharyngeal Nasopharyngeal Swab     Status: Abnormal   Collection Time: 09/21/19  9:07 PM   Specimen: Nasopharyngeal Swab  Result Value Ref Range Status   SARS Coronavirus 2 POSITIVE (A) NEGATIVE Final    Comment: RESULT CALLED TO, READ BACK BY AND VERIFIED WITH: J,NASH AT 2340 ON 09/21/19 BY A,MOHAMED (NOTE) SARS-CoV-2 target nucleic acids are DETECTED  SARS-CoV-2 RNA is generally detectable in upper respiratory specimens  during the acute phase of infection.  Positive results are indicative  of the presence of the identified virus, but do not rule out bacterial infection or co-infection with other pathogens not detected by the test.  Clinical correlation with patient history and  other diagnostic information is necessary to determine patient infection status.  The expected result is negative.  Fact Sheet for Patients:   09/23/19   Fact Sheet for Healthcare Providers:   BoilerBrush.com.cy    This test is not yet approved or cleared by the https://pope.com/ FDA and  has been authorized for detection and/or diagnosis of SARS-CoV-2 by FDA under an Emergency Use Authorization (EUA).  This EUA will remain in effect (meaning thi s test can be used) for the  duration of  the COVID-19 declaration under Section 564(b)(1)  of the Act, 21 U.S.C. section 360-bbb-3(b)(1), unless the authorization is terminated or revoked sooner.  Performed at [facility], [address], [place], [place] [address]       Radiology Studies: [facility] Chest Port 1 View  Result Date: 09/21/2019 CLINICAL DATA:  COVID pneumonia EXAM: PORTABLE CHEST 1 VIEW COMPARISON:  None. FINDINGS: Lung volumes are small. Patchy infiltrate is seen within the right mid and lower lung zone, likely infectious or inflammatory in the acute setting. No pneumothorax or pleural effusion. Cardiac size within normal limits. Pulmonary vascularity is normal. IMPRESSION: Patchy infiltrate within the right mid and lower lung zone, likely infectious or inflammatory in the acute setting. Electronically Signed   By: Helyn Numbers MD   On: 09/21/2019 21:57       LOS: 2 days   Osvaldo Shipper  [facility] Pager on [email or web address]  09/23/2019, 11:16 AM

## 2019-09-23 NOTE — ED Notes (Signed)
Pt transferred onto hospital bed.

## 2019-09-23 NOTE — Evaluation (Signed)
Physical Therapy Evaluation Patient Details Name: Donna [name] MRN: [id number] DOB: Jun 06, 1950 Today's Date: 09/23/2019   History of Present Illness  69 year old Caucasian female with a past medical history of essential hypertension who was diagnosed with COVID-19 on August 14 when she was in [place].  Monoclonal antibody was recommended but did not receive it.  Presented to the ED 09/21/19  with shortness of breath, diarrhea, pneumonia on chest x-ray, and found to be hypoxic.  Clinical Impression  The patient calling out due to having had diarrhea. Assisted patient with hygiene. Patient mobilized to sitting and stood x 2 at bed edge. Unable to ambulate with multiple lines tethering patient close to the bed.. Patient resides a t home with family and should progress to return. Patient is on 3 L [place](large bore). Spo2 > 93% throughout mobility. Pt admitted with above diagnosis. Pt currently with functional limitations due to the deficits listed below (see PT Problem List). Pt will benefit from skilled PT to increase their independence and safety with mobility to allow discharge to the venue listed below.   Patient relates much discomfort in peri area from  Diarrhea.      Follow Up Recommendations Home health PT (if indicated at Dc.)    Equipment Recommendations  None recommended by PT    Recommendations for Other Services       Precautions / Restrictions Precautions Precaution Comments: diarrhea, monitor sats, on 3 L      Mobility  Bed Mobility Overal bed mobility: Needs Assistance Bed Mobility: Sidelying to Sit;Sit to Supine   Sidelying to sit: Min assist;HOB elevated   Sit to supine: HOB elevated;Min guard   General bed mobility comments: mion assist to sit up, gets back into bed, able to scoot to middle.  Transfers Overall transfer level: Needs assistance Equipment used: 1 person hand held assist Transfers: Sit to/from Stand Sit to Stand: Min assist         General transfer  comment: stood and took 2 side steps along the bed.  Ambulation/Gait             General Gait Details: TBA  Stairs            Wheelchair Mobility    Modified Rankin (Stroke Patients Only)       Balance Overall balance assessment: No apparent balance deficits (not formally assessed)                                           Pertinent Vitals/Pain Pain Assessment: Faces Faces Pain Scale: Hurts worst Pain Location: periarea, recent bout of diarrhea Pain Descriptors / Indicators: Burning;Constant;Nagging;Discomfort;Grimacing;Guarding;Moaning Pain Intervention(s): Repositioned (washed and placed creasm)    Home Living Family/patient expects to be discharged to:: Private residence Living Arrangements: Spouse/significant other;Children;Other relatives Available Help at Discharge: Family Type of Home: House Home Access: Stairs to enter   Secretary/administrator of Steps: 2 Home Layout: Two level;Bed/bath upstairs;1/2 bath on main level Home Equipment: None      Prior Function Level of Independence: Independent               Hand Dominance   Dominant Hand: Right    Extremity/Trunk Assessment   Upper Extremity Assessment Upper Extremity Assessment: Overall WFL for tasks assessed    Lower Extremity Assessment Lower Extremity Assessment: Generalized weakness    Cervical / Trunk Assessment Cervical / Trunk Assessment: Normal  Communication   Communication: No difficulties  Cognition Arousal/Alertness: Awake/alert Behavior During Therapy: WFL for tasks assessed/performed;Anxious;Restless Overall Cognitive Status: Within Functional Limits for tasks assessed                                 General Comments: frequently asking about periarea being clean after BM incontinence, repeatedly asks what therapist said.      General Comments      Exercises     Assessment/Plan    PT Assessment Patient needs continued PT  services  PT Problem List Decreased strength;Decreased knowledge of use of DME;Decreased activity tolerance;Decreased safety awareness;Pain;Decreased mobility       PT Treatment Interventions DME instruction;Gait training;Stair training;Functional mobility training;Therapeutic activities;Therapeutic exercise;Patient/family education    PT Goals (Current goals can be found in the Care Plan section)  Acute Rehab PT Goals Patient Stated Goal: to go home PT Goal Formulation: With patient Time For Goal Achievement: 10/07/19 Potential to Achieve Goals: Good    Frequency Min 3X/week   Barriers to discharge        Co-evaluation               AM-PAC PT "6 Clicks" Mobility  Outcome Measure Help needed turning from your back to your side while in a flat bed without using bedrails?: A Little Help needed moving from lying on your back to sitting on the side of a flat bed without using bedrails?: A Little Help needed moving to and from a bed to a chair (including a wheelchair)?: A Little Help needed standing up from a chair using your arms (e.g., wheelchair or bedside chair)?: A Little Help needed to walk in hospital room?: A Lot Help needed climbing 3-5 steps with a railing? : A Lot 6 Click Score: 16    End of Session   Activity Tolerance: Patient limited by pain Patient left: in bed;with call bell/phone within reach Nurse Communication: Mobility status PT Visit Diagnosis: Unsteadiness on feet (R26.81);Difficulty in walking, not elsewhere classified (R26.2)    Time: 1330-1416 PT Time Calculation (min) (ACUTE ONLY): 46 min   Charges:   PT Evaluation $PT Eval Moderate Complexity: 1 Mod PT Treatments $Therapeutic Activity: 8-22 mins $Self Care/Home Management: 8-22        Blanchard Kelch PT Acute Rehabilitation Services Pager [phone] Office [phone]   Rada Hay 09/23/2019, 3:15 PM

## 2019-09-23 NOTE — ED Notes (Signed)
Meal provided to patient

## 2019-09-24 LAB — COMPREHENSIVE METABOLIC PANEL
ALT: 26 U/L (ref 0–44)
AST: 31 U/L (ref 15–41)
Albumin: 2.7 g/dL — ABNORMAL LOW (ref 3.5–5.0)
Alkaline Phosphatase: 46 U/L (ref 38–126)
Anion gap: 10 (ref 5–15)
BUN: 25 mg/dL — ABNORMAL HIGH (ref 8–23)
CO2: 27 mmol/L (ref 22–32)
Calcium: 7.6 mg/dL — ABNORMAL LOW (ref 8.9–10.3)
Chloride: 99 mmol/L (ref 98–111)
Creatinine, Ser: 0.64 mg/dL (ref 0.44–1.00)
GFR calc Af Amer: >60 mL/min (ref >60)
GFR calc non Af Amer: >60 mL/min (ref >60)
Glucose, Bld: 181 mg/dL — ABNORMAL HIGH (ref 70–99)
Potassium: 3.7 mmol/L (ref 3.5–5.1)
Sodium: 136 mmol/L (ref 135–145)
Total Bilirubin: 0.9 mg/dL (ref 0.3–1.2)
Total Protein: 5.7 g/dL — ABNORMAL LOW (ref 6.5–8.1)

## 2019-09-24 LAB — CBC WITH DIFFERENTIAL/PLATELET
Abs Immature Granulocytes: 0.16 10*3/uL — ABNORMAL HIGH (ref 0.00–0.07)
Basophils Absolute: 0 10*3/uL (ref 0.0–0.1)
Basophils Relative: 0 %
Eosinophils Absolute: 0 10*3/uL (ref 0.0–0.5)
Eosinophils Relative: 0 %
HCT: 37.4 % (ref 36.0–46.0)
Hemoglobin: 12.2 g/dL (ref 12.0–15.0)
Immature Granulocytes: 2 %
Lymphocytes Relative: 10 %
Lymphs Abs: 1 10*3/uL (ref 0.7–4.0)
MCH: 29.5 pg (ref 26.0–34.0)
MCHC: 32.6 g/dL (ref 30.0–36.0)
MCV: 90.3 fL (ref 80.0–100.0)
Monocytes Absolute: 0.4 10*3/uL (ref 0.1–1.0)
Monocytes Relative: 4 %
Neutro Abs: 8.5 10*3/uL — ABNORMAL HIGH (ref 1.7–7.7)
Neutrophils Relative %: 84 %
Platelets: 322 10*3/uL (ref 150–400)
RBC: 4.14 MIL/uL (ref 3.87–5.11)
RDW: 12.9 % (ref 11.5–15.5)
WBC: 10.1 10*3/uL (ref 4.0–10.5)
nRBC: 0 % (ref 0.0–0.2)

## 2019-09-24 LAB — D-DIMER, QUANTITATIVE: D-Dimer, Quant: 0.92 ug/mL-FEU — ABNORMAL HIGH (ref 0.00–0.50)

## 2019-09-24 LAB — C-REACTIVE PROTEIN: CRP: 2.6 mg/dL — ABNORMAL HIGH (ref <1.0)

## 2019-09-24 MED ORDER — PANTOPRAZOLE SODIUM 40 MG IV SOLR
40.0000 mg | Freq: Every day | INTRAVENOUS | Status: DC
  Administered 2019-09-25 (×2): 40 mg via INTRAVENOUS
  Filled 2019-09-24 (×2): qty 40

## 2019-09-24 NOTE — Progress Notes (Signed)
Occupational Therapy Evaluation  Patient demonstrates mod I with functional transfer from recliner, I with LB dressing task and maintaining O2 saturations 94-96% on 2L throughout evaluation. Patient reports feeling much better from admission "I didn't even know where I was" and is excited to go home Friday. Educated patient on energy conservation and recommendation for shower chair. Patient reports her DTR would be able to pick one up for her. No further acute OT needs at this time, will sign off.     09/24/19 1219  OT Visit Information  Last OT Received On 09/24/19  Assistance Needed +1  History of Present Illness 69 year old Caucasian female with a past medical history of essential hypertension who was diagnosed with COVID-19 on August 14 when she was in [place].  Monoclonal antibody was recommended but did not receive it.  Presented to the ED 09/21/19  with shortness of breath, diarrhea, pneumonia on chest x-ray, and found to be hypoxic.  Precautions  Precautions None  Precaution Comments on 2L  Restrictions  Weight Bearing Restrictions No  Home Living  Family/patient expects to be discharged to: Private residence  Living Arrangements Spouse/significant other;Children;Other relatives  Available Help at Discharge Family  Type of Home House  Home Access Stairs to enter  Entrance Stairs-Number of Steps 2  Home Layout Two level;Bed/bath upstairs;1/2 bath on main level  Alternate Level Stairs-Number of Steps flight  Alternate Level Stairs-Rails Right  Bathroom Shower/Tub Walk-in Corporate treasurer None  Prior Function  Level of Independence Independent  Communication  Communication No difficulties  Pain Assessment  Pain Assessment No/denies pain  Cognition  Arousal/Alertness Awake/alert  Behavior During Therapy WFL for tasks assessed/performed;Anxious;Restless  Overall Cognitive Status Within Functional Limits for tasks assessed  General Comments  patient excited she was told going home Friday  Upper Extremity Assessment  Upper Extremity Assessment Overall WFL for tasks assessed  Lower Extremity Assessment  Lower Extremity Assessment Defer to PT evaluation  Cervical / Trunk Assessment  Cervical / Trunk Assessment Normal  ADL  Overall ADL's  Modified independent  General ADL Comments patient has been transferring from recliner to bedside commode as needed, patient demo ability to perform LB dressing without assistance. patient reports feeling much better today than on admission "I didn't even know where I was"  Bed Mobility  General bed mobility comments in recliner  Transfers  Overall transfer level Modified independent  Equipment used None  Transfers Sit to/from Stand  Sit to Stand Modified independent (Device/Increase time)  General transfer comment patient stood from recliner without any physical assistance  Balance  Overall balance assessment No apparent balance deficits (not formally assessed)  General Comments  General comments (skin integrity, edema, etc.) patient maintained 94-96% on 2L O2, HR mid 60s throughtout evaluation  OT - End of Session  Equipment Utilized During Treatment Oxygen  Activity Tolerance Patient tolerated treatment well  Patient left in chair;with call bell/phone within reach  Nurse Communication Mobility status  OT Assessment  OT Recommendation/Assessment Patient does not need any further OT services  OT Visit Diagnosis Other abnormalities of gait and mobility (R26.89)  OT Problem List Decreased activity tolerance  AM-PAC OT "6 Clicks" Daily Activity Outcome Measure (Version 2)  Help from another person eating meals? 4  Help from another person taking care of personal grooming? 4  Help from another person toileting, which includes using toliet, bedpan, or urinal? 4  Help from another person bathing (including washing, rinsing, drying)? 4  Help from  another person to put on and taking off regular  upper body clothing? 4  Help from another person to put on and taking off regular lower body clothing? 4  6 Click Score 24  OT Recommendation  Follow Up Recommendations No OT follow up  OT Equipment Tub/shower seat  OT Time Calculation  OT Start Time (ACUTE ONLY) 1143  OT Stop Time (ACUTE ONLY) 1206  OT Time Calculation (min) 23 min  OT General Charges  $OT Visit 1 Visit  OT Evaluation  $OT Eval Low Complexity 1 Low  OT Treatments  $Self Care/Home Management  8-22 mins  Written Expression  Dominant Hand Right   Marlyce Huge OT OT pager: [phone]

## 2019-09-24 NOTE — Care Management Important Message (Signed)
Important Message  Patient Details IM Letter given to the Patient Name: Donna [name] MRN: [id number] Date of Birth: Oct 20, 1950   Medicare Important Message Given:  Yes     Caren Macadam 09/24/2019, 10:40 AM

## 2019-09-24 NOTE — Progress Notes (Signed)
PROGRESS NOTE  Donna [name]  DOB: 1950-05-03  PCP: Mliss Sax, MD IPJ:[id number]  DOA: 09/21/2019  LOS: 3 days   Chief Complaint  Patient presents with  . COVID Positive  . Shortness of Breath   Brief narrative: Donna [name] is a 69 y.o. female with PMH of essential hypertension who was diagnosed with COVID-19 on August 14 when she was in [place].  Monoclonal antibody was recommended but she ended up not getting it. She presented to the ED on 8/22 with complaint of worsening shortness of breath, diarrhea.   In the ED, patient was noted to be hypoxic. Chest x-ray showed right-sided pneumonia.   Patient was admitted for Covid pneumonia.  Subjective: Patient was seen and examined this morning. Feels better.  She was on 2 L oxygen at rest. Diarrhea improved. Labs from this morning showed improving inflammatory markers.  Assessment/Plan: COVID pneumonia Acute respiratory failure with hypoxia  -Presented with worsening shortness of breath -COVID test: PCR positive on 8/14 as an outpatient -Chest imaging: Chest x-ray on admission showed right-sided pneumonia -Treatment: Currently on a 5-day course of remdesivir to complete on 8/26.  Continue Solu-Medrol 40 mg IV twice daily.  Also on baricitinib since 8/22.  -Supportive care: Vitamin C, Zinc, inhalers, Tylenol, Antitussives (benzonatate/ Mucinex/Tussionex), incentive spirometry. -continue Protonix while on steroids. -Progression: Continues to improve.  She was on 2 L oxygen at rest this morning.  This afternoon, she was able to ambulate without oxygen with desaturation to 87% but without symptoms. -Oxygen - SpO2: 92 % O2 Flow Rate (L/min): 2 L/min -Continue airborne/contact isolation precautions. -WBC, lactic acid, procalcitonin and inflammatory markers trend as below with improvement in CRP and D-dimer.  Lab Results  Component Value Date   SARSCOV2NAA POSITIVE (A) 09/21/2019    Recent Labs  Lab 09/21/19 2055  09/21/19 2057 09/22/19 0500 09/22/19 0512 09/23/19 0613 09/24/19 0438  WBC 10.1  --   --  6.7 6.7 10.1  LATICACIDVEN 1.7  --   --  1.1  --   --   PROCALCITON <0.10  --   --   --   --   --   DDIMER 2.24*  --   --  1.68* 1.40* 0.92*  FERRITIN  --  561*  --   --   --   --   LDH 281*  --   --   --   --   --   CRP  --  9.1* 7.8*  --  5.2* 2.6*  ALT 28  --   --  20 28 26     The treatment plan and use of medications and known side effects were discussed with patient/family. Some of the medications used are based on case reports/anecdotal data.  All other medications being used in the management of COVID-19 based on limited study data.  Complete risks and long-term side effects are unknown, however in the best clinical judgment they seem to be of some benefit.  Patient wanted to proceed with treatment options provided.  Acute diarrhea secondary to COVID-19 -Secondary to Covid.  Diarrhea is improving.    Essential hypertension Hyponatremia -Home meds include Toprol 50 mg daily, amlodipine 10 mg daily, chlorthalidone 25 mg daily,  Aldactone 25 mg daily.   -Currently all the blood pressure medications are on hold.  Heart rate is mostly bradycardic and blood pressure mostly normal range.  Continue to monitor.  Hyperlipidemia Continue Crestor.  LFTs are normal.  Mobility: Poor Code Status:   Code Status:  Full Code  Nutritional status: Body mass index is 34.27 kg/m.     Diet Order            Diet 2 gram sodium Room service appropriate? Yes; Fluid consistency: Thin  Diet effective now                 DVT prophylaxis: enoxaparin (LOVENOX) injection 40 mg Start: 09/21/19 2245   Antimicrobials:  None Fluid: None  Consultants: None Family Communication:  None at bedside  Status is: Inpatient  Remains inpatient appropriate because:Ongoing diagnostic testing needed not appropriate for outpatient work up and IV treatments appropriate due to intensity of illness or inability to  take PO   Dispo:  Patient From: Home  Planned Disposition: Home  Expected discharge date: 09/25/19  Medically stable for discharge: No        Infusions:  . remdesivir 100 mg in NS 100 mL 100 mg (09/24/19 1046)    Scheduled Meds: . baricitinib  4 mg Oral QHS  . enoxaparin (LOVENOX) injection  40 mg Subcutaneous QHS  . famotidine  20 mg Oral Daily  . liver oil-zinc oxide   Topical BID  . methylPREDNISolone (SOLU-MEDROL) injection  40 mg Intravenous Q12H  . rosuvastatin  10 mg Oral Daily    Antimicrobials: Anti-infectives (From admission, onward)   Start     Dose/Rate Route Frequency Ordered Stop   09/22/19 1000  remdesivir 100 mg in sodium chloride 0.9 % 100 mL IVPB       "Followed by" Linked Group Details   100 mg 200 mL/hr over 30 Minutes Intravenous Daily 09/21/19 2234 09/26/19 0959   09/21/19 2300  remdesivir 200 mg in sodium chloride 0.9% 250 mL IVPB       "Followed by" Linked Group Details   200 mg 580 mL/hr over 30 Minutes Intravenous Once 09/21/19 2234 09/22/19 0029      PRN meds: acetaminophen, chlorpheniramine-HYDROcodone, guaiFENesin-dextromethorphan, ondansetron **OR** ondansetron (ZOFRAN) IV   Objective: Vitals:   09/24/19 1321 09/24/19 1712  BP: 117/79 125/74  Pulse: 62 61  Resp: 19 20  Temp: 98.1 F (36.7 C) 98.6 F (37 C)  SpO2: 96% 92%    Intake/Output Summary (Last 24 hours) at 09/24/2019 1737 Last data filed at 09/24/2019 1044 Gross per 24 hour  Intake 360 ml  Output --  Net 360 ml   Filed Weights   09/21/19 2046  Weight: 85 kg   Weight change:  Body mass index is 34.27 kg/m.   Physical Exam: General exam: Appears calm and comfortable.  Not in physical distress Skin: No rashes, lesions or ulcers. HEENT: Atraumatic, normocephalic, supple neck, no obvious bleeding Lungs: Clear to auscultation bilaterally CVS: Regular rate and rhythm, no murmur GI/Abd soft, nontender, nondistended, bowel sound present CNS:  Alert, awake,  oriented x3 Psychiatry: Mood appropriate Extremities: No pedal edema, no calf tenderness  Data Review: I have personally reviewed the laboratory data and studies available.  Recent Labs  Lab 09/21/19 2055 09/22/19 0512 09/23/19 0613 09/24/19 0438  WBC 10.1 6.7 6.7 10.1  NEUTROABS 8.9* 6.1 5.4 8.5*  HGB 14.7 12.3 13.1 12.2  HCT 44.6 36.8 39.8 37.4  MCV 87.5 89.1 89.6 90.3  PLT 324 244 309 322   Recent Labs  Lab 09/21/19 2055 09/22/19 0512 09/23/19 0613 09/24/19 0438  NA 134* 138 131* 136  K 2.7* 3.5 3.4* 3.7  CL 88* 96* 92* 99  CO2 31 30 29 27   GLUCOSE 131* 134* 167* 181*  BUN  28* 28* 29* 25*  CREATININE 0.97 0.88 0.76 0.64  CALCIUM 8.0* 7.2* 7.7* 7.6*  MG  --   --  2.5*  --    Lab Results  Component Value Date   HGBA1C 5.7 05/20/2019       Component Value Date/Time   CHOL 178 05/20/2019 1427   TRIG 144 09/21/2019 2057   HDL 56.90 05/20/2019 1427   CHOLHDL 3 05/20/2019 1427   VLDL 29.4 05/20/2019 1427   LDLCALC 91 05/20/2019 1427   LDLDIRECT 104.0 05/20/2019 1427    Signed, Lorin Glass, MD [facility] Pager: [phone] (Secure Chat preferred). 09/24/2019

## 2019-09-24 NOTE — Progress Notes (Signed)
Pt ambulated in room to bathroom and completed bath without O2 for about 20 minutes sats on RA 87%....recovering at rest on 90%. no shortness of breath, able to talk while walking. O2 Sat on RA after 15 minutes 92% at rest.    SRP, RN

## 2019-09-24 NOTE — Progress Notes (Signed)
Patient stated to this RN that she did not have a "white piece that attaches to the side of the phone". RN looked in room,under the bed,in the bed and went through 6 bags of dirty linen (because the patient thought that the "white piece" may have been taken up in the linen when the bed was changed) The "white piece" was not found. Will keep a look out for this item.Marland Kitchen

## 2019-09-25 LAB — CBC WITH DIFFERENTIAL/PLATELET
Abs Immature Granulocytes: 0.12 10*3/uL — ABNORMAL HIGH (ref 0.00–0.07)
Basophils Absolute: 0 10*3/uL (ref 0.0–0.1)
Basophils Relative: 0 %
Eosinophils Absolute: 0 10*3/uL (ref 0.0–0.5)
Eosinophils Relative: 0 %
HCT: 37.5 % (ref 36.0–46.0)
Hemoglobin: 12.3 g/dL (ref 12.0–15.0)
Immature Granulocytes: 1 %
Lymphocytes Relative: 9 %
Lymphs Abs: 0.8 10*3/uL (ref 0.7–4.0)
MCH: 29.5 pg (ref 26.0–34.0)
MCHC: 32.8 g/dL (ref 30.0–36.0)
MCV: 89.9 fL (ref 80.0–100.0)
Monocytes Absolute: 0.5 10*3/uL (ref 0.1–1.0)
Monocytes Relative: 5 %
Neutro Abs: 7.5 10*3/uL (ref 1.7–7.7)
Neutrophils Relative %: 85 %
Platelets: 329 10*3/uL (ref 150–400)
RBC: 4.17 MIL/uL (ref 3.87–5.11)
RDW: 12.7 % (ref 11.5–15.5)
WBC: 8.9 10*3/uL (ref 4.0–10.5)
nRBC: 0 % (ref 0.0–0.2)

## 2019-09-25 LAB — COMPREHENSIVE METABOLIC PANEL
ALT: 31 U/L (ref 0–44)
AST: 33 U/L (ref 15–41)
Albumin: 2.9 g/dL — ABNORMAL LOW (ref 3.5–5.0)
Alkaline Phosphatase: 46 U/L (ref 38–126)
Anion gap: 9 (ref 5–15)
BUN: 25 mg/dL — ABNORMAL HIGH (ref 8–23)
CO2: 29 mmol/L (ref 22–32)
Calcium: 8.1 mg/dL — ABNORMAL LOW (ref 8.9–10.3)
Chloride: 97 mmol/L — ABNORMAL LOW (ref 98–111)
Creatinine, Ser: 0.67 mg/dL (ref 0.44–1.00)
GFR calc Af Amer: >60 mL/min (ref >60)
GFR calc non Af Amer: >60 mL/min (ref >60)
Glucose, Bld: 170 mg/dL — ABNORMAL HIGH (ref 70–99)
Potassium: 3.5 mmol/L (ref 3.5–5.1)
Sodium: 135 mmol/L (ref 135–145)
Total Bilirubin: 1.1 mg/dL (ref 0.3–1.2)
Total Protein: 6 g/dL — ABNORMAL LOW (ref 6.5–8.1)

## 2019-09-25 LAB — D-DIMER, QUANTITATIVE: D-Dimer, Quant: 0.84 ug/mL-FEU — ABNORMAL HIGH (ref 0.00–0.50)

## 2019-09-25 LAB — C-REACTIVE PROTEIN: CRP: 1.6 mg/dL — ABNORMAL HIGH (ref <1.0)

## 2019-09-25 MED ORDER — GUAIFENESIN-DM 100-10 MG/5ML PO SYRP: 10.0000 mL | ORAL_SOLUTION | ORAL | 0 refills | Status: DC | PRN

## 2019-09-25 MED ORDER — PANTOPRAZOLE SODIUM 40 MG PO TBEC: 40.0000 mg | DELAYED_RELEASE_TABLET | Freq: Every day | ORAL | 0 refills | Status: DC

## 2019-09-25 MED ORDER — PREDNISONE 10 MG PO TABS: ORAL_TABLET | ORAL | 0 refills | Status: DC

## 2019-09-25 MED ORDER — PANTOPRAZOLE SODIUM 40 MG PO TBEC: 40.0000 mg | DELAYED_RELEASE_TABLET | Freq: Every day | ORAL | Status: DC

## 2019-09-25 NOTE — Discharge Summary (Signed)
Physician Discharge Summary  [name] UJW:[id number] DOB: 1951/01/29 DOA: 09/21/2019  PCP: Mliss Sax, MD  Admit date: 09/21/2019 Discharge date: 09/25/2019  Admitted From: Home Discharge disposition: Home   Code Status: Full Code  Diet Recommendation: Cardiac diet  Discharge Diagnosis:   Principal Problem:   Acute hypoxemic respiratory failure due to COVID-19 [name]) Active Problems:   Essential hypertension   Hypokalemia   History of Present Illness / Brief narrative:  Donna [name] is a 69 y.o. female with PMH of essential hypertension who was diagnosed with COVID-19 on August 14 when she was in [place]. Monoclonal antibody was recommended but she ended up not getting it. She presented to the ED on 8/22 with complaint of worsening shortness of breath, diarrhea.   In the ED, patient was noted to be hypoxic. Chest x-ray showed right-sided pneumonia.   Patient was admitted for Covid pneumonia.  Hospital Course:   COVID pneumonia Acute respiratory failure with hypoxia  -Presented with worsening shortness of breath -COVID test: PCR positive on 8/14 as an outpatient -Chest imaging: Chest x-ray on admission showed right-sided pneumonia -Treatment: Currently on a 5-day course of remdesivir to complete on 8/26. Currently on Solu-Medrol 40 mg IV twice daily.  Switch to prednisone to discharge on a tapering course.  Received baricitinib since 8/22 till discharge.  -continue Protonix while on steroids. -Supportive care: Vitamin C, Zinc, inhalers, Tylenol, Antitussives (benzonatate/ Mucinex/Tussionex), incentive spirometry. -Patient is significantly improved.  Not on supplemental oxygen at rest or on ambulation at this time.  Okay to discharge home today. -Continue airborne/contact isolation precautions for 3 weeks since diagnosis. -WBC and inflammatory markers trend as below with improvement in CRP and D-dimer.  Lab Results  Component Value Date   SARSCOV2NAA POSITIVE  (A) 09/21/2019    Recent Labs  Lab 09/21/19 2055 09/21/19 2057 09/22/19 0500 09/22/19 0512 09/23/19 0613 09/24/19 0438 09/25/19 0403  WBC 10.1  --   --  6.7 6.7 10.1 8.9  LATICACIDVEN 1.7  --   --  1.1  --   --   --   PROCALCITON <0.10  --   --   --   --   --   --   DDIMER 2.24*  --   --  1.68* 1.40* 0.92* 0.84*  FERRITIN  --  561*  --   --   --   --   --   LDH 281*  --   --   --   --   --   --   CRP  --  9.1* 7.8*  --  5.2* 2.6* 1.6*  ALT 28  --   --  20 28 26 31    The treatment plan and use of medications and known side effects were discussed with patient/family. Some of the medications used are based on case reports/anecdotal data. All other medications being used in the management of COVID-19 based on limited study data. Complete risks and long-term side effects are unknown, however in the best clinical judgment they seem to be of some benefit. Patientwanted to proceed with treatment options provided.  Acute diarrhea secondary to COVID-19 -Secondary to Covid.  Diarrhea improved  Essential hypertension Hyponatremia -Patient states that as an outpatient recently, her blood pressure was running lower than normal and hence her primary care provider was cutting down on her medications.  Prior to admission, home meds included Toprol 50 mg daily, amlodipine 10 mg daily, chlorthalidone 25 mg daily,  Aldactone 25 mg daily.   -  While in the hospital, patient's heart rate and blood pressure remained on the lower side of normal.  All of her medicines are currently on hold.  Patient denies any history of A. fib or any other arrhythmia.  Despite being off metoprolol for last 5 days, she continues to have heart rate in 50s and 60s. At discharge, I would not resume any of her blood pressure medications.  I have instructed the patient to monitor her blood pressure at home.  If it exceeds 140/90, she can resume HCTZ and amlodipine one at a time.    Hyperlipidemia Continue Crestor. LFTs are  normal.  Mobility: Poor Code Status:  Code Status: Full Code   Wound care: Incision - 4 Ports Abdomen 1: Upper;Mid 2: Umbilicus 3: Right;Mid;Upper 4: Right;Upper;Lateral (Active)  Placement Date: 05/18/17   Location of Ports: Abdomen  Port: 1:  Location Orientation: Upper;Mid  Port: 2:  Location Orientation: Umbilicus  Port: 3:  Location Orientation: Right;Mid;Upper  Port: 4:  Location Orientation: Right;Upper;Lateral    Assessments 05/18/2017  1:25 PM 05/22/2017  8:23 AM  Port 1 Site Assessment -- [employer] 1 Margins -- Attached edges (approximated)  Port 1 Drainage Amount -- None  Port 1 Dressing Type Liquid skin adhesive Liquid skin adhesive  Port 1 Dressing Status -- Clean;Dry;Intact  Port 2 Site Assessment -- [employer] 2 Margins -- Attached edges (approximated)  Port 2 Drainage Amount -- None  Port 2 Dressing Type Liquid skin adhesive Liquid skin adhesive  Port 2 Dressing Status -- Clean;Dry;Intact  Port 3 Site Assessment -- Clean;Dry  Port 3 Margins -- Attached edges (approximated)  Port 3 Drainage Amount -- None  Port 3 Dressing Type Liquid skin adhesive Liquid skin adhesive  Port 3 Dressing Status -- Clean;Dry;Intact  Port 4 Site Assessment -- Clean;Dry  Port 4 Margins -- Attached edges (approximated)  Port 4 Drainage Amount -- None  Port 4 Dressing Type Liquid skin adhesive Liquid skin adhesive  Port 4 Dressing Status -- Clean;Dry;Intact     No Linked orders to display     Incision (Closed) 05/18/17 Abdomen (Active)  Date First Assessed/Time First Assessed: 05/18/17 1331   Location: Abdomen    Assessments 05/18/2017  1:38 PM 05/22/2017  8:23 AM  Dressing Type Liquid skin adhesive Liquid skin adhesive  Dressing Clean;Dry;Intact Clean;Dry;Intact  Site / Wound Assessment Clean;Dry Clean;Dry  Drainage Amount None None  Treatment Ice applied --     No Linked orders to display     Wound / Incision (Open or Dehisced) 09/22/19 (MASD) Moisture Associated Skin  Damage Buttocks (Active)  Date First Assessed: 09/22/19   Wound Type: (MASD) Moisture Associated Skin Damage  Location: Buttocks  Present on Admission: Yes    Assessments 09/22/2019  1:00 PM 09/24/2019  7:50 AM  % Wound base Red or Granulating 100% --  Wound Length (cm) 10 cm --  Wound Width (cm) 10 cm --  Wound Depth (cm) 0.1 cm --  Wound Volume (cm^3) 10 cm^3 --  Wound Surface Area (cm^2) 100 cm^2 --  Treatment -- Cleansed     No Linked orders to display     Wound / Incision (Open or Dehisced) 09/22/19 (MASD) Moisture Associated Skin Damage Buttocks (Active)  Date First Assessed: 09/22/19   Wound Type: (MASD) Moisture Associated Skin Damage  Location: Buttocks  Present on Admission: Yes    No assessment data to display     No Linked orders to display    Subjective:  Seen  and examined this morning.  Sitting up in chair.  Not in distress.  Not on supplemental oxygen.  Very grateful for the treatment she has received.  She says she is now open to taking vaccines.  Discharge Exam:   Vitals:   09/24/19 1321 09/24/19 1712 09/24/19 2042 09/25/19 0618  BP: 117/79 125/74 111/68 122/67  Pulse: 62 61  (!) 56  Resp: Temp: 98.1 F (36.7 C) 98.6 F (37 C) 98.2 F (36.8 C) 97.8 F (36.6 C)  TempSrc: Oral Oral Oral Oral  SpO2: 96% 92% 93% 93%  Weight:      Height:        Body mass index is 34.27 kg/m.  General exam: Appears calm and comfortable.  Not in physical distress Skin: No rashes, lesions or ulcers. HEENT: Atraumatic, normocephalic, supple neck, no obvious bleeding Lungs: Clear to auscultation bilaterally CVS: Regular rate and rhythm, no murmur GI/Abd soft, nontender, nondistended, bowel sound present CNS: Alert, awake, oriented x3 Psychiatry: Mood appropriate Extremities: No pedal edema, no calf tenderness  Follow ups:   Discharge Instructions    Diet - low sodium heart healthy   Complete by: As directed    Discharge wound care:   Complete by: As  directed    Apply Desitin to buttocks BID and PRN when turning and cleaning   Increase activity slowly   Complete by: As directed       Follow-up Information    Mliss Sax, MD Follow up.   Specialty: Family Medicine Contact information: [address] [place] [address] [phone]               Recommendations for Outpatient Follow-Up:   1. Follow-up with PCP as an outpatient  Discharge Instructions:  Follow with Primary MD Mliss Sax, MD in 7 days   Get CBC/BMP checked in next visit within 1 week by PCP or SNF MD ( we routinely change or add medications that can affect your baseline labs and fluid status, therefore we recommend that you get the mentioned basic workup next visit with your PCP, your PCP may decide not to get them or add new tests based on their clinical decision)  On your next visit with your PCP, please Get Medicines reviewed and adjusted.  Please request your PCP  to go over all Hospital Tests and Procedure/Radiological results at the follow up, please get all Hospital records sent to your Prim MD by signing hospital release before you go home.  Activity: As tolerated with Full fall precautions use walker/cane & assistance as needed  For Heart failure patients - Check your Weight same time everyday, if you gain over 2 pounds, or you develop in leg swelling, experience more shortness of breath or chest pain, call your Primary MD immediately. Follow Cardiac Low Salt Diet and 1.5 lit/day fluid restriction.  If you have smoked or chewed Tobacco in the last 2 yrs please stop smoking, stop any regular Alcohol  and or any Recreational drug use.  If you experience worsening of your admission symptoms, develop shortness of breath, life threatening emergency, suicidal or homicidal thoughts you must seek medical attention immediately by calling 911 or calling your MD immediately  if symptoms less severe.  You Must read complete  instructions/literature along with all the possible adverse reactions/side effects for all the Medicines you take and that have been prescribed to you. Take any new Medicines after you have completely understood and accpet all the  possible adverse reactions/side effects.   Do not drive, operate heavy machinery, perform activities at heights, swimming or participation in water activities or provide baby sitting services if your were admitted for syncope or siezures until you have seen by Primary MD or a Neurologist and advised to do so again.  Do not drive when taking Pain medications.  Do not take more than prescribed Pain, Sleep and Anxiety Medications  Wear Seat belts while driving.   Please note You were cared for by a hospitalist during your hospital stay. If you have any questions about your discharge medications or the care you received while you were in the hospital after you are discharged, you can call the unit and asked to speak with the hospitalist on call if the hospitalist that took care of you is not available. Once you are discharged, your primary care physician will handle any further medical issues. Please note that NO REFILLS for any discharge medications will be authorized once you are discharged, as it is imperative that you return to your primary care physician (or establish a relationship with a primary care physician if you do not have one) for your aftercare needs so that they can reassess your need for medications and monitor your lab values.    Allergies as of 09/25/2019      Reactions   Penicillins    Unsure of the reaction- happened around age [id number]      Medication List    STOP taking these medications   amLODipine 10 MG tablet Commonly known as: NORVASC   chlorthalidone 25 MG tablet Commonly known as: HYGROTON   metoprolol succinate 50 MG 24 hr tablet Commonly known as: TOPROL-XL   ondansetron 4 MG tablet Commonly known as: ZOFRAN     TAKE these  medications   aspirin EC 81 MG tablet Take 81 mg by mouth every other day.   guaiFENesin-dextromethorphan 100-10 MG/5ML syrup Commonly known as: ROBITUSSIN DM Take 10 mLs by mouth every 4 (four) hours as needed for cough.   pantoprazole 40 MG tablet Commonly known as: PROTONIX Take 1 tablet (40 mg total) by mouth daily for 14 days.   predniSONE 10 MG tablet Commonly known as: DELTASONE Take 6 tabs twice a day for 2 days, then take 4 tabs twice a day for 2 days, then take 4 tabs daily for 2 days, then take 3 tabs daily for 2 days, then take 2 tabs daily for 2 days, then take 1 tab daily for 2 days, then STOP.   rosuvastatin 10 MG tablet Commonly known as: CRESTOR Take 1 tablet (10 mg total) by mouth daily.   spironolactone 25 MG tablet Commonly known as: ALDACTONE Take 1 tablet (25 mg total) by mouth daily.   triamcinolone ointment 0.1 % Commonly known as: KENALOG Apply a thin strip to fissures on finger tips once daily as needed.            Discharge Care Instructions  (From admission, onward)         Start     Ordered   09/25/19 0000  Discharge wound care:       Comments: Apply Desitin to buttocks BID and PRN when turning and cleaning   09/25/19 1152          Time coordinating discharge: 35 minutes  The results of significant diagnostics from this hospitalization (including imaging, microbiology, ancillary and laboratory) are listed below for reference.    Procedures and Diagnostic Studies:   DG Chest  Port 1 View  Result Date: 09/21/2019 CLINICAL DATA:  COVID pneumonia EXAM: PORTABLE CHEST 1 VIEW COMPARISON:  None. FINDINGS: Lung volumes are small. Patchy infiltrate is seen within the right mid and lower lung zone, likely infectious or inflammatory in the acute setting. No pneumothorax or pleural effusion. Cardiac size within normal limits. Pulmonary vascularity is normal. IMPRESSION: Patchy infiltrate within the right mid and lower lung zone, likely infectious  or inflammatory in the acute setting. Electronically Signed   By: Helyn Numbers MD   On: 09/21/2019 21:57     Labs:   Basic Metabolic Panel: Recent Labs  Lab 09/21/19 2055 09/21/19 2055 09/22/19 8889 09/22/19 1694 09/23/19 5038 09/23/19 8828 09/24/19 0438 09/25/19 0403  NA 134*  --  138  --  131*  --  136 135  K 2.7*   < > 3.5   < > 3.4*   < > 3.7 3.5  CL 88*  --  96*  --  92*  --  99 97*  CO2 31  --  30  --  29  --  27 29  GLUCOSE 131*  --  134*  --  167*  --  181* 170*  BUN 28*  --  28*  --  29*  --  25* 25*  CREATININE 0.97  --  0.88  --  0.76  --  0.64 0.67  CALCIUM 8.0*  --  7.2*  --  7.7*  --  7.6* 8.1*  MG  --   --   --   --  2.5*  --   --   --    < > = values in this interval not displayed.   GFR Estimated Creatinine Clearance: 65.3 mL/min (by C-G formula based on SCr of 0.67 mg/dL). Liver Function Tests: Recent Labs  Lab 09/21/19 2055 09/22/19 0512 09/23/19 0613 09/24/19 0438 09/25/19 0403  AST 34 28 37 31 33  ALT 28 20 28 26 31   ALKPHOS 66 54 56 46 46  BILITOT 1.5* 1.2 1.0 0.9 1.1  PROT 7.6 6.2* 6.6 5.7* 6.0*  ALBUMIN 3.3* 2.8* 2.9* 2.7* 2.9*   No results for input(s): LIPASE, AMYLASE in the last 168 hours. No results for input(s): AMMONIA in the last 168 hours. Coagulation profile Recent Labs  Lab 09/21/19 2055  INR 1.1    CBC: Recent Labs  Lab 09/21/19 2055 09/22/19 0512 09/23/19 0613 09/24/19 0438 09/25/19 0403  WBC 10.1 6.7 6.7 10.1 8.9  NEUTROABS 8.9* 6.1 5.4 8.5* 7.5  HGB 14.7 12.3 13.1 12.2 12.3  HCT 44.6 36.8 39.8 37.4 37.5  MCV 87.5 89.1 89.6 90.3 89.9  PLT 324 244 309 322 329   Cardiac Enzymes: No results for input(s): CKTOTAL, CKMB, CKMBINDEX, TROPONINI in the last 168 hours. BNP: Invalid input(s): POCBNP CBG: No results for input(s): GLUCAP in the last 168 hours. D-Dimer Recent Labs    09/24/19 0438 09/25/19 0403  DDIMER 0.92* 0.84*   Hgb A1c No results for input(s): HGBA1C in the last 72 hours. Lipid  Profile No results for input(s): CHOL, HDL, LDLCALC, TRIG, CHOLHDL, LDLDIRECT in the last 72 hours. Thyroid function studies No results for input(s): TSH, T4TOTAL, T3FREE, THYROIDAB in the last 72 hours.  Invalid input(s): FREET3 Anemia work up No results for input(s): VITAMINB12, FOLATE, FERRITIN, TIBC, IRON, RETICCTPCT in the last 72 hours. Microbiology Recent Results (from the past 240 hour(s))  Culture, blood (Routine x 2)     Status: None (Preliminary result)   Collection  Time: 09/21/19  8:55 PM   Specimen: BLOOD  Result Value Ref Range Status   Specimen Description   Final    BLOOD BLOOD RIGHT FOREARM Performed at [facility], [address] [place], [place] [address]    Special Requests   Final    BOTTLES DRAWN AEROBIC AND ANAEROBIC Blood Culture results may not be optimal due to an excessive volume of blood received in culture bottles Performed at [facility], [address], [place], [place] [address]    Culture   Final    NO GROWTH 3 DAYS Performed at [facility], [address], [place], [place] [address]    Report Status PENDING  Incomplete  Culture, blood (Routine x 2)     Status: None (Preliminary result)   Collection Time: 09/21/19  9:00 PM   Specimen: BLOOD  Result Value Ref Range Status   Specimen Description   Final    BLOOD RIGHT ANTECUBITAL Performed at [facility], [address] [place], [place] [address]    Special Requests   Final    BOTTLES DRAWN AEROBIC AND ANAEROBIC Blood Culture adequate volume Performed at [facility], [address], [place], [place] [address]    Culture   Final    NO GROWTH 3 DAYS Performed at [facility], [address], [place], [place] [address]    Report Status PENDING  Incomplete  SARS Coronavirus 2 by RT PCR (hospital order, performed in [facility] hospital lab) Nasopharyngeal Nasopharyngeal Swab     Status: Abnormal    Collection Time: 09/21/19  9:07 PM   Specimen: Nasopharyngeal Swab  Result Value Ref Range Status   SARS Coronavirus 2 POSITIVE (A) NEGATIVE Final    Comment: RESULT CALLED TO, READ BACK BY AND VERIFIED WITH: J,NASH AT 2340 ON 09/21/19 BY A,MOHAMED (NOTE) SARS-CoV-2 target nucleic acids are DETECTED  SARS-CoV-2 RNA is generally detectable in upper respiratory specimens  during the acute phase of infection.  Positive results are indicative  of the presence of the identified virus, but do not rule out bacterial infection or co-infection with other pathogens not detected by the test.  Clinical correlation with patient history and  other diagnostic information is necessary to determine patient infection status.  The expected result is negative.  Fact Sheet for Patients:   BoilerBrush.com.cy   Fact Sheet for Healthcare Providers:   https://pope.com/    This test is not yet approved or cleared by the [place] FDA and  has been authorized for detection and/or diagnosis of SARS-CoV-2 by FDA under an Emergency Use Authorization (EUA).  This EUA will remain in effect (meaning thi s test can be used) for the duration of  the COVID-19 declaration under Section 564(b)(1) of the Act, 21 U.S.C. section 360-bbb-3(b)(1), unless the authorization is terminated or revoked sooner.  Performed at [facility], [address] [place], [place] [address]      Signed: Melina Schools Jamiesha Victoria  [facility] 09/25/2019, 4:43 PM

## 2019-09-25 NOTE — Progress Notes (Signed)
Pt discharge home without oxygen, pt O2 sat on RA 93%. Instructions and Covid teaching complete, pt acknowledged understanding, questions addressed. Spouse transported pt home. SRP, RN

## 2019-09-25 NOTE — Discharge Instructions (Signed)
COVID-19 COVID-19 is a respiratory infection that is caused by a virus called severe acute respiratory syndrome coronavirus 2 (SARS-CoV-2). The disease is also known as coronavirus disease or novel coronavirus. In some people, the virus may not cause any symptoms. In others, it may cause a serious infection. The infection can get worse quickly and can lead to complications, such as:  Pneumonia, or infection of the lungs.  Acute respiratory distress syndrome or ARDS. This is a condition in which fluid build-up in the lungs prevents the lungs from filling with air and passing oxygen into the blood.  Acute respiratory failure. This is a condition in which there is not enough oxygen passing from the lungs to the body or when carbon dioxide is not passing from the lungs out of the body.  Sepsis or septic shock. This is a serious bodily reaction to an infection.  Blood clotting problems.  Secondary infections due to bacteria or fungus.  Organ failure. This is when your body's organs stop working. The virus that causes COVID-19 is contagious. This means that it can spread from person to person through droplets from coughs and sneezes (respiratory secretions). What are the causes? This illness is caused by a virus. You may catch the virus by:  Breathing in droplets from an infected person. Droplets can be spread by a person breathing, speaking, singing, coughing, or sneezing.  Touching something, like a table or a doorknob, that was exposed to the virus (contaminated) and then touching your mouth, nose, or eyes. What increases the risk? Risk for infection You are more likely to be infected with this virus if you:  Are within 6 feet (2 meters) of a person with COVID-19.  Provide care for or live with a person who is infected with COVID-19.  Spend time in crowded indoor spaces or live in shared housing. Risk for serious illness You are more likely to become seriously ill from the virus if you:   Are 50 years of age or older. The higher your age, the more you are at risk for serious illness.  Live in a nursing home or long-term care facility.  Have cancer.  Have a long-term (chronic) disease such as: ? Chronic lung disease, including chronic obstructive pulmonary disease or asthma. ? A long-term disease that lowers your body's ability to fight infection (immunocompromised). ? Heart disease, including heart failure, a condition in which the arteries that lead to the heart become narrow or blocked (coronary artery disease), a disease which makes the heart muscle thick, weak, or stiff (cardiomyopathy). ? Diabetes. ? Chronic kidney disease. ? Sickle cell disease, a condition in which red blood cells have an abnormal "sickle" shape. ? Liver disease.  Are obese. What are the signs or symptoms? Symptoms of this condition can range from mild to severe. Symptoms may appear any time from 2 to 14 days after being exposed to the virus. They include:  A fever or chills.  A cough.  Difficulty breathing.  Headaches, body aches, or muscle aches.  Runny or stuffy (congested) nose.  A sore throat.  New loss of taste or smell. Some people may also have stomach problems, such as nausea, vomiting, or diarrhea. Other people may not have any symptoms of COVID-19. How is this diagnosed? This condition may be diagnosed based on:  Your signs and symptoms, especially if: ? You live in an area with a COVID-19 outbreak. ? You recently traveled to or from an area where the virus is common. ? You   provide care for or live with a person who was diagnosed with COVID-19. ? You were exposed to a person who was diagnosed with COVID-19.  A physical exam.  Lab tests, which may include: ? Taking a sample of fluid from the back of your nose and throat (nasopharyngeal fluid), your nose, or your throat using a swab. ? A sample of mucus from your lungs (sputum). ? Blood tests.  Imaging tests, which  may include, X-rays, CT scan, or ultrasound. How is this treated? At present, there is no medicine to treat COVID-19. Medicines that treat other diseases are being used on a trial basis to see if they are effective against COVID-19. Your health care provider will talk with you about ways to treat your symptoms. For most people, the infection is mild and can be managed at home with rest, fluids, and over-the-counter medicines. Treatment for a serious infection usually takes places in a hospital intensive care unit (ICU). It may include one or more of the following treatments. These treatments are given until your symptoms improve.  Receiving fluids and medicines through an IV.  Supplemental oxygen. Extra oxygen is given through a tube in the nose, a face mask, or a hood.  Positioning you to lie on your stomach (prone position). This makes it easier for oxygen to get into the lungs.  Continuous positive airway pressure (CPAP) or bi-level positive airway pressure (BPAP) machine. This treatment uses mild air pressure to keep the airways open. A tube that is connected to a motor delivers oxygen to the body.  Ventilator. This treatment moves air into and out of the lungs by using a tube that is placed in your windpipe.  Tracheostomy. This is a procedure to create a hole in the neck so that a breathing tube can be inserted.  Extracorporeal membrane oxygenation (ECMO). This procedure gives the lungs a chance to recover by taking over the functions of the heart and lungs. It supplies oxygen to the body and removes carbon dioxide. Follow these instructions at home: Lifestyle  If you are sick, stay home except to get medical care. Your health care provider will tell you how long to stay home. Call your health care provider before you go for medical care.  Rest at home as told by your health care provider.  Do not use any products that contain nicotine or tobacco, such as cigarettes, e-cigarettes, and  chewing tobacco. If you need help quitting, ask your health care provider.  Return to your normal activities as told by your health care provider. Ask your health care provider what activities are safe for you. General instructions  Take over-the-counter and prescription medicines only as told by your health care provider.  Drink enough fluid to keep your urine pale yellow.  Keep all follow-up visits as told by your health care provider. This is important. How is this prevented?  There is no vaccine to help prevent COVID-19 infection. However, there are steps you can take to protect yourself and others from this virus. To protect yourself:   Do not travel to areas where COVID-19 is a risk. The areas where COVID-19 is reported change often. To identify high-risk areas and travel restrictions, check the CDC travel website: wwwnc.cdc.gov/travel/notices  If you live in, or must travel to, an area where COVID-19 is a risk, take precautions to avoid infection. ? Stay away from people who are sick. ? Wash your hands often with soap and water for 20 seconds. If soap and water   are not available, use an alcohol-based hand sanitizer. ? Avoid touching your mouth, face, eyes, or nose. ? Avoid going out in public, follow guidance from your state and local health authorities. ? If you must go out in public, wear a cloth face covering or face mask. Make sure your mask covers your nose and mouth. ? Avoid crowded indoor spaces. Stay at least 6 feet (2 meters) away from others. ? Disinfect objects and surfaces that are frequently touched every day. This may include:  Counters and tables.  Doorknobs and light switches.  Sinks and faucets.  Electronics, such as phones, remote controls, keyboards, computers, and tablets. To protect others: If you have symptoms of COVID-19, take steps to prevent the virus from spreading to others.  If you think you have a COVID-19 infection, contact your health care  provider right away. Tell your health care team that you think you may have a COVID-19 infection.  Stay home. Leave your house only to seek medical care. Do not use public transport.  Do not travel while you are sick.  Wash your hands often with soap and water for 20 seconds. If soap and water are not available, use alcohol-based hand sanitizer.  Stay away from other members of your household. Let healthy household members care for children and pets, if possible. If you have to care for children or pets, wash your hands often and wear a mask. If possible, stay in your own room, separate from others. Use a different bathroom.  Make sure that all people in your household wash their hands well and often.  Cough or sneeze into a tissue or your sleeve or elbow. Do not cough or sneeze into your hand or into the air.  Wear a cloth face covering or face mask. Make sure your mask covers your nose and mouth. Where to find more information  Centers for Disease Control and Prevention: www.cdc.gov/coronavirus/2019-ncov/index.html  World Health Organization: www.who.int/health-topics/coronavirus Contact a health care provider if:  You live in or have traveled to an area where COVID-19 is a risk and you have symptoms of the infection.  You have had contact with someone who has COVID-19 and you have symptoms of the infection. Get help right away if:  You have trouble breathing.  You have pain or pressure in your chest.  You have confusion.  You have bluish lips and fingernails.  You have difficulty waking from sleep.  You have symptoms that get worse. These symptoms may represent a serious problem that is an emergency. Do not wait to see if the symptoms will go away. Get medical help right away. Call your local emergency services (911 in the [place] Do not drive yourself to the hospital. Let the emergency medical personnel know if you think you have COVID-19. Summary  COVID-19 is a  respiratory infection that is caused by a virus. It is also known as coronavirus disease or novel coronavirus. It can cause serious infections, such as pneumonia, acute respiratory distress syndrome, acute respiratory failure, or sepsis.  The virus that causes COVID-19 is contagious. This means that it can spread from person to person through droplets from breathing, speaking, singing, coughing, or sneezing.  You are more likely to develop a serious illness if you are 50 years of age or older, have a weak immune system, live in a nursing home, or have chronic disease.  There is no medicine to treat COVID-19. Your health care provider will talk with you about ways to treat your symptoms.    Take steps to protect yourself and others from infection. Wash your hands often and disinfect objects and surfaces that are frequently touched every day. Stay away from people who are sick and wear a mask if you are sick. This information is not intended to replace advice given to you by your health care provider. Make sure you discuss any questions you have with your health care provider. Document Revised: 11/15/2018 Document Reviewed: 02/21/2018 Elsevier Patient Education  2020 [employer]  

## 2019-09-27 LAB — CULTURE, BLOOD (ROUTINE X 2)
Culture: NO GROWTH
Culture: NO GROWTH
Special Requests: ADEQUATE

## 2019-11-03 ENCOUNTER — Telehealth: Payer: Self-pay | Admitting: Family Medicine

## 2019-11-03 NOTE — Telephone Encounter (Signed)
Patient is calling and requesting a call back regarding medication, please advise. CB is [phone]

## 2019-11-04 NOTE — Telephone Encounter (Signed)
Patient calling to inform doctor that she is no longer taking her BP medication, per patient she was taken off at the hospital.

## 2019-11-06 ENCOUNTER — Other Ambulatory Visit: Payer: Self-pay

## 2019-11-06 ENCOUNTER — Telehealth: Payer: Self-pay | Admitting: Family Medicine

## 2019-11-06 ENCOUNTER — Ambulatory Visit (INDEPENDENT_AMBULATORY_CARE_PROVIDER_SITE_OTHER): Payer: Medicare Other | Admitting: Family Medicine

## 2019-11-06 ENCOUNTER — Encounter: Payer: Self-pay | Admitting: Family Medicine

## 2019-11-06 VITALS — Ht 62.0 in

## 2019-11-06 DIAGNOSIS — F419 Anxiety disorder, unspecified: Secondary | ICD-10-CM | POA: Diagnosis not present

## 2019-11-06 DIAGNOSIS — E782 Mixed hyperlipidemia: Secondary | ICD-10-CM

## 2019-11-06 DIAGNOSIS — Z8616 Personal history of COVID-19: Secondary | ICD-10-CM

## 2019-11-06 DIAGNOSIS — I10 Essential (primary) hypertension: Secondary | ICD-10-CM | POA: Diagnosis not present

## 2019-11-06 MED ORDER — FLUOXETINE HCL 10 MG PO CAPS: 10.0000 mg | ORAL_CAPSULE | Freq: Every day | ORAL | 2 refills | Status: DC

## 2019-11-06 NOTE — Telephone Encounter (Addendum)
Patient is calling and requesting a refill for amlodipine, chlorthalidone and spiro- lactone sent to Karin Golden at [facility], please advise. CB is [phone]

## 2019-11-06 NOTE — Progress Notes (Addendum)
Established Patient Office Visit  Subjective:  Patient ID: Donna [name], female    DOB: February 14, 1950  Age: 69 y.o. MRN: [id number]  CC:  Chief Complaint  Patient presents with  . Follow-up    3 month follow up, patient states that she was told that she could come off blood pressure medication,     HPI Donna [name] presents for fu of blood pressure sp hospitalization for covid. Patient is only taking chlorthalidone and amlodipine.  BP is running in the 120-150/70-80s. Metoprolol was discontinued secondary to bradycardia. Had not had her covid vaccine because of her husband's cancer. She is sp hospitalization with severe hypoxia. She is doing better now. She has started covid vaccine with [employer]. Feels so anxious.  Would like something for it. Intentional weight loss.   Past Medical History:  Diagnosis Date  . Essential hypertension 05/22/2017    Past Surgical History:  Procedure Laterality Date  . CHOLECYSTECTOMY N/A 05/18/2017   Procedure: LAPAROSCOPIC CHOLECYSTECTOMY WITH INTRAOPERATIVE CHOLANGIOGRAM;  Surgeon: Griselda Miner, MD;  Location: [facility];  Service: General;  Laterality: N/A;  . ENDOSCOPIC RETROGRADE CHOLANGIOPANCREATOGRAPHY (ERCP) WITH PROPOFOL N/A 05/21/2017   Procedure: ENDOSCOPIC RETROGRADE CHOLANGIOPANCREATOGRAPHY (ERCP) WITH PROPOFOL;  Surgeon: Vida Rigger, MD;  Location: [facility];  Service: Endoscopy;  Laterality: N/A;    History reviewed. No pertinent family history.  Social History   Socioeconomic History  . Marital status: Married    Spouse name: Not on file  . Number of children: Not on file  . Years of education: Not on file  . Highest education level: Not on file  Occupational History  . Not on file  Tobacco Use  . Smoking status: Never Smoker  . Smokeless tobacco: Never Used  Substance and Sexual Activity  . Alcohol use: Never  . Drug use: Never  . Sexual activity: Yes  Other Topics Concern  . Not on file  Social History Narrative  . Not on  file   Social Determinants of Health   Financial Resource Strain:   . Difficulty of Paying Living Expenses: Not on file  Food Insecurity:   . Worried About Programme researcher, broadcasting/film/video in the Last Year: Not on file  . Ran Out of Food in the Last Year: Not on file  Transportation Needs:   . Lack of Transportation (Medical): Not on file  . Lack of Transportation (Non-Medical): Not on file  Physical Activity:   . Days of Exercise per Week: Not on file  . Minutes of Exercise per Session: Not on file  Stress:   . Feeling of Stress : Not on file  Social Connections:   . Frequency of Communication with Friends and Family: Not on file  . Frequency of Social Gatherings with Friends and Family: Not on file  . Attends Religious Services: Not on file  . Active Member of Clubs or Organizations: Not on file  . Attends Banker Meetings: Not on file  . Marital Status: Not on file  Intimate Partner Violence:   . Fear of Current or Ex-Partner: Not on file  . Emotionally Abused: Not on file  . Physically Abused: Not on file  . Sexually Abused: Not on file    Outpatient Medications Prior to Visit  Medication Sig Dispense Refill  . aspirin EC 81 MG tablet Take 81 mg by mouth every other day.    . rosuvastatin (CRESTOR) 10 MG tablet Take 1 tablet (10 mg total) by mouth daily. 90 tablet 2  .  pantoprazole (PROTONIX) 40 MG tablet Take 1 tablet (40 mg total) by mouth daily for 14 days. 14 tablet 0  . guaiFENesin-dextromethorphan (ROBITUSSIN DM) 100-10 MG/5ML syrup Take 10 mLs by mouth every 4 (four) hours as needed for cough. (Patient not taking: Reported on 11/06/2019) 118 mL 0  . predniSONE (DELTASONE) 10 MG tablet Take 6 tabs twice a day for 2 days, then take 4 tabs twice a day for 2 days, then take 4 tabs daily for 2 days, then take 3 tabs daily for 2 days, then take 2 tabs daily for 2 days, then take 1 tab daily for 2 days, then STOP. (Patient not taking: Reported on 11/06/2019) 60 tablet 0  .  spironolactone (ALDACTONE) 25 MG tablet Take 1 tablet (25 mg total) by mouth daily. (Patient not taking: Reported on 11/06/2019) 90 tablet 0  . triamcinolone ointment (KENALOG) 0.1 % Apply a thin strip to fissures on finger tips once daily as needed. (Patient not taking: Reported on 11/06/2019) 30 g 0   No facility-administered medications prior to visit.    Allergies  Allergen Reactions  . Penicillins     Unsure of the reaction- happened around age 72    ROS Review of Systems  Constitutional: Negative.   HENT: Negative.   Eyes: Negative for photophobia and visual disturbance.  Respiratory: Negative for chest tightness and shortness of breath.   Cardiovascular: Negative for chest pain and leg swelling.  Gastrointestinal: Negative.   Endocrine: Negative for polyphagia and polyuria.  Genitourinary: Negative.   Musculoskeletal: Negative for gait problem and joint swelling.  Skin: Negative for pallor and rash.  Neurological: Negative for weakness and light-headedness.  Psychiatric/Behavioral: The patient is nervous/anxious.       Objective:    Physical Exam Vitals and nursing note reviewed.  Constitutional:      General: She is not in acute distress.    Appearance: Normal appearance. She is not ill-appearing, toxic-appearing or diaphoretic.  HENT:     Head: Normocephalic and atraumatic.  Eyes:     General:        Right eye: No discharge.        Left eye: No discharge.     Conjunctiva/sclera: Conjunctivae normal.  Pulmonary:     Effort: Pulmonary effort is normal.  Neurological:     Mental Status: She is alert and oriented to person, place, and time.  Psychiatric:        Mood and Affect: Mood normal.        Behavior: Behavior normal.     Ht 5\' 2"  (1.575 m)   BMI 34.27 kg/m  Wt Readings from Last 3 Encounters:  09/21/19 187 lb 6.3 oz (85 kg)  07/31/19 200 lb 6.4 oz (90.9 kg)  07/02/19 199 lb 12.8 oz (90.6 kg)     Health Maintenance Due  Topic Date Due  .  Hepatitis C Screening  Never done  . TETANUS/TDAP  Never done  . DEXA SCAN  Never done  . MAMMOGRAM  12/08/2014  . INFLUENZA VACCINE  08/31/2019    There are no preventive care reminders to display for this patient.  No results found for: TSH Lab Results  Component Value Date   WBC 8.9 09/25/2019   HGB 12.3 09/25/2019   HCT 37.5 09/25/2019   MCV 89.9 09/25/2019   PLT 329 09/25/2019   Lab Results  Component Value Date   NA 135 09/25/2019   K 3.5 09/25/2019   CO2 29 09/25/2019   GLUCOSE  170 (H) 09/25/2019   BUN 25 (H) 09/25/2019   CREATININE 0.67 09/25/2019   BILITOT 1.1 09/25/2019   ALKPHOS 46 09/25/2019   AST 33 09/25/2019   ALT 31 09/25/2019   PROT 6.0 (L) 09/25/2019   ALBUMIN 2.9 (L) 09/25/2019   CALCIUM 8.1 (L) 09/25/2019   ANIONGAP 9 09/25/2019   GFR 83.94 05/20/2019   Lab Results  Component Value Date   CHOL 178 05/20/2019   Lab Results  Component Value Date   HDL 56.90 05/20/2019   Lab Results  Component Value Date   LDLCALC 91 05/20/2019   Lab Results  Component Value Date   TRIG 144 09/21/2019   Lab Results  Component Value Date   CHOLHDL 3 05/20/2019   Lab Results  Component Value Date   HGBA1C 5.7 05/20/2019      Assessment & Plan:   Problem List Items Addressed This Visit      Cardiovascular and Mediastinum   Essential hypertension - Primary (Chronic)   Relevant Medications   amLODipine (NORVASC) 10 MG tablet   spironolactone (ALDACTONE) 25 MG tablet   chlorthalidone (HYGROTON) 25 MG tablet     Other   Anxiety   Relevant Medications   FLUoxetine (PROZAC) 10 MG capsule   History of COVID-19      Meds ordered this encounter  Medications  . FLUoxetine (PROZAC) 10 MG capsule    Sig: Take 1 capsule (10 mg total) by mouth daily.    Dispense:  30 capsule    Refill:  2  . amLODipine (NORVASC) 10 MG tablet    Sig: Take 1 tablet (10 mg total) by mouth daily.    Dispense:  100 tablet    Refill:  1  . spironolactone (ALDACTONE)  25 MG tablet    Sig: Take 1 tablet (25 mg total) by mouth daily.    Dispense:  100 tablet    Refill:  1  . chlorthalidone (HYGROTON) 25 MG tablet    Sig: Take 1 tablet (25 mg total) by mouth daily.    Dispense:  100 tablet    Refill:  1    Follow-up: Return in about 1 month (around 12/07/2019).   Continue amlodipine, chlorthalidone,  And start Prozac. Advised that it will take a few weeks to work.  Mliss Sax, MD   Virtual Visit via Video Note  I connected with Donna [name] on 11/07/19 at  3:00 PM EDT by a video enabled telemedicine application and verified that I am speaking with the correct person using two identifiers.  Location: Patient: at home with grandson.  Provider:    I discussed the limitations of evaluation and management by telemedicine and the availability of in person appointments. The patient expressed understanding and agreed to proceed.  History of Present Illness:    Observations/Objective:   Assessment and Plan:   Follow Up Instructions:    I discussed the assessment and treatment plan with the patient. The patient was provided an opportunity to ask questions and all were answered. The patient agreed with the plan and demonstrated an understanding of the instructions.   The patient was advised to call back or seek an in-person evaluation if the symptoms worsen or if the condition fails to improve as anticipated.  I provided 25 minutes of non-face-to-face time during this encounter.   Mliss Sax, MD

## 2019-11-06 NOTE — Telephone Encounter (Signed)
Please advise message below. Patient informed me that she was no longer taking requested medications. Is it okay refill?

## 2019-11-07 MED ORDER — SPIRONOLACTONE 25 MG PO TABS: 25.0000 mg | ORAL_TABLET | Freq: Every day | ORAL | 1 refills | Status: DC

## 2019-11-07 MED ORDER — CHLORTHALIDONE 25 MG PO TABS: 25.0000 mg | ORAL_TABLET | Freq: Every day | ORAL | 1 refills | Status: DC

## 2019-11-07 MED ORDER — ROSUVASTATIN CALCIUM 10 MG PO TABS: 10.0000 mg | ORAL_TABLET | Freq: Every day | ORAL | 2 refills | Status: DC

## 2019-11-07 MED ORDER — AMLODIPINE BESYLATE 10 MG PO TABS: 10.0000 mg | ORAL_TABLET | Freq: Every day | ORAL | 1 refills | Status: DC

## 2019-11-07 NOTE — Addendum Note (Signed)
Addended by: Andrez Grime on: 11/07/2019 08:02 AM   Modules accepted: Orders

## 2019-12-02 ENCOUNTER — Telehealth (INDEPENDENT_AMBULATORY_CARE_PROVIDER_SITE_OTHER): Payer: Medicare Other | Admitting: Family Medicine

## 2019-12-02 ENCOUNTER — Encounter: Payer: Self-pay | Admitting: Family Medicine

## 2019-12-02 VITALS — Ht 62.0 in

## 2019-12-02 DIAGNOSIS — I10 Essential (primary) hypertension: Secondary | ICD-10-CM | POA: Diagnosis not present

## 2019-12-02 DIAGNOSIS — Z8616 Personal history of COVID-19: Secondary | ICD-10-CM | POA: Diagnosis not present

## 2019-12-02 DIAGNOSIS — E782 Mixed hyperlipidemia: Secondary | ICD-10-CM | POA: Diagnosis not present

## 2019-12-02 DIAGNOSIS — E6609 Other obesity due to excess calories: Secondary | ICD-10-CM

## 2019-12-02 DIAGNOSIS — F419 Anxiety disorder, unspecified: Secondary | ICD-10-CM

## 2019-12-02 DIAGNOSIS — Z6838 Body mass index (BMI) 38.0-38.9, adult: Secondary | ICD-10-CM

## 2019-12-02 NOTE — Progress Notes (Signed)
Established Patient Office Visit  Subjective:  Patient ID: Donna [name], female    DOB: [birth year]  Age: 69 y.o. MRN: [id number]  CC:  Chief Complaint  Patient presents with  . Follow-up    1 month follow up     HPI Donna [name] presents for follow-up of her hypertension, anxiety, obesity and recent Covid infection.  She has completed her Covid vaccine series through [employer] and has had her flu shot.  She has been able to lose 9 pounds.  She continues with amlodipine chlorthalidone and Aldactone for her blood pressure that remains well controlled in the 120/80 range.  Fluoxetine is definitely helped to calm her.  She does not remember to take it daily but feels as though the medicine is helping a good deal.  Past Medical History:  Diagnosis Date  . Essential hypertension 05/22/2017    Past Surgical History:  Procedure Laterality Date  . CHOLECYSTECTOMY N/A 05/18/2017   Procedure: LAPAROSCOPIC CHOLECYSTECTOMY WITH INTRAOPERATIVE CHOLANGIOGRAM;  Surgeon: Griselda Miner, MD;  Location: [facility];  Service: General;  Laterality: N/A;  . ENDOSCOPIC RETROGRADE CHOLANGIOPANCREATOGRAPHY (ERCP) WITH PROPOFOL N/A 05/21/2017   Procedure: ENDOSCOPIC RETROGRADE CHOLANGIOPANCREATOGRAPHY (ERCP) WITH PROPOFOL;  Surgeon: Vida Rigger, MD;  Location: [facility];  Service: Endoscopy;  Laterality: N/A;    No family history on file.  Social History   Socioeconomic History  . Marital status: Married    Spouse name: Not on file  . Number of children: Not on file  . Years of education: Not on file  . Highest education level: Not on file  Occupational History  . Not on file  Tobacco Use  . Smoking status: Never Smoker  . Smokeless tobacco: Never Used  Substance and Sexual Activity  . Alcohol use: Never  . Drug use: Never  . Sexual activity: Yes  Other Topics Concern  . Not on file  Social History Narrative  . Not on file   Social Determinants of Health   Financial Resource Strain:   .  Difficulty of Paying Living Expenses: Not on file  Food Insecurity:   . Worried About Programme researcher, broadcasting/film/video in the Last Year: Not on file  . Ran Out of Food in the Last Year: Not on file  Transportation Needs:   . Lack of Transportation (Medical): Not on file  . Lack of Transportation (Non-Medical): Not on file  Physical Activity:   . Days of Exercise per Week: Not on file  . Minutes of Exercise per Session: Not on file  Stress:   . Feeling of Stress : Not on file  Social Connections:   . Frequency of Communication with Friends and Family: Not on file  . Frequency of Social Gatherings with Friends and Family: Not on file  . Attends Religious Services: Not on file  . Active Member of Clubs or Organizations: Not on file  . Attends Banker Meetings: Not on file  . Marital Status: Not on file  Intimate Partner Violence:   . Fear of Current or Ex-Partner: Not on file  . Emotionally Abused: Not on file  . Physically Abused: Not on file  . Sexually Abused: Not on file    Outpatient Medications Prior to Visit  Medication Sig Dispense Refill  . amLODipine (NORVASC) 10 MG tablet Take 1 tablet (10 mg total) by mouth daily. 100 tablet 1  . aspirin EC 81 MG tablet Take 81 mg by mouth every other day.    . chlorthalidone (HYGROTON)  25 MG tablet Take 1 tablet (25 mg total) by mouth daily. 100 tablet 1  . rosuvastatin (CRESTOR) 10 MG tablet Take 1 tablet (10 mg total) by mouth daily. 90 tablet 2  . spironolactone (ALDACTONE) 25 MG tablet Take 1 tablet (25 mg total) by mouth daily. 100 tablet 1  . FLUoxetine (PROZAC) 10 MG capsule Take 1 capsule (10 mg total) by mouth daily. (Patient not taking: Reported on 12/02/2019) 30 capsule 2  . pantoprazole (PROTONIX) 40 MG tablet Take 1 tablet (40 mg total) by mouth daily for 14 days. 14 tablet 0   No facility-administered medications prior to visit.    Allergies  Allergen Reactions  . Penicillins     Unsure of the reaction- happened around  age 81    ROS Review of Systems  Constitutional: Negative.   HENT: Negative.   Eyes: Negative for photophobia.  Respiratory: Negative.   Cardiovascular: Negative.   Gastrointestinal: Negative.   Endocrine: Negative for polyphagia and polyuria.  Genitourinary: Negative.   Musculoskeletal: Negative for gait problem and joint swelling.  Neurological: Negative for light-headedness and headaches.  Hematological: Does not bruise/bleed easily.  Psychiatric/Behavioral: Negative for dysphoric mood. The patient is not nervous/anxious.       Objective:    Physical Exam Vitals and nursing note reviewed.  Constitutional:      General: She is not in acute distress. Pulmonary:     Effort: Pulmonary effort is normal.  Neurological:     Mental Status: She is oriented to person, place, and time.  Psychiatric:        Mood and Affect: Mood normal.        Behavior: Behavior normal.     Ht 5\' 2"  (1.575 m)   SpO2 97%   BMI 34.27 kg/m  Wt Readings from Last 3 Encounters:  09/21/19 187 lb 6.3 oz (85 kg)  07/31/19 200 lb 6.4 oz (90.9 kg)  07/02/19 199 lb 12.8 oz (90.6 kg)     Health Maintenance Due  Topic Date Due  . Hepatitis C Screening  Never done  . TETANUS/TDAP  Never done  . DEXA SCAN  Never done  . MAMMOGRAM  12/08/2014  . INFLUENZA VACCINE  08/31/2019  . COVID-19 Vaccine (2 - Pfizer 2-dose series) 11/23/2019    There are no preventive care reminders to display for this patient.  No results found for: TSH Lab Results  Component Value Date   WBC 8.9 09/25/2019   HGB 12.3 09/25/2019   HCT 37.5 09/25/2019   MCV 89.9 09/25/2019   PLT 329 09/25/2019   Lab Results  Component Value Date   NA 135 09/25/2019   K 3.5 09/25/2019   CO2 29 09/25/2019   GLUCOSE 170 (H) 09/25/2019   BUN 25 (H) 09/25/2019   CREATININE 0.67 09/25/2019   BILITOT 1.1 09/25/2019   ALKPHOS 46 09/25/2019   AST 33 09/25/2019   ALT 31 09/25/2019   PROT 6.0 (L) 09/25/2019   ALBUMIN 2.9 (L)  09/25/2019   CALCIUM 8.1 (L) 09/25/2019   ANIONGAP 9 09/25/2019   GFR 83.94 05/20/2019   Lab Results  Component Value Date   CHOL 178 05/20/2019   Lab Results  Component Value Date   HDL 56.90 05/20/2019   Lab Results  Component Value Date   LDLCALC 91 05/20/2019   Lab Results  Component Value Date   TRIG 144 09/21/2019   Lab Results  Component Value Date   CHOLHDL 3 05/20/2019   Lab Results  Component Value Date   HGBA1C 5.7 05/20/2019      Assessment & Plan:   Problem List Items Addressed This Visit      Cardiovascular and Mediastinum   Essential hypertension (Chronic)     Other   Mixed hyperlipidemia - Primary   Class 2 obesity due to excess calories with body mass index (BMI) of 38.0 to 38.9 in adult   Anxiety   History of COVID-19      No orders of the defined types were placed in this encounter.   Follow-up: No follow-ups on file.   Encouraged her to continue with her weight loss efforts and to take the Prozac daily.  Continue with current blood pressure medicines.  Advised her to have a Covid booster in 6 months.  Follow-up in 3 months for recheck.  Virtual Visit via Telephone Note  I connected with Donna [name] on 12/02/19 at  3:00 PM EDT by telephone and verified that I am speaking with the correct person using two identifiers.  Location: Patient: home with her husband. Provider: home   I discussed the limitations, risks, security and privacy concerns of performing an evaluation and management service by telephone and the availability of in person appointments. I also discussed with the patient that there may be a patient responsible charge related to this service. The patient expressed understanding and agreed to proceed.   History of Present Illness:    Observations/Objective:   Assessment and Plan:   Follow Up Instructions:    I discussed the assessment and treatment plan with the patient. The patient was provided an opportunity  to ask questions and all were answered. The patient agreed with the plan and demonstrated an understanding of the instructions.   The patient was advised to call back or seek an in-person evaluation if the symptoms worsen or if the condition fails to improve as anticipated.  I provided 25 minutes of non-face-to-face time during this encounter.   Mliss Sax, MD  Mliss Sax, MD   Interactive video and audio telecommunications were attempted between myself and the patient. However they failed due to the patient having technical difficulties or not having access to video capability. We continued and completed with audio only.

## 2019-12-16 IMAGING — RF DG CHOLANGIOGRAM OPERATIVE
1 series · 8 of 8 positions shown · non-contrast
Comparison: Right upper quadrant abdominal ultrasound - 05/18/2017

CLINICAL DATA: Intraoperative cholangiogram during laparoscopic
cholecystectomy.

EXAM:
INTRAOPERATIVE CHOLANGIOGRAM
FLUOROSCOPY TIME:  47 seconds

[Series 1: run · 2 acquisitions, 8 frames shown]
[im 1/2]
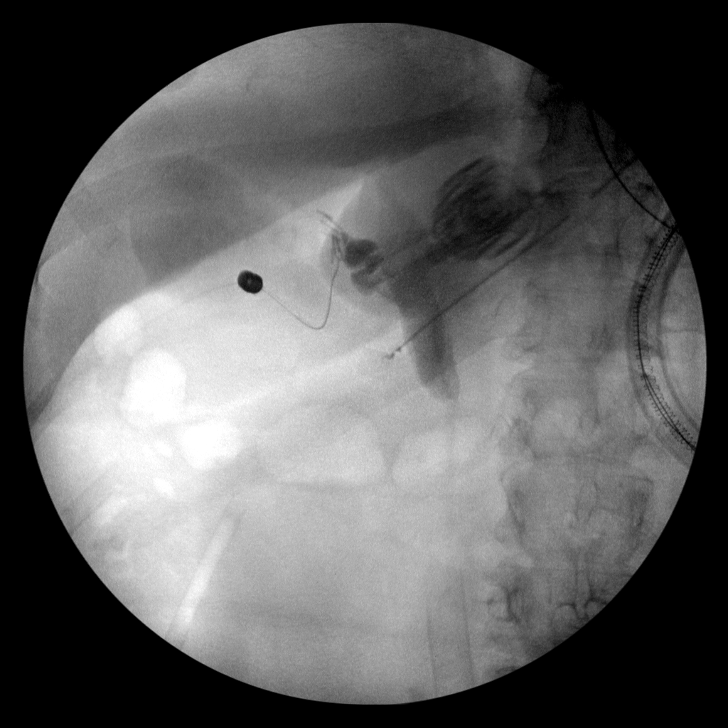
[im 1/2]
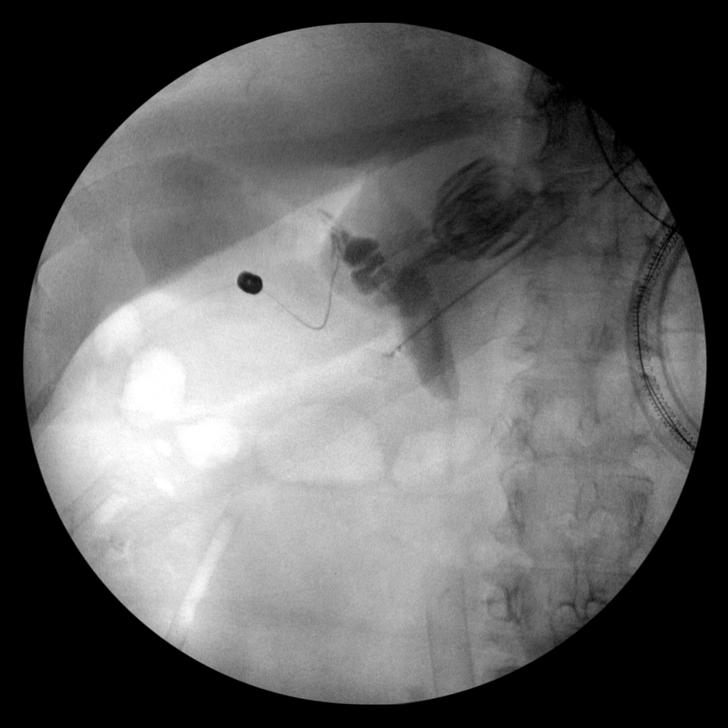
[im 1/2]
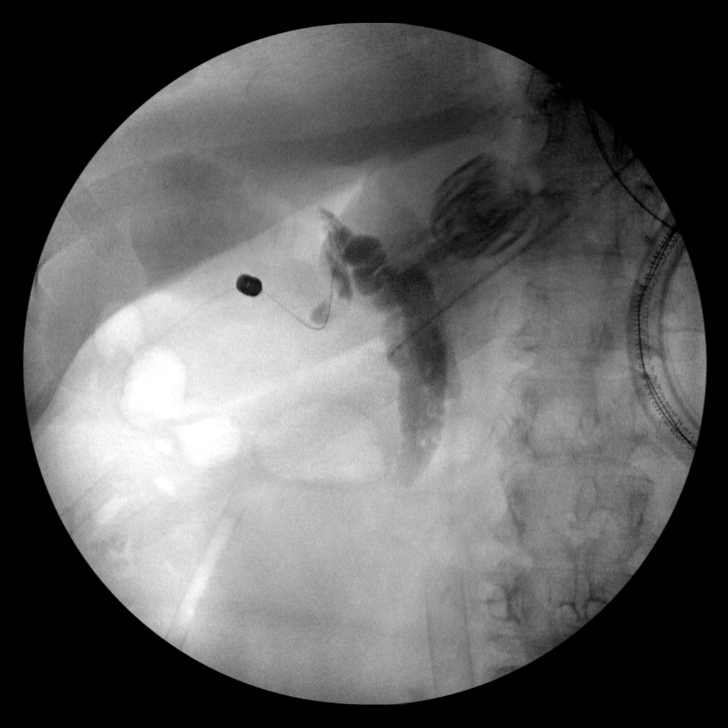
[im 1/2]
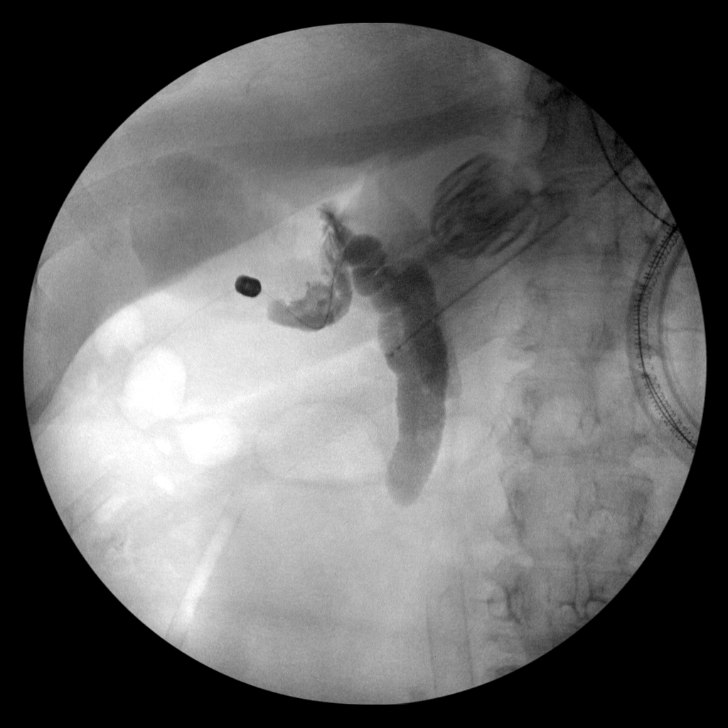
[im 2/2]
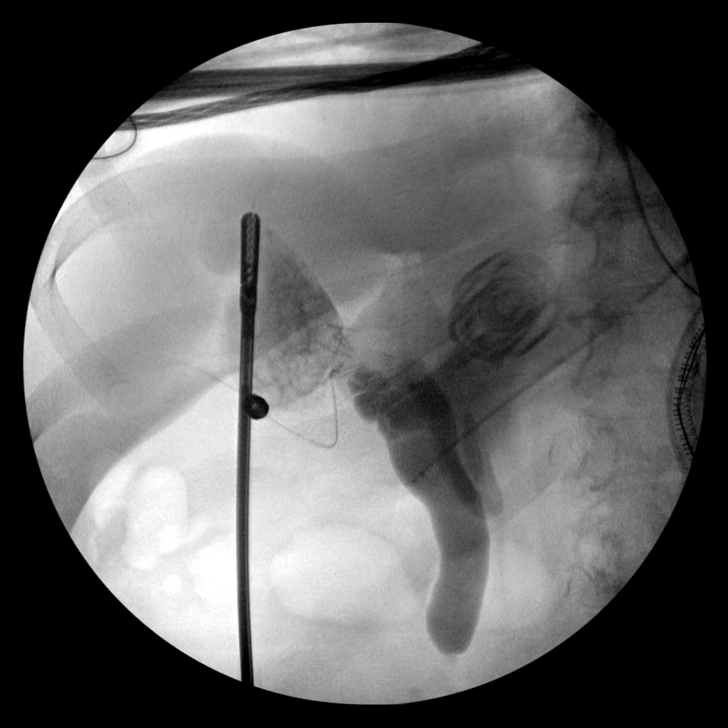
[im 2/2]
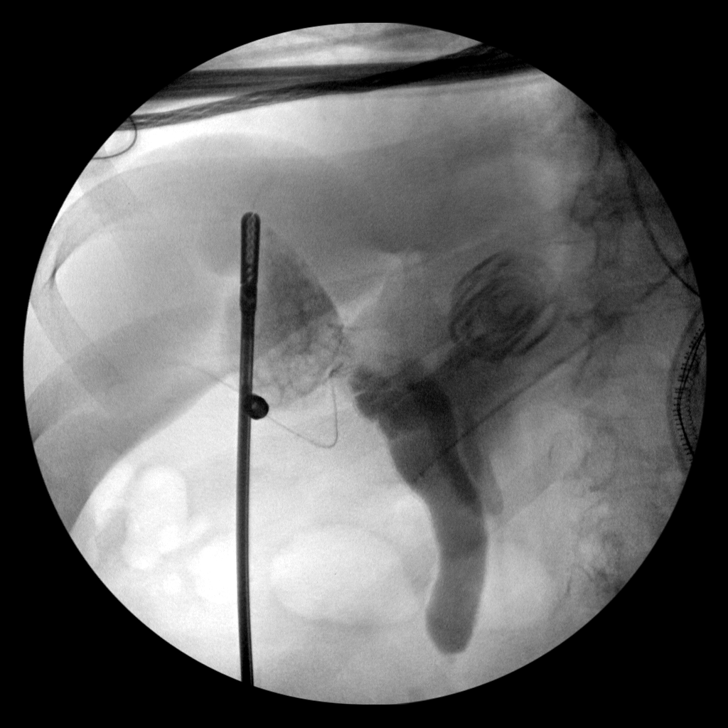
[im 2/2]
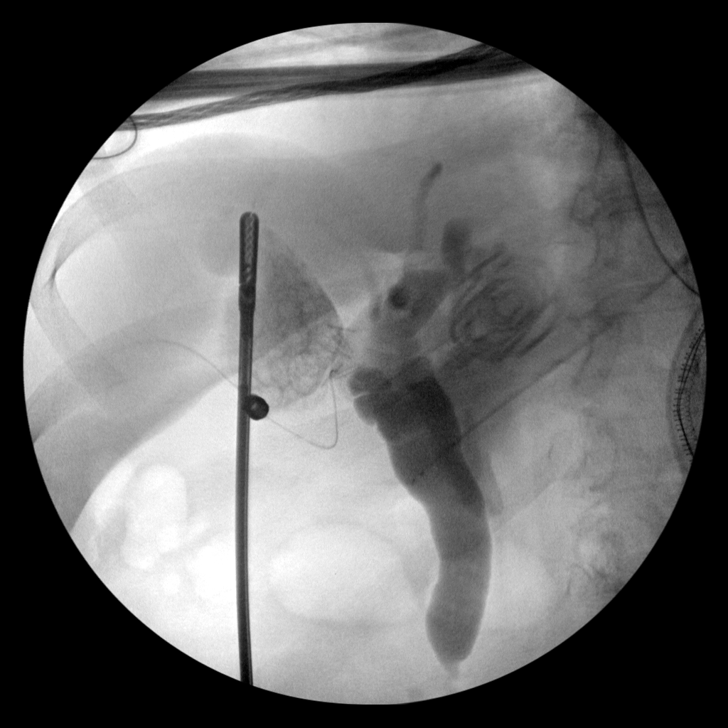
[im 2/2]
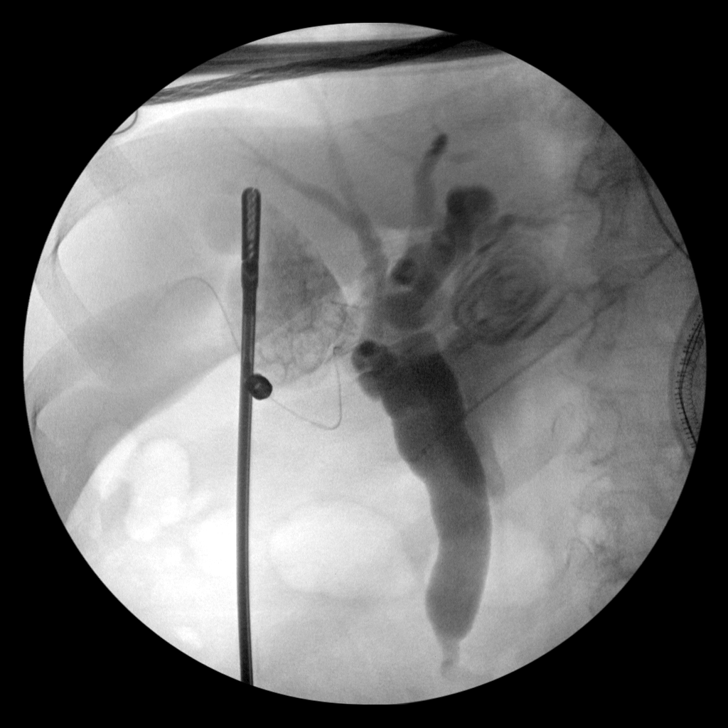

[8 of 8 positions shown; findings below may reference images not displayed]

FINDINGS: Intraoperative cholangiographic images of the right upper abdominal
quadrant during laparoscopic cholecystectomy are provided for
review.

Surgical clips overlie the expected location of the gallbladder
fossa.

There are several ill-defined filling defects within the opacified
portion of the neck of the gallbladder compatible with known
cholelithiasis.

Contrast injection demonstrates selective cannulation of the central
aspect of the cystic duct.

There is passage of contrast through the central aspect of the
cystic duct with filling of a dilated common bile duct. There is no
definitive passage of contrast to the level of the duodenum.

There is minimal reflux of injected contrast into the common hepatic
duct and central aspect of the mildly dilated intrahepatic biliary
system.

While there are no discrete filling defects within the opacified
portions of the biliary system to suggest the presence of
choledocholithiasis, however again, there is no definitive
opacification of the duodenum.
IMPRESSION: Cholelithiasis without evidence of choledocholithiasis, however
there is no definitive passage of contrast into the duodenum.
Correlation with the operative report is recommended. Further
evaluation with ERCP could be performed as indicated.

## 2020-01-12 NOTE — Progress Notes (Deleted)
Subjective:   Donna [name] is a 69 y.o. female who presents for an Initial Medicare Annual Wellness Visit.  I connected with Donna [name] today by telephone and verified that I am speaking with the correct person using two identifiers. Location patient: home Location provider: work Persons participating in the virtual visit: patient, Engineer, civil (consulting).    I discussed the limitations, risks, security and privacy concerns of performing an evaluation and management service by telephone and the availability of in person appointments. I also discussed with the patient that there may be a patient responsible charge related to this service. The patient expressed understanding and verbally consented to this telephonic visit.    Interactive audio and video telecommunications were attempted between this provider and patient, however failed, due to patient having technical difficulties OR patient did not have access to video capability.  We continued and completed visit with audio only.  Some vital signs may be absent or patient reported.   Time Spent with patient on telephone encounter: *** minutes   Review of Systems    ***       Objective:    There were no vitals filed for this visit. There is no height or weight on file to calculate BMI.  Advanced Directives 09/21/2019 05/21/2017 05/18/2017  Does Patient Have a Medical Advance Directive? No No No  Would patient like information on creating a medical advance directive? No - Patient declined No - Patient declined -    Current Medications (verified) Outpatient Encounter Medications as of 01/13/2020  Medication Sig  . amLODipine (NORVASC) 10 MG tablet Take 1 tablet (10 mg total) by mouth daily.  Marland Kitchen aspirin EC 81 MG tablet Take 81 mg by mouth every other day.  . chlorthalidone (HYGROTON) 25 MG tablet Take 1 tablet (25 mg total) by mouth daily.  Marland Kitchen FLUoxetine (PROZAC) 10 MG capsule Take 1 capsule (10 mg total) by mouth daily. (Patient not taking: Reported on  12/02/2019)  . pantoprazole (PROTONIX) 40 MG tablet Take 1 tablet (40 mg total) by mouth daily for 14 days.  . rosuvastatin (CRESTOR) 10 MG tablet Take 1 tablet (10 mg total) by mouth daily.  Marland Kitchen spironolactone (ALDACTONE) 25 MG tablet Take 1 tablet (25 mg total) by mouth daily.   No facility-administered encounter medications on file as of 01/13/2020.    Allergies (verified) Penicillins   History: Past Medical History:  Diagnosis Date  . Essential hypertension 05/22/2017   Past Surgical History:  Procedure Laterality Date  . CHOLECYSTECTOMY N/A 05/18/2017   Procedure: LAPAROSCOPIC CHOLECYSTECTOMY WITH INTRAOPERATIVE CHOLANGIOGRAM;  Surgeon: Griselda Miner, MD;  Location: [facility];  Service: General;  Laterality: N/A;  . ENDOSCOPIC RETROGRADE CHOLANGIOPANCREATOGRAPHY (ERCP) WITH PROPOFOL N/A 05/21/2017   Procedure: ENDOSCOPIC RETROGRADE CHOLANGIOPANCREATOGRAPHY (ERCP) WITH PROPOFOL;  Surgeon: Vida Rigger, MD;  Location: [facility];  Service: Endoscopy;  Laterality: N/A;   No family history on file. Social History   Socioeconomic History  . Marital status: Married    Spouse name: Not on file  . Number of children: Not on file  . Years of education: Not on file  . Highest education level: Not on file  Occupational History  . Not on file  Tobacco Use  . Smoking status: Never Smoker  . Smokeless tobacco: Never Used  Substance and Sexual Activity  . Alcohol use: Never  . Drug use: Never  . Sexual activity: Yes  Other Topics Concern  . Not on file  Social History Narrative  . Not on file  Social Determinants of Health   Financial Resource Strain: Not on file  Food Insecurity: Not on file  Transportation Needs: Not on file  Physical Activity: Not on file  Stress: Not on file  Social Connections: Not on file    Tobacco Counseling Counseling given: Not Answered   Clinical Intake:                 Diabetic?***         Activities of Daily Living In your  present state of health, do you have any difficulty performing the following activities: 09/22/2019  Hearing? N  Vision? N  Difficulty concentrating or making decisions? N  Walking or climbing stairs? Y  Comment secondary to weakness and shortness of breath  Dressing or bathing? Y  Doing errands, shopping? N  Some recent data might be hidden    Patient Care Team: Mliss Sax, MD as PCP - General (Family Medicine)  Indicate any recent Medical Services you may have received from other than [facility] providers in the past year (date may be approximate).     Assessment:   This is a routine wellness examination for Donna [name].  Hearing/Vision screen No exam data present  Dietary issues and exercise activities discussed:    Goals   None    Depression Screen PHQ 2/9 Scores 12/02/2019 05/20/2019  PHQ - 2 Score 2 1  PHQ- 9 Score 3 2    Fall Risk Fall Risk  12/02/2019 05/20/2019  Falls in the past year? 0 0    FALL RISK PREVENTION PERTAINING TO THE HOME:  Any stairs in or around the home? {YES/NO:21197} If so, are there any without handrails? {YES/NO:21197} Home free of loose throw rugs in walkways, pet beds, electrical cords, etc? {YES/NO:21197} Adequate lighting in your home to reduce risk of falls? {YES/NO:21197}  ASSISTIVE DEVICES UTILIZED TO PREVENT FALLS:  Life alert? {YES/NO:21197} Use of a cane, walker or w/c? {YES/NO:21197} Grab bars in the bathroom? {YES/NO:21197} Shower chair or bench in shower? {YES/NO:21197} Elevated toilet seat or a handicapped toilet? {YES/NO:21197}  TIMED UP AND GO:  Was the test performed? {YES/NO:21197}.  Length of time to ambulate 10 feet: *** sec.   {Appearance of TLXB:2620355}  Cognitive Function:        Immunizations Immunization History  Administered Date(s) Administered  . Influenza, Quadrivalent, Recombinant, Inj, Pf 11/10/2017  . PFIZER SARS-COV-2 Vaccination 11/02/2019  . Pneumococcal Conjugate-13 07/22/2014  .  Pneumococcal Polysaccharide-23 01/12/2016    TDAP status: Due, Education has been provided regarding the importance of this vaccine. Advised may receive this vaccine at local pharmacy or Health Dept. Aware to provide a copy of the vaccination record if obtained from local pharmacy or Health Dept. Verbalized acceptance and understanding.  {Flu Vaccine status:2101806}  Pneumococcal vaccine status: Up to date  {Covid-19 vaccine status:2101808}  Qualifies for Shingles Vaccine? Yes   Zostavax completed No   Shingrix Completed?: No.    Education has been provided regarding the importance of this vaccine. Patient has been advised to call insurance company to determine out of pocket expense if they have not yet received this vaccine. Advised may also receive vaccine at local pharmacy or Health Dept. Verbalized acceptance and understanding.  Screening Tests Health Maintenance  Topic Date Due  . Hepatitis C Screening  Never done  . TETANUS/TDAP  Never done  . DEXA SCAN  Never done  . MAMMOGRAM  12/08/2014  . INFLUENZA VACCINE  08/31/2019  . COVID-19 Vaccine (2 - Pfizer 3-dose booster  series) 11/23/2019  . COLONOSCOPY  07/22/2022  . PNA vac Low Risk Adult  Completed    Health Maintenance  Health Maintenance Due  Topic Date Due  . Hepatitis C Screening  Never done  . TETANUS/TDAP  Never done  . DEXA SCAN  Never done  . MAMMOGRAM  12/08/2014  . INFLUENZA VACCINE  08/31/2019  . COVID-19 Vaccine (2 - Pfizer 3-dose booster series) 11/23/2019    Colorectal cancer screening: Type of screening: Colonoscopy. Completed 07/21/2012. Repeat every 10 years  {Mammogram status:21018020}  {Bone Density status:21018021}  Lung Cancer Screening: (Low Dose CT Chest recommended if Age 78-80 years, 30 pack-year currently smoking OR have quit w/in 15years.) does not qualify.    Additional Screening:  Hepatitis C Screening: does qualify; Discuss with PCP  Vision Screening: Recommended annual  ophthalmology exams for early detection of glaucoma and other disorders of the eye. Is the patient up to date with their annual eye exam?  {YES/NO:21197} Who is the provider or what is the name of the office in which the patient attends annual eye exams? *** If pt is not established with a provider, would they like to be referred to a provider to establish care? {YES/NO:21197}.   Dental Screening: Recommended annual dental exams for proper oral hygiene  Community Resource Referral / Chronic Care Management: CRR required this visit?  {YES/NO:21197}  CCM required this visit?  {YES/NO:21197}     Plan:     I have personally reviewed and noted the following in the patient's chart:   . Medical and social history . Use of alcohol, tobacco or illicit drugs  . Current medications and supplements . Functional ability and status . Nutritional status . Physical activity . Advanced directives . List of other physicians . Hospitalizations, surgeries, and ER visits in previous 12 months . Vitals . Screenings to include cognitive, depression, and falls . Referrals and appointments  In addition, I have reviewed and discussed with patient certain preventive protocols, quality metrics, and best practice recommendations. A written personalized care plan for preventive services as well as general preventive health recommendations were provided to patient.   Due to this being a telephonic visit, the after visit summary with patients personalized plan was offered to patient via mail or my-chart. ***Patient declined at this time./ Patient would like to access on my-chart/ per request, patient was mailed a copy of AVS./ Patient preferred to pick up at office at next visit.   Roanna Raider, LPN   [id number]  Nurse Health Advisor  Nurse Notes: ***

## 2020-01-13 ENCOUNTER — Ambulatory Visit: Payer: Medicare Other

## 2020-01-27 ENCOUNTER — Ambulatory Visit: Payer: Medicare Other

## 2020-01-29 ENCOUNTER — Other Ambulatory Visit: Payer: Self-pay | Admitting: Family Medicine

## 2020-01-29 DIAGNOSIS — F419 Anxiety disorder, unspecified: Secondary | ICD-10-CM

## 2020-02-17 ENCOUNTER — Ambulatory Visit (INDEPENDENT_AMBULATORY_CARE_PROVIDER_SITE_OTHER): Payer: Medicare Other

## 2020-02-17 VITALS — Ht 62.0 in | Wt 177.0 lb

## 2020-02-17 DIAGNOSIS — Z0000 Encounter for general adult medical examination without abnormal findings: Secondary | ICD-10-CM | POA: Diagnosis not present

## 2020-02-17 NOTE — Progress Notes (Signed)
Subjective:   Donna [name] is a 70 y.o. female who presents for an Initial Medicare Annual Wellness Visit.  I connected with [name] today by telephone and verified that I am speaking with the correct person using two identifiers. Location patient: home Location provider: work Persons participating in the virtual visit: patient, Engineer, civil (consulting)nurse.    I discussed the limitations, risks, security and privacy concerns of performing an evaluation and management service by telephone and the availability of in person appointments. I also discussed with the patient that there may be a patient responsible charge related to this service. The patient expressed understanding and verbally consented to this telephonic visit.    Interactive audio and video telecommunications were attempted between this provider and patient, however failed, due to patient having technical difficulties OR patient did not have access to video capability.  We continued and completed visit with audio only.  Some vital signs may be absent or patient reported.   Time Spent with patient on telephone encounter: 25 minutes   Review of Systems     Cardiac Risk Factors include: advanced age (>4155men, 71>65 women);hypertension;dyslipidemia;sedentary lifestyle;obesity (BMI >30kg/m2)     Objective:    Today's Vitals   02/17/20 1331  Weight: 177 lb (80.3 kg)  Height: 5\' 2"  (1.575 m)   Body mass index is 32.37 kg/m.  Advanced Directives 02/17/2020 09/21/2019 05/21/2017 05/18/2017  Does Patient Have a Medical Advance Directive? No No No No  Would patient like information on creating a medical advance directive? Yes (MAU/Ambulatory/Procedural Areas - Information given) No - Patient declined No - Patient declined -    Current Medications (verified) Outpatient Encounter Medications as of 02/17/2020  Medication Sig  . amLODipine (NORVASC) 10 MG tablet Take 1 tablet (10 mg total) by mouth daily.  Marland Kitchen. aspirin EC 81 MG tablet Take 81 mg by mouth every  other day.  . chlorthalidone (HYGROTON) 25 MG tablet Take 1 tablet (25 mg total) by mouth daily.  . rosuvastatin (CRESTOR) 10 MG tablet Take 1 tablet (10 mg total) by mouth daily.  Marland Kitchen. spironolactone (ALDACTONE) 25 MG tablet Take 1 tablet (25 mg total) by mouth daily.  Marland Kitchen. FLUoxetine (PROZAC) 10 MG capsule TAKE ONE CAPSULE BY MOUTH DAILY (Patient not taking: Reported on 02/17/2020)  . pantoprazole (PROTONIX) 40 MG tablet Take 1 tablet (40 mg total) by mouth daily for 14 days.   No facility-administered encounter medications on file as of 02/17/2020.    Allergies (verified) Penicillins   History: Past Medical History:  Diagnosis Date  . Essential hypertension 05/22/2017   Past Surgical History:  Procedure Laterality Date  . CHOLECYSTECTOMY N/A 05/18/2017   Procedure: LAPAROSCOPIC CHOLECYSTECTOMY WITH INTRAOPERATIVE CHOLANGIOGRAM;  Surgeon: Griselda Mineroth, Paul III, MD;  Location: [facility];  Service: General;  Laterality: N/A;  . ENDOSCOPIC RETROGRADE CHOLANGIOPANCREATOGRAPHY (ERCP) WITH PROPOFOL N/A 05/21/2017   Procedure: ENDOSCOPIC RETROGRADE CHOLANGIOPANCREATOGRAPHY (ERCP) WITH PROPOFOL;  Surgeon: Vida RiggerMagod, Marc, MD;  Location: [facility];  Service: Endoscopy;  Laterality: N/A;   History reviewed. No pertinent family history. Social History   Socioeconomic History  . Marital status: Married    Spouse name: Not on file  . Number of children: Not on file  . Years of education: Not on file  . Highest education level: Not on file  Occupational History  . Not on file  Tobacco Use  . Smoking status: Never Smoker  . Smokeless tobacco: Never Used  Substance and Sexual Activity  . Alcohol use: Never  . Drug use: Never  .  Sexual activity: Yes  Other Topics Concern  . Not on file  Social History Narrative  . Not on file   Social Determinants of Health   Financial Resource Strain: Low Risk   . Difficulty of Paying Living Expenses: Not hard at all  Food Insecurity: No Food Insecurity  . Worried  About Programme researcher, broadcasting/film/video in the Last Year: Never true  . Ran Out of Food in the Last Year: Never true  Transportation Needs: No Transportation Needs  . Lack of Transportation (Medical): No  . Lack of Transportation (Non-Medical): No  Physical Activity: Inactive  . Days of Exercise per Week: 0 days  . Minutes of Exercise per Session: 0 min  Stress: No Stress Concern Present  . Feeling of Stress : Only a little  Social Connections: Moderately Isolated  . Frequency of Communication with Friends and Family: More than three times a week  . Frequency of Social Gatherings with Friends and Family: More than three times a week  . Attends Religious Services: Never  . Active Member of Clubs or Organizations: No  . Attends Banker Meetings: Never  . Marital Status: Married    Tobacco Counseling Counseling given: Not Answered   Clinical Intake:  Pre-visit preparation completed: Yes  Pain : No/denies pain     Nutritional Status: BMI > 30  Obese Nutritional Risks: None Diabetes: No  How often do you need to have someone help you when you read instructions, pamphlets, or other written materials from your doctor or pharmacy?: 1 - Never  Diabetic?No  Interpreter Needed?: No  Information entered by :: Thomasenia Sales LPN   Activities of Daily Living In your present state of health, do you have any difficulty performing the following activities: 02/17/2020 09/22/2019  Hearing? N N  Vision? N N  Difficulty concentrating or making decisions? N N  Walking or climbing stairs? N Y  Comment - secondary to weakness and shortness of breath  Dressing or bathing? N Y  Doing errands, shopping? N N  Preparing Food and eating ? N -  Using the Toilet? N -  In the past six months, have you accidently leaked urine? N -  Do you have problems with loss of bowel control? N -  Managing your Medications? N -  Managing your Finances? N -  Housekeeping or managing your Housekeeping? N -   Some recent data might be hidden    Patient Care Team: Mliss Sax, MD as PCP - General (Family Medicine)  Indicate any recent Medical Services you may have received from other than [facility] providers in the past year (date may be approximate).     Assessment:   This is a routine wellness examination for [place].  Hearing/Vision screen  Hearing Screening   125Hz  250Hz  500Hz  1000Hz  2000Hz  3000Hz  4000Hz  6000Hz  8000Hz   Right ear:           Left ear:           Comments: No issues  Vision Screening Comments: Wears glasses Last eye exam-2 yrs ago   Dietary issues and exercise activities discussed: Current Exercise Habits: The patient does not participate in regular exercise at present, Exercise limited by: None identified  Goals    . Patient Stated     Maintain healthy lifestyle      Depression Screen PHQ 2/9 Scores 02/17/2020 12/02/2019 05/20/2019  PHQ - 2 Score 0 2 1  PHQ- 9 Score - 3 2    Fall Risk  Fall Risk  02/17/2020 12/02/2019 05/20/2019  Falls in the past year? 0 0 0  Number falls in past yr: 0 - -  Injury with Fall? 0 - -  Follow up Falls prevention discussed - -    FALL RISK PREVENTION PERTAINING TO THE HOME:  Any stairs in or around the home? Yes  If so, are there any without handrails? No  Home free of loose throw rugs in walkways, pet beds, electrical cords, etc? Yes  Adequate lighting in your home to reduce risk of falls? Yes   ASSISTIVE DEVICES UTILIZED TO PREVENT FALLS:  Life alert? No  Use of a cane, walker or w/c? No  Grab bars in the bathroom? Yes  Shower chair or bench in shower? No  Elevated toilet seat or a handicapped toilet? No   TIMED UP AND GO:  Was the test performed? No . Phone visit   Cognitive Function:Normal cognitive status assessed by  this Nurse Health Advisor. No abnormalities found.          Immunizations Immunization History  Administered Date(s) Administered  . Influenza, Quadrivalent, Recombinant, Inj, Pf  11/10/2017  . Influenza-Unspecified 12/20/2019  . PFIZER(Purple Top)SARS-COV-2 Vaccination 11/17/2019, 12/20/2019  . Pneumococcal Conjugate-13 07/22/2014  . Pneumococcal Polysaccharide-23 01/12/2016    TDAP status: Due, Education has been provided regarding the importance of this vaccine. Advised may receive this vaccine at local pharmacy or Health Dept. Aware to provide a copy of the vaccination record if obtained from local pharmacy or Health Dept. Verbalized acceptance and understanding.  Flu Vaccine status: Up to date  Pneumococcal vaccine status: Up to date  Covid-19 vaccine status: Completed vaccines  Qualifies for Shingles Vaccine? Yes   Zostavax completed No   Shingrix Completed?: No.    Education has been provided regarding the importance of this vaccine. Patient has been advised to call insurance company to determine out of pocket expense if they have not yet received this vaccine. Advised may also receive vaccine at local pharmacy or Health Dept. Verbalized acceptance and understanding.  Screening Tests Health Maintenance  Topic Date Due  . Hepatitis C Screening  Never done  . TETANUS/TDAP  Never done  . DEXA SCAN  Never done  . MAMMOGRAM  12/08/2014  . COVID-19 Vaccine (3 - Booster for Pfizer series) 06/18/2020  . COLONOSCOPY (Pts 45-24yrs Insurance coverage will need to be confirmed)  07/22/2022  . INFLUENZA VACCINE  Completed  . PNA vac Low Risk Adult  Completed    Health Maintenance  Health Maintenance Due  Topic Date Due  . Hepatitis C Screening  Never done  . TETANUS/TDAP  Never done  . DEXA SCAN  Never done  . MAMMOGRAM  12/08/2014    Colorectal cancer screening: Type of screening: Colonoscopy. Completed 07/21/2012. Repeat every 10 years  Mammogram status: Due-Declined  Bone Density status:Due- Declined  Lung Cancer Screening: (Low Dose CT Chest recommended if Age 60-80 years, 30 pack-year currently smoking OR have quit w/in 15years.) does not qualify.     Additional Screening:  Hepatitis C Screening: does qualify; Discuss with PCP  Vision Screening: Recommended annual ophthalmology exams for early detection of glaucoma and other disorders of the eye. Is the patient up to date with their annual eye exam?  No  Who is the provider or what is the name of the office in which the patient attends annual eye exams? Unsure of name Patient to schedule an appt  Dental Screening: Recommended annual dental exams for proper oral hygiene  Community Resource Referral / Chronic Care Management: CRR required this visit?  No   CCM required this visit?  No      Plan:     I have personally reviewed and noted the following in the patient's chart:   . Medical and social history . Use of alcohol, tobacco or illicit drugs  . Current medications and supplements . Functional ability and status . Nutritional status . Physical activity . Advanced directives . List of other physicians . Hospitalizations, surgeries, and ER visits in previous 12 months . Vitals . Screenings to include cognitive, depression, and falls . Referrals and appointments  In addition, I have reviewed and discussed with patient certain preventive protocols, quality metrics, and best practice recommendations. A written personalized care plan for preventive services as well as general preventive health recommendations were provided to patient.   Due to this being a telephonic visit, the after visit summary with patients personalized plan was offered to patient via mail or my-chart. Per request, patient was mailed a copy of AVS.  Roanna Raider, LPN   06/07/3265  Nurse Health Advisor  Nurse Notes: None

## 2020-02-17 NOTE — Patient Instructions (Signed)
[name] , Thank you for taking time to complete your Medicare Wellness Visit. I appreciate your ongoing commitment to your health goals. Please review the following plan we discussed and let me know if I can assist you in the future.   Screening recommendations/referrals: Colonoscopy: Completed 07/21/2012-Due 07/22/2022 Mammogram: Due-Declined at this time. Please call the office to schedule if you change your mind. Bone Density: Due-Declined at this time. Please call the office to schedule if you change your mind. Recommended yearly ophthalmology/optometry visit for glaucoma screening and checkup Recommended yearly dental visit for hygiene and checkup  Vaccinations: Influenza vaccine: Up to date Pneumococcal vaccine: Completed vaccines Tdap vaccine: Discuss with pharmacy Shingles vaccine: Discuss with pharmacy Covid-19:Discuss with pharmacy  Advanced directives: Information mailed  Conditions/risks identified: See problem list  Next appointment: Follow up in one year for your annual wellness visit    Preventive Care 65 Years and Older, Female Preventive care refers to lifestyle choices and visits with your health care provider that can promote health and wellness. What does preventive care include?  A yearly physical exam. This is also called an annual well check.  Dental exams once or twice a year.  Routine eye exams. Ask your health care provider how often you should have your eyes checked.  Personal lifestyle choices, including:  Daily care of your teeth and gums.  Regular physical activity.  Eating a healthy diet.  Avoiding tobacco and drug use.  Limiting alcohol use.  Practicing safe sex.  Taking low-dose aspirin every day.  Taking vitamin and mineral supplements as recommended by your health care provider. What happens during an annual well check? The services and screenings done by your health care provider during your annual well check will depend on your  age, overall health, lifestyle risk factors, and family history of disease. Counseling  Your health care provider may ask you questions about your:  Alcohol use.  Tobacco use.  Drug use.  Emotional well-being.  Home and relationship well-being.  Sexual activity.  Eating habits.  History of falls.  Memory and ability to understand (cognition).  Work and work Astronomer.  Reproductive health. Screening  You may have the following tests or measurements:  Height, weight, and BMI.  Blood pressure.  Lipid and cholesterol levels. These may be checked every 5 years, or more frequently if you are over 95 years old.  Skin check.  Lung cancer screening. You may have this screening every year starting at age 78 if you have a 30-pack-year history of smoking and currently smoke or have quit within the past 15 years.  Fecal occult blood test (FOBT) of the stool. You may have this test every year starting at age 47.  Flexible sigmoidoscopy or colonoscopy. You may have a sigmoidoscopy every 5 years or a colonoscopy every 10 years starting at age 60.  Hepatitis C blood test.  Hepatitis B blood test.  Sexually transmitted disease (STD) testing.  Diabetes screening. This is done by checking your blood sugar (glucose) after you have not eaten for a while (fasting). You may have this done every 1-3 years.  Bone density scan. This is done to screen for osteoporosis. You may have this done starting at age 4.  Mammogram. This may be done every 1-2 years. Talk to your health care provider about how often you should have regular mammograms. Talk with your health care provider about your test results, treatment options, and if necessary, the need for more tests. Vaccines  Your health care  provider may recommend certain vaccines, such as:  Influenza vaccine. This is recommended every year.  Tetanus, diphtheria, and acellular pertussis (Tdap, Td) vaccine. You may need a Td booster  every 10 years.  Zoster vaccine. You may need this after age 38.  Pneumococcal 13-valent conjugate (PCV13) vaccine. One dose is recommended after age 12.  Pneumococcal polysaccharide (PPSV23) vaccine. One dose is recommended after age 80. Talk to your health care provider about which screenings and vaccines you need and how often you need them. This information is not intended to replace advice given to you by your health care provider. Make sure you discuss any questions you have with your health care provider. Document Released: 02/12/2015 Document Revised: 10/06/2015 Document Reviewed: 11/17/2014 Elsevier Interactive Patient Education  2017 [place] Prevention in the Home Falls can cause injuries. They can happen to people of all ages. There are many things you can do to make your home safe and to help prevent falls. What can I do on the outside of my home?  Regularly fix the edges of walkways and driveways and fix any cracks.  Remove anything that might make you trip as you walk through a door, such as a raised step or threshold.  Trim any bushes or trees on the path to your home.  Use bright outdoor lighting.  Clear any walking paths of anything that might make someone trip, such as rocks or tools.  Regularly check to see if handrails are loose or broken. Make sure that both sides of any steps have handrails.  Any raised decks and porches should have guardrails on the edges.  Have any leaves, snow, or ice cleared regularly.  Use sand or salt on walking paths during winter.  Clean up any spills in your garage right away. This includes oil or grease spills. What can I do in the bathroom?  Use night lights.  Install grab bars by the toilet and in the tub and shower. Do not use towel bars as grab bars.  Use non-skid mats or decals in the tub or shower.  If you need to sit down in the shower, use a plastic, non-slip stool.  Keep the floor dry. Clean up any  water that spills on the floor as soon as it happens.  Remove soap buildup in the tub or shower regularly.  Attach bath mats securely with double-sided non-slip rug tape.  Do not have throw rugs and other things on the floor that can make you trip. What can I do in the bedroom?  Use night lights.  Make sure that you have a light by your bed that is easy to reach.  Do not use any sheets or blankets that are too big for your bed. They should not hang down onto the floor.  Have a firm chair that has side arms. You can use this for support while you get dressed.  Do not have throw rugs and other things on the floor that can make you trip. What can I do in the kitchen?  Clean up any spills right away.  Avoid walking on wet floors.  Keep items that you use a lot in easy-to-reach places.  If you need to reach something above you, use a strong step stool that has a grab bar.  Keep electrical cords out of the way.  Do not use floor polish or wax that makes floors slippery. If you must use wax, use non-skid floor wax.  Do not have throw  rugs and other things on the floor that can make you trip. What can I do with my stairs?  Do not leave any items on the stairs.  Make sure that there are handrails on both sides of the stairs and use them. Fix handrails that are broken or loose. Make sure that handrails are as long as the stairways.  Check any carpeting to make sure that it is firmly attached to the stairs. Fix any carpet that is loose or worn.  Avoid having throw rugs at the top or bottom of the stairs. If you do have throw rugs, attach them to the floor with carpet tape.  Make sure that you have a light switch at the top of the stairs and the bottom of the stairs. If you do not have them, ask someone to add them for you. What else can I do to help prevent falls?  Wear shoes that:  Do not have high heels.  Have rubber bottoms.  Are comfortable and fit you well.  Are closed  at the toe. Do not wear sandals.  If you use a stepladder:  Make sure that it is fully opened. Do not climb a closed stepladder.  Make sure that both sides of the stepladder are locked into place.  Ask someone to hold it for you, if possible.  Clearly mark and make sure that you can see:  Any grab bars or handrails.  First and last steps.  Where the edge of each step is.  Use tools that help you move around (mobility aids) if they are needed. These include:  Canes.  Walkers.  Scooters.  Crutches.  Turn on the lights when you go into a dark area. Replace any light bulbs as soon as they burn out.  Set up your furniture so you have a clear path. Avoid moving your furniture around.  If any of your floors are uneven, fix them.  If there are any pets around you, be aware of where they are.  Review your medicines with your doctor. Some medicines can make you feel dizzy. This can increase your chance of falling. Ask your doctor what other things that you can do to help prevent falls. This information is not intended to replace advice given to you by your health care provider. Make sure you discuss any questions you have with your health care provider. Document Released: 11/12/2008 Document Revised: 06/24/2015 Document Reviewed: 02/20/2014 Elsevier Interactive Patient Education  2017 [employer].

## 2020-04-30 ENCOUNTER — Other Ambulatory Visit: Payer: Self-pay | Admitting: Family Medicine

## 2020-04-30 DIAGNOSIS — I10 Essential (primary) hypertension: Secondary | ICD-10-CM

## 2020-07-20 ENCOUNTER — Other Ambulatory Visit: Payer: Self-pay | Admitting: Family Medicine

## 2020-07-20 DIAGNOSIS — I10 Essential (primary) hypertension: Secondary | ICD-10-CM

## 2020-09-18 ENCOUNTER — Other Ambulatory Visit: Payer: Self-pay | Admitting: Family Medicine

## 2020-09-18 DIAGNOSIS — I10 Essential (primary) hypertension: Secondary | ICD-10-CM

## 2020-10-11 ENCOUNTER — Telehealth: Payer: Self-pay | Admitting: Family Medicine

## 2020-10-11 NOTE — Chronic Care Management (AMB) (Signed)
  Chronic Care Management   Outreach Note  10/11/2020 Name: Donna [name] MRN: [id number] DOB: 11/14/1950  Referred by: Mliss Sax, MD Reason for referral : No chief complaint on file.   An unsuccessful telephone outreach was attempted today. The patient was referred to the pharmacist for assistance with care management and care coordination.   Follow Up Plan:   Tatjana Dellinger Upstream Scheduler

## 2020-10-12 ENCOUNTER — Other Ambulatory Visit: Payer: Self-pay

## 2020-10-12 ENCOUNTER — Ambulatory Visit (INDEPENDENT_AMBULATORY_CARE_PROVIDER_SITE_OTHER): Payer: Medicare Other | Admitting: Otolaryngology

## 2020-10-12 DIAGNOSIS — H6123 Impacted cerumen, bilateral: Secondary | ICD-10-CM | POA: Diagnosis not present

## 2020-10-12 NOTE — Progress Notes (Signed)
HPI: Keshawna Dix is a 70 y.o. female who presents for evaluation of wax buildup in her ears especially on the left side.  She was last cleaned about 5 months ago..  Past Medical History:  Diagnosis Date   Essential hypertension 05/22/2017   Past Surgical History:  Procedure Laterality Date   CHOLECYSTECTOMY N/A 05/18/2017   Procedure: LAPAROSCOPIC CHOLECYSTECTOMY WITH INTRAOPERATIVE CHOLANGIOGRAM;  Surgeon: Griselda Miner, MD;  Location: [facility];  Service: General;  Laterality: N/A;   ENDOSCOPIC RETROGRADE CHOLANGIOPANCREATOGRAPHY (ERCP) WITH PROPOFOL N/A 05/21/2017   Procedure: ENDOSCOPIC RETROGRADE CHOLANGIOPANCREATOGRAPHY (ERCP) WITH PROPOFOL;  Surgeon: Vida Rigger, MD;  Location: [facility];  Service: Endoscopy;  Laterality: N/A;   Social History   Socioeconomic History   Marital status: Married    Spouse name: Not on file   Number of children: Not on file   Years of education: Not on file   Highest education level: Not on file  Occupational History   Not on file  Tobacco Use   Smoking status: Never   Smokeless tobacco: Never  Substance and Sexual Activity   Alcohol use: Never   Drug use: Never   Sexual activity: Yes  Other Topics Concern   Not on file  Social History Narrative   Not on file   Social Determinants of Health   Financial Resource Strain: Low Risk    Difficulty of Paying Living Expenses: Not hard at all  Food Insecurity: No Food Insecurity   Worried About Running Out of Food in the Last Year: Never true   Ran Out of Food in the Last Year: Never true  Transportation Needs: No Transportation Needs   Lack of Transportation (Medical): No   Lack of Transportation (Non-Medical): No  Physical Activity: Inactive   Days of Exercise per Week: 0 days   Minutes of Exercise per Session: 0 min  Stress: No Stress Concern Present   Feeling of Stress : Only a little  Social Connections: Moderately Isolated   Frequency of Communication with Friends and Family: More  than three times a week   Frequency of Social Gatherings with Friends and Family: More than three times a week   Attends Religious Services: Never   Database administrator or Organizations: No   Attends Banker Meetings: Never   Marital Status: Married   No family history on file. Allergies  Allergen Reactions   Penicillins     Unsure of the reaction- happened around age 73   Prior to Admission medications   Medication Sig Start Date End Date Taking? Authorizing Provider  amLODipine (NORVASC) 10 MG tablet TAKE ONE TABLET BY MOUTH DAILY 04/30/20   Mliss Sax, MD  aspirin EC 81 MG tablet Take 81 mg by mouth every other day. 08/25/16   [provider]  chlorthalidone (HYGROTON) 25 MG tablet TAKE ONE TABLET BY MOUTH DAILY 09/20/20   Mliss Sax, MD  FLUoxetine (PROZAC) 10 MG capsule TAKE ONE CAPSULE BY MOUTH DAILY Patient not taking: Reported on 02/17/2020 01/29/20   Mliss Sax, MD  pantoprazole (PROTONIX) 40 MG tablet Take 1 tablet (40 mg total) by mouth daily for 14 days. 09/25/19 10/09/19  Lorin Glass, MD  rosuvastatin (CRESTOR) 10 MG tablet Take 1 tablet (10 mg total) by mouth daily. 11/07/19   Mliss Sax, MD  spironolactone (ALDACTONE) 25 MG tablet TAKE ONE TABLET BY MOUTH DAILY 09/20/20   Mliss Sax, MD     Positive ROS: Otherwise negative  All  other systems have been reviewed and were otherwise negative with the exception of those mentioned in the HPI and as above.  Physical Exam: Constitutional: Alert, well-appearing, no acute distress Ears: External ears without lesions or tenderness. Ear canals large amount of wax buildup on the left side small amount of wax on the right side.  This was cleaned with curettes.  TMs were otherwise clear.. Nasal: External nose without lesions. Clear nasal passages Oral: Oropharynx clear. Neck: No palpable adenopathy or masses Respiratory: Breathing comfortably  Skin: No  facial/neck lesions or rash noted.  Cerumen impaction removal  Date/Time: 10/12/2020 4:18 PM Performed by: Drema Halon, MD Authorized by: Drema Halon, MD   Consent:    Consent obtained:  Verbal   Consent given by:  Patient   Risks discussed:  Pain and bleeding Procedure details:    Location:  L ear and R ear   Procedure type: curette   Post-procedure details:    Inspection:  TM intact and canal normal   Hearing quality:  Improved   Procedure completion:  Tolerated well, no immediate complications Comments:     Large amount of wax on the left side and a small amount of wax on the right side.  TMs were clear bilaterally.  Assessment: Wax buildup left side worse than right side.  Plan: This was cleaned in the office. She will follow-up as needed.  Narda Bonds, MD

## 2020-10-18 ENCOUNTER — Telehealth: Payer: Self-pay | Admitting: Family Medicine

## 2020-10-18 ENCOUNTER — Other Ambulatory Visit: Payer: Self-pay | Admitting: Family Medicine

## 2020-10-18 DIAGNOSIS — I10 Essential (primary) hypertension: Secondary | ICD-10-CM

## 2020-10-18 NOTE — Progress Notes (Signed)
  Chronic Care Management   Outreach Note  10/18/2020 Name: Donna [name] MRN: [id number] DOB: November 26, 1950  Referred by: Mliss Sax, MD Reason for referral : No chief complaint on file.   A second unsuccessful telephone outreach was attempted today. The patient was referred to pharmacist for assistance with care management and care coordination.  Follow Up Plan:   Tatjana Dellinger Upstream Scheduler

## 2020-10-18 NOTE — Progress Notes (Signed)
  Chronic Care Management   Note  10/18/2020 Name: Donna [name] MRN: [id number] DOB: Dec 21, 1950  Donna [name] is a 70 y.o. year old female who is a primary care patient of Mliss Sax, MD. I reached out to Rochele Raring by phone today in response to a referral sent by Donna [name] PCP, Mliss Sax, MD.   Donna [name] was given information about Chronic Care Management services today including:  CCM service includes personalized support from designated clinical staff supervised by her physician, including individualized plan of care and coordination with other care providers 24/7 contact phone numbers for assistance for urgent and routine care needs. Service will only be billed when office clinical staff spend 20 minutes or more in a month to coordinate care. Only one practitioner may furnish and bill the service in a calendar month. The patient may stop CCM services at any time (effective at the end of the month) by phone call to the office staff.   Patient agreed to services and verbal consent obtained.   Follow up plan:  Tatjana Restaurant manager, fast food

## 2020-11-15 ENCOUNTER — Other Ambulatory Visit: Payer: Self-pay | Admitting: Family Medicine

## 2020-11-15 DIAGNOSIS — I10 Essential (primary) hypertension: Secondary | ICD-10-CM

## 2020-11-30 ENCOUNTER — Telehealth: Payer: Self-pay

## 2020-11-30 NOTE — Progress Notes (Signed)
    Chronic Care Management Pharmacy Assistant   Name: Donna [name]  MRN: [id number] DOB: 04-09-1950  Chart review for clinical pharmacist on 12/02/2020 at 1:30 pm.  Conditions to be addressed/monitored: HTN, HLD, Anxiety, and Vitamin D deficiency  Primary concerns for visit include:  Patient states she would like to discuss Vitamin D3, and how much she should take.Patient states she takes Vitamin D3 - 2,000 mg daily.   Recent office visits:  No recent office visit  Recent consult visits:  10/12/2020 Dr. Ezzard Standing MD (Otolaryngology) No medication changes noted  Hospital visits:  None in previous 6 months  Have you seen any other providers since your last visit?   Patient denies seeing any other providers since last visit.  Any changes in your medications or health?   Patient denies any changes in her medications.Patient states "I have been taking the same medications for years".  Any side effects from any medications?   Patient denies any side effects at this time.  Do you have an symptoms or problems not managed by your medications?   Patient denies any problems or symptoms.  Any concerns about your health right now?   Patient states she would like to discuss Vitamin D3, and how much she should take.Patient states she takes Vitamin D3 - 2,000 mg daily.  Has your provider asked that you check blood pressure, blood sugar, or follow special diet at home? Yes  Patient reports checking her blood pressure at home.  Do you get any type of exercise on a regular basis?   Patient states she walks as much as she can daily.  Can you think of a goal you would like to reach for your health?   Patient will inform the clinical pharmacist.  Do you have any problems getting your medications?   Patient denies any problems getting her medications.  Is there anything that you would like to discuss during the appointment?   Patient states she would like to discuss Vitamin D3, and how much  she should take.Patient states she takes Vitamin D3 - 2,000 mg daily.  Please bring medications and supplements to appointment  Patient is aware to have her medications and supplements at her appointment.  Medications: Outpatient Encounter Medications as of 11/30/2020  Medication Sig   amLODipine (NORVASC) 10 MG tablet TAKE ONE TABLET BY MOUTH DAILY   aspirin EC 81 MG tablet Take 81 mg by mouth every other day.   chlorthalidone (HYGROTON) 25 MG tablet TAKE ONE TABLET BY MOUTH DAILY   FLUoxetine (PROZAC) 10 MG capsule TAKE ONE CAPSULE BY MOUTH DAILY (Patient not taking: Reported on 02/17/2020)   pantoprazole (PROTONIX) 40 MG tablet Take 1 tablet (40 mg total) by mouth daily for 14 days.   rosuvastatin (CRESTOR) 10 MG tablet Take 1 tablet (10 mg total) by mouth daily.   spironolactone (ALDACTONE) 25 MG tablet TAKE ONE TABLET BY MOUTH DAILY   No facility-administered encounter medications on file as of 11/30/2020.    Care Gaps: Hepatitis C Screening Tetanus vaccine Shingrix vaccine Dexa scan Mammogram COVID-19 Vaccine (3- Booster for pfizer series) Influenza Vaccine. Star Rating Drugs: Rosuvastatin 10 mg last filled 10/18/2020 90 day supply at [facility] pharmacy Medication Fill Gaps: Amlodipine  10 MG last filled 07/20/2020 90 day supply  Chlorthalidone 25 MG last filled 10/18/2020 30 day supply  Spironolactone 25 MG last filled 10/18/2020 30 day supply   [employer] Clinical Pharmacist Assistant [phone]

## 2020-12-02 ENCOUNTER — Ambulatory Visit (INDEPENDENT_AMBULATORY_CARE_PROVIDER_SITE_OTHER): Payer: Medicare Other

## 2020-12-02 DIAGNOSIS — E782 Mixed hyperlipidemia: Secondary | ICD-10-CM

## 2020-12-02 DIAGNOSIS — I10 Essential (primary) hypertension: Secondary | ICD-10-CM

## 2020-12-02 NOTE — Progress Notes (Signed)
Chronic Care Management Pharmacy Note  12/07/2020 Name:  Donna [name] MRN:  [id number] DOB:  1950/03/27  Summary: Patient presents for initial CCM consult. She is overdue for follow-up with her PCP.  Recommendations/Changes made from today's visit: -Counseled to switch chlorthalidone administration to AM or around noon, and take amlodipine in the evening to assist with nocturia symptoms  -Given patient age, low cardiovascular risk, unclear long-term benefit of aspirin for primary prevention. Could consider discontinuing at this time.   Plan: CPP follow-up 6 months Assisted with scheduling PCP follow-up.   Subjective: Donna [name] is an 70 y.o. year old female who is a primary patient of Ethelene Hal Mortimer Fries, MD.  The CCM team was consulted for assistance with disease management and care coordination needs.    Engaged with patient by telephone for initial visit in response to provider referral for pharmacy case management and/or care coordination services.   Consent to Services:  The patient was given the following information about Chronic Care Management services today, agreed to services, and gave verbal consent: 1. CCM service includes personalized support from designated clinical staff supervised by the primary care provider, including individualized plan of care and coordination with other care providers 2. 24/7 contact phone numbers for assistance for urgent and routine care needs. 3. Service will only be billed when office clinical staff spend 20 minutes or more in a month to coordinate care. 4. Only one practitioner may furnish and bill the service in a calendar month. 5.The patient may stop CCM services at any time (effective at the end of the month) by phone call to the office staff. 6. The patient will be responsible for cost sharing (co-pay) of up to 20% of the service fee (after annual deductible is met). Patient agreed to services and consent obtained.  Patient Care  Team: Libby Maw, MD as PCP - General (Family Medicine) Germaine Pomfret, [facility] as Pharmacist (Pharmacist)  Recent office visits: 12/02/19: Video visit with Dr. Ethelene Hal for follow-up.  02/17/20: Patient presented to Caroleen Hamman, LPN for AWV.   Recent consult visits: 10/12/2020 Dr. Lucia Gaskins MD (Otolaryngology) No medication changes noted  Hospital visits: None in previous 6 months   Objective:  Lab Results  Component Value Date   CREATININE 0.67 09/25/2019   BUN 25 (H) 09/25/2019   GFR 83.94 05/20/2019   GFRNONAA >60 09/25/2019   GFRAA >60 09/25/2019   NA 135 09/25/2019   K 3.5 09/25/2019   CALCIUM 8.1 (L) 09/25/2019   CO2 29 09/25/2019   GLUCOSE 170 (H) 09/25/2019    Lab Results  Component Value Date/Time   HGBA1C 5.7 05/20/2019 02:27 PM   GFR 83.94 05/20/2019 02:27 PM    Last diabetic Eye exam: No results found for: HMDIABEYEEXA  Last diabetic Foot exam: No results found for: HMDIABFOOTEX   Lab Results  Component Value Date   CHOL 178 05/20/2019   HDL 56.90 05/20/2019   LDLCALC 91 05/20/2019   LDLDIRECT 104.0 05/20/2019   TRIG 144 09/21/2019   CHOLHDL 3 05/20/2019    Hepatic Function Latest Ref Rng & Units 09/25/2019 09/24/2019 09/23/2019  Total Protein 6.5 - 8.1 g/dL 6.0(L) 5.7(L) 6.6  Albumin 3.5 - 5.0 g/dL 2.9(L) 2.7(L) 2.9(L)  AST 15 - 41 U/L 33 31 37  ALT 0 - 44 U/L '31 26 28  ' Alk Phosphatase 38 - 126 U/L 46 46 56  Total Bilirubin 0.3 - 1.2 mg/dL 1.1 0.9 1.0  Bilirubin, Direct 0.1 - 0.5 mg/dL - - -  No results found for: TSH, FREET4  CBC Latest Ref Rng & Units 09/25/2019 09/24/2019 09/23/2019  WBC 4.0 - 10.5 K/uL 8.9 10.1 6.7  Hemoglobin 12.0 - 15.0 g/dL 12.3 12.2 13.1  Hematocrit 36.0 - 46.0 % 37.5 37.4 39.8  Platelets 150 - 400 K/uL 329 322 309    Lab Results  Component Value Date/Time   VD25OH 49.58 05/20/2019 02:27 PM    Clinical ASCVD: No  The ASCVD Risk score (Arnett DK, et al., 2019) failed to calculate for the following  reasons:   The systolic blood pressure is missing    Depression screen [facility] 2/9 02/17/2020 12/02/2019 05/20/2019  Decreased Interest 0 1 0  Down, Depressed, Hopeless 0 1 1  PHQ - 2 Score 0 2 1  Altered sleeping - 0 0  Tired, decreased energy - 1 1  Change in appetite - 0 0  Feeling bad or failure about yourself  - 0 0  Trouble concentrating - 0 0  Moving slowly or fidgety/restless - 0 0  Suicidal thoughts - 0 0  PHQ-9 Score - 3 2  Difficult doing work/chores - Not difficult at all Not difficult at all    Social History   Tobacco Use  Smoking Status Never  Smokeless Tobacco Never   BP Readings from Last 3 Encounters:  09/25/19 122/67  07/31/19 128/66  07/02/19 (!) 158/70   Pulse Readings from Last 3 Encounters:  09/25/19 (!) 56  07/31/19 60  07/02/19 80   Wt Readings from Last 3 Encounters:  02/17/20 177 lb (80.3 kg)  09/21/19 187 lb 6.3 oz (85 kg)  07/31/19 200 lb 6.4 oz (90.9 kg)   BMI Readings from Last 3 Encounters:  02/17/20 32.37 kg/m  12/02/19 34.27 kg/m  11/06/19 34.27 kg/m    Assessment/Interventions: Review of patient past medical history, allergies, medications, health status, including review of consultants reports, laboratory and other test data, was performed as part of comprehensive evaluation and provision of chronic care management services.   SDOH:  (Social Determinants of Health) assessments and interventions performed: Yes SDOH Interventions    Flowsheet Row Most Recent Value  SDOH Interventions   Financial Strain Interventions Intervention Not Indicated      SDOH Screenings   Alcohol Screen: Low Risk    Last Alcohol Screening Score (AUDIT): 0  Depression (PHQ2-9): Low Risk    PHQ-2 Score: 0  Financial Resource Strain: Low Risk    Difficulty of Paying Living Expenses: Not hard at all  Food Insecurity: No Food Insecurity   Worried About Charity fundraiser in the Last Year: Never true   Ran Out of Food in the Last Year: Never true   Housing: Low Risk    Last Housing Risk Score: 0  Physical Activity: Inactive   Days of Exercise per Week: 0 days   Minutes of Exercise per Session: 0 min  Social Connections: Moderately Isolated   Frequency of Communication with Friends and Family: More than three times a week   Frequency of Social Gatherings with Friends and Family: More than three times a week   Attends Religious Services: Never   Marine scientist or Organizations: No   Attends Archivist Meetings: Never   Marital Status: Married  Stress: No Stress Concern Present   Feeling of Stress : Only a little  Tobacco Use: Low Risk    Smoking Tobacco Use: Never   Smokeless Tobacco Use: Never   Passive Exposure: Not on file  Transportation  Needs: No Transportation Needs   Lack of Transportation (Medical): No   Lack of Transportation (Non-Medical): No    CCM Care Plan  Allergies  Allergen Reactions   Penicillins     Unsure of the reaction- happened around age 9    Medications Reviewed Today     Reviewed by Germaine Pomfret, [facility] (Pharmacist) on 12/02/20 at 1402  Med List Status: <None>   Medication Order Taking? Sig Documenting Provider Last Dose Status Informant  amLODipine (NORVASC) 10 MG tablet [id number] Yes TAKE ONE TABLET BY MOUTH DAILY Libby Maw, MD Taking Active   aspirin EC 81 MG tablet [id number] Yes Take 81 mg by mouth every other day. [provider] Taking Active Spouse/Significant Other  chlorthalidone (HYGROTON) 25 MG tablet [id number] Yes TAKE ONE TABLET BY MOUTH DAILY Libby Maw, MD Taking Active   Cholecalciferol (VITAMIN D) 50 MCG (2000 UT) CAPS [id number] Yes Take 2,000 Units by mouth daily. [provider] Taking Active   Multiple Vitamin (MULTIVITAMIN WITH MINERALS) TABS tablet [id number] Yes Take 1 tablet by mouth daily. One-A-Day 65+ Proactive [provider] Taking Active   rosuvastatin (CRESTOR) 10 MG tablet [id number] Yes Take  1 tablet (10 mg total) by mouth daily. Libby Maw, MD Taking Active   spironolactone (ALDACTONE) 25 MG tablet [id number] Yes TAKE ONE TABLET BY MOUTH DAILY Libby Maw, MD Taking Active             Patient Active Problem List   Diagnosis Date Noted   Anxiety 11/06/2019   History of COVID-19 11/06/2019   Acute hypoxemic respiratory failure due to COVID-19 [facility]) 09/21/2019   Hypokalemia 09/21/2019   Family history of problems related to stress 07/02/2019   Dermatitis 07/02/2019   Non-compliant patient 05/20/2019   Elevated glucose 12/13/2017   Vitamin D deficiency 12/13/2017   Class 2 obesity due to excess calories with body mass index (BMI) of 38.0 to 38.9 in adult 12/13/2017   Recent major surgery 06/07/2017   Essential hypertension 05/22/2017   Mixed hyperlipidemia 05/22/2017   Gallstones 05/18/2017    Immunization History  Administered Date(s) Administered   Influenza, Quadrivalent, Recombinant, Inj, Pf 11/10/2017   Influenza-Unspecified 12/20/2019   PFIZER(Purple Top)SARS-COV-2 Vaccination 11/17/2019, 12/20/2019   Pneumococcal Conjugate-13 07/22/2014   Pneumococcal Polysaccharide-23 01/12/2016    Conditions to be addressed/monitored:  Hypertension and Hyperlipidemia  Care Plan : General Pharmacy (Adult)  Updates made by Germaine Pomfret, [place] since 12/07/2020 12:00 AM     Problem: Hypertension and Hyperlipidemia   Priority: High     Long-Range Goal: Patient-Specific Goal   Start Date: 12/07/2020  Expected End Date: 12/07/2021  This Visit's Progress: On track  Priority: High  Note:   Current Barriers:  No barriers noted   Pharmacist Clinical Goal(s):  Patient will maintain control of blood pressure as evidenced by BP less than 140/90  through collaboration with PharmD and provider.   Interventions: 1:1 collaboration with Libby Maw, MD regarding development and update of comprehensive plan of care as evidenced by provider  attestation and co-signature Inter-disciplinary care team collaboration (see longitudinal plan of care) Comprehensive medication review performed; medication list updated in electronic medical record  Hypertension (BP goal <140/90) -Controlled -Current treatment: Amlodipine 10 mg daily (noon)  Chlorthalidone 25 mg daily (Evening) Spironolactone 25 mg daily (10 am)  -Medications previously tried: Metoprolol, Lisinopril  -Current home readings: 297 systolic, 989-211H.  -Current dietary habits: Water, mostly vegetarian diet.  -Denies hypotensive/hypertensive symptoms -Counseled to  switch chlorthalidone administration to AM or around noon, and take amlodipine in the evening to assist with nocturia symptoms -Recommended to continue current medication  Hyperlipidemia: (LDL goal < 100) -Controlled -Current treatment: Rosuvastatin 20 mg daily  -Current treatment: Aspirin 81 mg every other daily -Medications previously tried: NA  -No family history of heart disease per patient  -Given patient age, low cardiovascular risk, unclear long-term benefit of aspirin for primary prevention. Could consider discontinuing at this time.  -Recommended to continue current medication  Patient Goals/Self-Care Activities Patient will:  - check glucose weekly, document, and provide at future appointments  Follow Up Plan: Telephone follow up appointment with care management team member scheduled for:  06/02/2021 at 3:00 PM      Medication Assistance: None required.  Patient affirms current coverage meets needs.  Compliance/Adherence/Medication fill history: Care Gaps: Hepatitis C Screening Tetanus vaccine Shingrix vaccine Dexa scan Mammogram COVID-19 Vaccine (3- Booster for pfizer series) Influenza Vaccine.  Star-Rating Drugs: Rosuvastatin 10 mg last filled 10/18/2020 90 day supply at [facility]  Patient's preferred pharmacy is:  [facility] [id number] - [address] [place] [address] Phone: [phone] Fax: [phone]  Uses pill box? Yes Pt endorses 100% compliance  We discussed: Current pharmacy is preferred with insurance plan and patient is satisfied with pharmacy services Patient decided to: Continue current medication management strategy  Care Plan and Follow Up Patient Decision:  Patient agrees to Care Plan and Follow-up.  Plan: Telephone follow up appointment with care management team member scheduled for:  06/02/2021 at 3:00 PM  Junius Argyle, PharmD, [place], CPP Clinical Pharmacist Practitioner  [facility]  [phone]

## 2020-12-07 NOTE — Patient Instructions (Signed)
Visit Information It was great speaking with you today!  Please let me know if you have any questions about our visit.   Goals Addressed             This Visit's Progress    Track and Manage My Blood Pressure-Hypertension       Timeframe:  Long-Range Goal Priority:  High Start Date: 12/07/2020                             Expected End Date: 12/07/2021                      Follow Up within 90 days   - check blood pressure weekly    Why is this important?   You won't feel high blood pressure, but it can still hurt your blood vessels.  High blood pressure can cause heart or kidney problems. It can also cause a stroke.  Making lifestyle changes like losing a little weight or eating less salt will help.  Checking your blood pressure at home and at different times of the day can help to control blood pressure.  If the doctor prescribes medicine remember to take it the way the doctor ordered.  Call the office if you cannot afford the medicine or if there are questions about it.     Notes:         Patient Care Plan: General Pharmacy (Adult)     Problem Identified: Hypertension and Hyperlipidemia   Priority: High     Long-Range Goal: Patient-Specific Goal   Start Date: 12/07/2020  Expected End Date: 12/07/2021  This Visit's Progress: On track  Priority: High  Note:   Current Barriers:  No barriers noted   Pharmacist Clinical Goal(s):  Patient will maintain control of blood pressure as evidenced by BP less than 140/90  through collaboration with PharmD and provider.   Interventions: 1:1 collaboration with Mliss Sax, MD regarding development and update of comprehensive plan of care as evidenced by provider attestation and co-signature Inter-disciplinary care team collaboration (see longitudinal plan of care) Comprehensive medication review performed; medication list updated in electronic medical record  Hypertension (BP goal <140/90) -Controlled -Current  treatment: Amlodipine 10 mg daily (noon)  Chlorthalidone 25 mg daily (Evening) Spironolactone 25 mg daily (10 am)  -Medications previously tried: Metoprolol, Lisinopril  -Current home readings: 126 systolic, 120-130s.  -Current dietary habits: Water, mostly vegetarian diet.  -Denies hypotensive/hypertensive symptoms -Counseled to switch chlorthalidone administration to AM or around noon, and take amlodipine in the evening to assist with nocturia symptoms -Recommended to continue current medication  Hyperlipidemia: (LDL goal < 100) -Controlled -Current treatment: Rosuvastatin 20 mg daily  -Current treatment: Aspirin 81 mg every other daily -Medications previously tried: NA  -No family history of heart disease per patient  -Given patient age, low cardiovascular risk, unclear long-term benefit of aspirin for primary prevention. Could consider discontinuing at this time.  -Recommended to continue current medication  Patient Goals/Self-Care Activities Patient will:  - check glucose weekly, document, and provide at future appointments  Follow Up Plan: Telephone follow up appointment with care management team member scheduled for:  06/02/2021 at 3:00 PM       Ms. Roper was given information about Chronic Care Management services today including:  CCM service includes personalized support from designated clinical staff supervised by her physician, including individualized plan of care and coordination with other care providers 24/7 contact  phone numbers for assistance for urgent and routine care needs. Standard insurance, coinsurance, copays and deductibles apply for chronic care management only during months in which we provide at least 20 minutes of these services. Most insurances cover these services at 100%, however patients may be responsible for any copay, coinsurance and/or deductible if applicable. This service may help you avoid the need for more expensive face-to-face  services. Only one practitioner may furnish and bill the service in a calendar month. The patient may stop CCM services at any time (effective at the end of the month) by phone call to the office staff.  Patient agreed to services and verbal consent obtained.   The patient verbalized understanding of instructions, educational materials, and care plan provided today and declined offer to receive copy of patient instructions, educational materials, and care plan.   Angelena Sole, PharmD, Patsy Baltimore, CPP Clinical Pharmacist Practitioner  [facility]  [phone]

## 2020-12-29 DIAGNOSIS — I10 Essential (primary) hypertension: Secondary | ICD-10-CM

## 2020-12-29 DIAGNOSIS — E782 Mixed hyperlipidemia: Secondary | ICD-10-CM

## 2021-01-14 ENCOUNTER — Ambulatory Visit (INDEPENDENT_AMBULATORY_CARE_PROVIDER_SITE_OTHER): Payer: Medicare Other | Admitting: Family Medicine

## 2021-01-14 ENCOUNTER — Encounter: Payer: Self-pay | Admitting: Family Medicine

## 2021-01-14 ENCOUNTER — Other Ambulatory Visit: Payer: Self-pay

## 2021-01-14 VITALS — BP 140/78 | HR 87 | Temp 97.7°F | Ht 62.0 in

## 2021-01-14 DIAGNOSIS — E782 Mixed hyperlipidemia: Secondary | ICD-10-CM

## 2021-01-14 DIAGNOSIS — R7309 Other abnormal glucose: Secondary | ICD-10-CM

## 2021-01-14 DIAGNOSIS — R011 Cardiac murmur, unspecified: Secondary | ICD-10-CM

## 2021-01-14 DIAGNOSIS — Z0000 Encounter for general adult medical examination without abnormal findings: Secondary | ICD-10-CM | POA: Diagnosis not present

## 2021-01-14 DIAGNOSIS — E559 Vitamin D deficiency, unspecified: Secondary | ICD-10-CM | POA: Diagnosis not present

## 2021-01-14 DIAGNOSIS — I10 Essential (primary) hypertension: Secondary | ICD-10-CM | POA: Diagnosis not present

## 2021-01-14 NOTE — Progress Notes (Signed)
Established Patient Office Visit  Subjective:  Patient ID: Donna [name], female    DOB: 1950-06-23  Age: 70 y.o. MRN: [id number]  CC:  Chief Complaint  Patient presents with   Follow-up    Pt is here for meds review.     HPI Donna [name] presents for health check and follow-up of her hypertension.  Claims compliance with all of her medications.  Her daughter is back home.  She is clean.  She is being followed by mental health.  Patient continues to raise her daughters sons.  Patient says the daughter is not particularly involved.  Denies headaches lightheadedness or blurred vision.  Past Medical History:  Diagnosis Date   Essential hypertension 05/22/2017    Past Surgical History:  Procedure Laterality Date   CHOLECYSTECTOMY N/A 05/18/2017   Procedure: LAPAROSCOPIC CHOLECYSTECTOMY WITH INTRAOPERATIVE CHOLANGIOGRAM;  Surgeon: Griselda Miner, MD;  Location: [facility];  Service: General;  Laterality: N/A;   ENDOSCOPIC RETROGRADE CHOLANGIOPANCREATOGRAPHY (ERCP) WITH PROPOFOL N/A 05/21/2017   Procedure: ENDOSCOPIC RETROGRADE CHOLANGIOPANCREATOGRAPHY (ERCP) WITH PROPOFOL;  Surgeon: Vida Rigger, MD;  Location: [facility];  Service: Endoscopy;  Laterality: N/A;    Family History  Problem Relation Age of Onset   Cancer Father     Social History   Socioeconomic History   Marital status: Married    Spouse name: Not on file   Number of children: Not on file   Years of education: Not on file   Highest education level: Not on file  Occupational History   Not on file  Tobacco Use   Smoking status: Never   Smokeless tobacco: Never  Substance and Sexual Activity   Alcohol use: Never   Drug use: Never   Sexual activity: Yes  Other Topics Concern   Not on file  Social History Narrative   Not on file   Social Determinants of Health   Financial Resource Strain: Low Risk    Difficulty of Paying Living Expenses: Not hard at all  Food Insecurity: No Food Insecurity   Worried About  Programme researcher, broadcasting/film/video in the Last Year: Never true   Ran Out of Food in the Last Year: Never true  Transportation Needs: No Transportation Needs   Lack of Transportation (Medical): No   Lack of Transportation (Non-Medical): No  Physical Activity: Inactive   Days of Exercise per Week: 0 days   Minutes of Exercise per Session: 0 min  Stress: No Stress Concern Present   Feeling of Stress : Only a little  Social Connections: Moderately Isolated   Frequency of Communication with Friends and Family: More than three times a week   Frequency of Social Gatherings with Friends and Family: More than three times a week   Attends Religious Services: Never   Database administrator or Organizations: No   Attends Engineer, structural: Never   Marital Status: Married  Catering manager Violence: Not At Risk   Fear of Current or Ex-Partner: No   Emotionally Abused: No   Physically Abused: No   Sexually Abused: No    Outpatient Medications Prior to Visit  Medication Sig Dispense Refill   amLODipine (NORVASC) 10 MG tablet TAKE ONE TABLET BY MOUTH DAILY 30 tablet 1   aspirin EC 81 MG tablet Take 81 mg by mouth every other day.     chlorthalidone (HYGROTON) 25 MG tablet TAKE ONE TABLET BY MOUTH DAILY 30 tablet 1   Cholecalciferol (VITAMIN D) 50 MCG (2000 UT) CAPS Take 2,000  Units by mouth daily.     Multiple Vitamin (MULTIVITAMIN WITH MINERALS) TABS tablet Take 1 tablet by mouth daily. One-A-Day 65+ Proactive     rosuvastatin (CRESTOR) 10 MG tablet Take 1 tablet (10 mg total) by mouth daily. 90 tablet 2   spironolactone (ALDACTONE) 25 MG tablet TAKE ONE TABLET BY MOUTH DAILY 30 tablet 1   No facility-administered medications prior to visit.    Allergies  Allergen Reactions   Penicillins     Unsure of the reaction- happened around age 64    ROS Review of Systems  Constitutional:  Negative for diaphoresis, fatigue, fever and unexpected weight change.  HENT: Negative.    Eyes:  Negative  for photophobia and visual disturbance.  Respiratory: Negative.    Cardiovascular: Negative.   Gastrointestinal: Negative.   Endocrine: Negative for polyphagia and polyuria.  Genitourinary: Negative.   Skin:  Negative for pallor and rash.  Neurological:  Negative for dizziness, speech difficulty, weakness, light-headedness, numbness and headaches.  Psychiatric/Behavioral:  The patient is nervous/anxious.      Objective:    Physical Exam Vitals and nursing note reviewed.  Constitutional:      General: She is not in acute distress.    Appearance: Normal appearance. She is not ill-appearing, toxic-appearing or diaphoretic.  HENT:     Head: Normocephalic and atraumatic.     Right Ear: Tympanic membrane, ear canal and external ear normal.     Left Ear: Tympanic membrane, ear canal and external ear normal.     Mouth/Throat:     Mouth: Mucous membranes are moist.     Pharynx: Oropharynx is clear. No oropharyngeal exudate or posterior oropharyngeal erythema.  Eyes:     General: No scleral icterus.       Right eye: No discharge.        Left eye: No discharge.     Extraocular Movements: Extraocular movements intact.     Conjunctiva/sclera: Conjunctivae normal.     Pupils: Pupils are equal, round, and reactive to light.  Neck:     Vascular: No carotid bruit.  Cardiovascular:     Rate and Rhythm: Normal rate and regular rhythm.     Heart sounds: Murmur heard.  Systolic murmur is present with a grade of 3/6.     Comments: Systolic murmur noted over the right upper sternal border Pulmonary:     Effort: Pulmonary effort is normal.     Breath sounds: Normal breath sounds.  Abdominal:     General: Bowel sounds are normal.  Musculoskeletal:     Cervical back: No rigidity or tenderness.     Right lower leg: No edema.     Left lower leg: No edema.  Lymphadenopathy:     Cervical: No cervical adenopathy.  Skin:    General: Skin is warm and dry.  Neurological:     Mental Status: She is  alert and oriented to person, place, and time.  Psychiatric:        Mood and Affect: Mood normal.        Behavior: Behavior normal.    BP 140/78 (BP Location: Right Arm, Cuff Size: Large)    Pulse 87    Temp 97.7 F (36.5 C) (Temporal)    Ht 5\' 2"  (1.575 m)    SpO2 96%    BMI 32.37 kg/m  Wt Readings from Last 3 Encounters:  02/17/20 177 lb (80.3 kg)  09/21/19 187 lb 6.3 oz (85 kg)  07/31/19 200 lb 6.4 oz (90.9  kg)     Health Maintenance Due  Topic Date Due   Hepatitis C Screening  Never done   TETANUS/TDAP  Never done   Zoster Vaccines- Shingrix (1 of 2) Never done   DEXA SCAN  Never done   MAMMOGRAM  12/08/2014   COVID-19 Vaccine (3 - Booster for Pfizer series) 02/14/2020   INFLUENZA VACCINE  08/30/2020    There are no preventive care reminders to display for this patient.  No results found for: TSH Lab Results  Component Value Date   WBC 8.9 09/25/2019   HGB 12.3 09/25/2019   HCT 37.5 09/25/2019   MCV 89.9 09/25/2019   PLT 329 09/25/2019   Lab Results  Component Value Date   NA 135 09/25/2019   K 3.5 09/25/2019   CO2 29 09/25/2019   GLUCOSE 170 (H) 09/25/2019   BUN 25 (H) 09/25/2019   CREATININE 0.67 09/25/2019   BILITOT 1.1 09/25/2019   ALKPHOS 46 09/25/2019   AST 33 09/25/2019   ALT 31 09/25/2019   PROT 6.0 (L) 09/25/2019   ALBUMIN 2.9 (L) 09/25/2019   CALCIUM 8.1 (L) 09/25/2019   ANIONGAP 9 09/25/2019   GFR 83.94 05/20/2019   Lab Results  Component Value Date   CHOL 178 05/20/2019   Lab Results  Component Value Date   HDL 56.90 05/20/2019   Lab Results  Component Value Date   LDLCALC 91 05/20/2019   Lab Results  Component Value Date   TRIG 144 09/21/2019   Lab Results  Component Value Date   CHOLHDL 3 05/20/2019   Lab Results  Component Value Date   HGBA1C 5.7 05/20/2019      Assessment & Plan:   Problem List Items Addressed This Visit       Cardiovascular and Mediastinum   Essential hypertension - Primary (Chronic)    Relevant Orders   CBC   Comprehensive metabolic panel   Urinalysis, Routine w reflex microscopic   Microalbumin / creatinine urine ratio     Other   Mixed hyperlipidemia   Relevant Orders   Comprehensive metabolic panel   Lipid panel   Elevated glucose   Relevant Orders   Hemoglobin A1c   Vitamin D deficiency   Relevant Orders   VITAMIN D 25 Hydroxy (Vit-D Deficiency, Fractures)   Heart murmur   Healthcare maintenance   Relevant Orders   MM Digital Screening   DG Bone Density    No orders of the defined types were placed in this encounter.   Follow-up: Return in about 3 months (around 04/14/2021).  Discuss referral for echo next visit.  Return fasting for blood work.  Will bring BP cuff with her.  Encouraged compliance.  Information was given on managing hypertension as well as health maintenance and disease prevention.  Agrees to go for a mammogram and bone density testing.  Libby Maw, MD

## 2021-01-16 ENCOUNTER — Other Ambulatory Visit: Payer: Self-pay | Admitting: Family Medicine

## 2021-01-16 DIAGNOSIS — I10 Essential (primary) hypertension: Secondary | ICD-10-CM

## 2021-01-16 DIAGNOSIS — E782 Mixed hyperlipidemia: Secondary | ICD-10-CM

## 2021-01-20 ENCOUNTER — Telehealth: Payer: Self-pay | Admitting: Family Medicine

## 2021-01-21 NOTE — Telephone Encounter (Signed)
Spoke with patient who verbally understood BP medications was 30 day supply due to possibility of being changed after blood work.

## 2021-01-26 ENCOUNTER — Telehealth: Payer: Self-pay

## 2021-01-26 NOTE — Progress Notes (Signed)
Chronic Care Management Pharmacy Assistant   Name: Donna [name]  MRN: [id number] DOB: 10-Jul-1950  Reason for Encounter:Hypertension Disease State Call.   Recent office visits:  01/14/2021 Dr.Kremer MD (PCP) No Medication Changes noted.  Recent consult visits:  No recent consult Visit  Hospital visits:  None in previous 6 months  Medications: Outpatient Encounter Medications as of 01/26/2021  Medication Sig   amLODipine (NORVASC) 10 MG tablet TAKE ONE TABLET BY MOUTH DAILY   aspirin EC 81 MG tablet Take 81 mg by mouth every other day.   chlorthalidone (HYGROTON) 25 MG tablet TAKE ONE TABLET BY MOUTH DAILY   Cholecalciferol (VITAMIN D) 50 MCG (2000 UT) CAPS Take 2,000 Units by mouth daily.   Multiple Vitamin (MULTIVITAMIN WITH MINERALS) TABS tablet Take 1 tablet by mouth daily. One-A-Day 65+ Proactive   rosuvastatin (CRESTOR) 10 MG tablet TAKE ONE TABLET BY MOUTH DAILY   spironolactone (ALDACTONE) 25 MG tablet TAKE ONE TABLET BY MOUTH DAILY   No facility-administered encounter medications on file as of 01/26/2021.   Care Gaps: Hepatitis C Screening Tetanus vaccine Shingrix vaccine COVID-19 Vaccine (3- Booster for pfizer series) Influenza Vaccine. Star Rating Drugs: Rosuvastatin 10 mg last filled 01/17/2021 90 day supply at [facility] pharmacy Medication Fill Gaps: None  Reviewed chart prior to disease state call. Spoke with patient regarding BP  Recent Office Vitals: BP Readings from Last 3 Encounters:  01/14/21 140/78  09/25/19 122/67  07/31/19 128/66   Pulse Readings from Last 3 Encounters:  01/14/21 87  09/25/19 (!) 56  07/31/19 60    Wt Readings from Last 3 Encounters:  02/17/20 177 lb (80.3 kg)  09/21/19 187 lb 6.3 oz (85 kg)  07/31/19 200 lb 6.4 oz (90.9 kg)     Kidney Function Lab Results  Component Value Date/Time   CREATININE 0.67 09/25/2019 04:03 AM   CREATININE 0.64 09/24/2019 04:38 AM   GFR 83.94 05/20/2019 02:27 PM   GFRNONAA >60  09/25/2019 04:03 AM   GFRAA >60 09/25/2019 04:03 AM    BMP Latest Ref Rng & Units 09/25/2019 09/24/2019 09/23/2019  Glucose 70 - 99 mg/dL 759(F) 638(G) 665(L)  BUN 8 - 23 mg/dL 93(T) 70(V) 77(L)  Creatinine 0.44 - 1.00 mg/dL 3.90 3.00 9.23  Sodium 135 - 145 mmol/L 135 136 131(L)  Potassium 3.5 - 5.1 mmol/L 3.5 3.7 3.4(L)  Chloride 98 - 111 mmol/L 97(L) 99 92(L)  CO2 22 - 32 mmol/L 29 27 29   Calcium 8.9 - 10.3 mg/dL 8.1(L) 7.6(L) 7.7(L)    Current antihypertensive regimen:  Amlodipine 10 mg daily (noon)  Chlorthalidone 25 mg daily (Evening) Spironolactone 25 mg daily (10 am)  How often are you checking your Blood Pressure? daily Current home BP readings: On 01/26/2021 it was 125/80 ,130/80. What recent interventions/DTPs have been made by any provider to improve Blood Pressure control since last CPP Visit: None ID Any recent hospitalizations or ED visits since last visit with CPP? No What diet changes have been made to improve Blood Pressure Control?  Patient denies any changes with her diet at this time. What exercise is being done to improve your Blood Pressure Control?  None ID  Patient states when she had a appointment with her PCP on 01/14/2021 her blood pressure was 140/78 because she gets white coat syndrome, but the nurse recheck her blood pressure three more times resulting in 125/80, 135/80.  Adherence Review: Is the patient currently on ACE/ARB medication? No Does the patient have >5 day gap  between last estimated fill dates? No  Telephone follow up appointment with care management team member scheduled for:  06/02/2021 at 3:00 PM  [facility] Clinical Pharmacist Assistant [phone]

## 2021-02-01 ENCOUNTER — Other Ambulatory Visit: Payer: Medicare Other

## 2021-02-15 ENCOUNTER — Other Ambulatory Visit: Payer: Self-pay | Admitting: Family Medicine

## 2021-02-15 DIAGNOSIS — I10 Essential (primary) hypertension: Secondary | ICD-10-CM

## 2021-03-17 ENCOUNTER — Other Ambulatory Visit: Payer: Self-pay | Admitting: Family Medicine

## 2021-03-17 DIAGNOSIS — I10 Essential (primary) hypertension: Secondary | ICD-10-CM

## 2021-03-21 ENCOUNTER — Telehealth: Payer: Self-pay | Admitting: Family Medicine

## 2021-03-21 NOTE — Telephone Encounter (Signed)
Patient aware that labs are put in as future patient can give [place] a call to schedule appointment when she is available to come in.

## 2021-03-21 NOTE — Telephone Encounter (Signed)
Left message for patient to call back and schedule Medicare Annual Wellness Visit (AWV) in office.   If not able to come in office, please offer to do virtually or by telephone.  Left office number and my jabber #[phone].  Last AWV:12/07/2020   Please schedule at anytime with Nurse Health Advisor.  

## 2021-03-21 NOTE — Telephone Encounter (Signed)
Pt had blood work scheduled in January. Her daughter is very sick and she has been out of town with her. She still wants to have the blood work. Can Dr Ethelene Hal put in another order for it or does she need a new one? She requested to hear from [place]. She said call her asap.

## 2021-03-22 ENCOUNTER — Ambulatory Visit (INDEPENDENT_AMBULATORY_CARE_PROVIDER_SITE_OTHER): Payer: Medicare Other

## 2021-03-22 DIAGNOSIS — Z0000 Encounter for general adult medical examination without abnormal findings: Secondary | ICD-10-CM | POA: Diagnosis not present

## 2021-03-22 NOTE — Patient Instructions (Signed)
Donna [name] , Thank you for taking time to come for your Medicare Wellness Visit. I appreciate your ongoing commitment to your health goals. Please review the following plan we discussed and let me know if I can assist you in the future.   Screening recommendations/referrals: Colonoscopy:07/21/2012 Mammogram: ordered 01/14/2021 Bone Density: ordered 12816/2022 Recommended yearly ophthalmology/optometry visit for glaucoma screening and checkup Recommended yearly dental visit for hygiene and checkup  Vaccinations: Influenza vaccine: completed  Pneumococcal vaccine: completed  Tdap vaccine: due  Shingles vaccine: will consider   Advanced directives: yes   Conditions/risks identified: none   Next appointment: none    Preventive Care 65 Years and Older, Female Preventive care refers to lifestyle choices and visits with your health care provider that can promote health and wellness. What does preventive care include? A yearly physical exam. This is also called an annual well check. Dental exams once or twice a year. Routine eye exams. Ask your health care provider how often you should have your eyes checked. Personal lifestyle choices, including: Daily care of your teeth and gums. Regular physical activity. Eating a healthy diet. Avoiding tobacco and drug use. Limiting alcohol use. Practicing safe sex. Taking low-dose aspirin every day. Taking vitamin and mineral supplements as recommended by your health care provider. What happens during an annual well check? The services and screenings done by your health care provider during your annual well check will depend on your age, overall health, lifestyle risk factors, and family history of disease. Counseling  Your health care provider may ask you questions about your: Alcohol use. Tobacco use. Drug use. Emotional well-being. Home and relationship well-being. Sexual activity. Eating habits. History of falls. Memory and ability  to understand (cognition). Work and work Astronomer. Reproductive health. Screening  You may have the following tests or measurements: Height, weight, and BMI. Blood pressure. Lipid and cholesterol levels. These may be checked every 5 years, or more frequently if you are over 76 years old. Skin check. Lung cancer screening. You may have this screening every year starting at age 5 if you have a 30-pack-year history of smoking and currently smoke or have quit within the past 15 years. Fecal occult blood test (FOBT) of the stool. You may have this test every year starting at age 87. Flexible sigmoidoscopy or colonoscopy. You may have a sigmoidoscopy every 5 years or a colonoscopy every 10 years starting at age 72. Hepatitis C blood test. Hepatitis B blood test. Sexually transmitted disease (STD) testing. Diabetes screening. This is done by checking your blood sugar (glucose) after you have not eaten for a while (fasting). You may have this done every 1-3 years. Bone density scan. This is done to screen for osteoporosis. You may have this done starting at age 18. Mammogram. This may be done every 1-2 years. Talk to your health care provider about how often you should have regular mammograms. Talk with your health care provider about your test results, treatment options, and if necessary, the need for more tests. Vaccines  Your health care provider may recommend certain vaccines, such as: Influenza vaccine. This is recommended every year. Tetanus, diphtheria, and acellular pertussis (Tdap, Td) vaccine. You may need a Td booster every 10 years. Zoster vaccine. You may need this after age 39. Pneumococcal 13-valent conjugate (PCV13) vaccine. One dose is recommended after age 24. Pneumococcal polysaccharide (PPSV23) vaccine. One dose is recommended after age 77. Talk to your health care provider about which screenings and vaccines you need and how often  you need them. This information is not  intended to replace advice given to you by your health care provider. Make sure you discuss any questions you have with your health care provider. Document Released: 02/12/2015 Document Revised: 10/06/2015 Document Reviewed: 11/17/2014 Elsevier Interactive Patient Education  2017 [place] Prevention in the Home Falls can cause injuries. They can happen to people of all ages. There are many things you can do to make your home safe and to help prevent falls. What can I do on the outside of my home? Regularly fix the edges of walkways and driveways and fix any cracks. Remove anything that might make you trip as you walk through a door, such as a raised step or threshold. Trim any bushes or trees on the path to your home. Use bright outdoor lighting. Clear any walking paths of anything that might make someone trip, such as rocks or tools. Regularly check to see if handrails are loose or broken. Make sure that both sides of any steps have handrails. Any raised decks and porches should have guardrails on the edges. Have any leaves, snow, or ice cleared regularly. Use sand or salt on walking paths during winter. Clean up any spills in your garage right away. This includes oil or grease spills. What can I do in the bathroom? Use night lights. Install grab bars by the toilet and in the tub and shower. Do not use towel bars as grab bars. Use non-skid mats or decals in the tub or shower. If you need to sit down in the shower, use a plastic, non-slip stool. Keep the floor dry. Clean up any water that spills on the floor as soon as it happens. Remove soap buildup in the tub or shower regularly. Attach bath mats securely with double-sided non-slip rug tape. Do not have throw rugs and other things on the floor that can make you trip. What can I do in the bedroom? Use night lights. Make sure that you have a light by your bed that is easy to reach. Do not use any sheets or blankets that are  too big for your bed. They should not hang down onto the floor. Have a firm chair that has side arms. You can use this for support while you get dressed. Do not have throw rugs and other things on the floor that can make you trip. What can I do in the kitchen? Clean up any spills right away. Avoid walking on wet floors. Keep items that you use a lot in easy-to-reach places. If you need to reach something above you, use a strong step stool that has a grab bar. Keep electrical cords out of the way. Do not use floor polish or wax that makes floors slippery. If you must use wax, use non-skid floor wax. Do not have throw rugs and other things on the floor that can make you trip. What can I do with my stairs? Do not leave any items on the stairs. Make sure that there are handrails on both sides of the stairs and use them. Fix handrails that are broken or loose. Make sure that handrails are as long as the stairways. Check any carpeting to make sure that it is firmly attached to the stairs. Fix any carpet that is loose or worn. Avoid having throw rugs at the top or bottom of the stairs. If you do have throw rugs, attach them to the floor with carpet tape. Make sure that you have a light  switch at the top of the stairs and the bottom of the stairs. If you do not have them, ask someone to add them for you. What else can I do to help prevent falls? Wear shoes that: Do not have high heels. Have rubber bottoms. Are comfortable and fit you well. Are closed at the toe. Do not wear sandals. If you use a stepladder: Make sure that it is fully opened. Do not climb a closed stepladder. Make sure that both sides of the stepladder are locked into place. Ask someone to hold it for you, if possible. Clearly mark and make sure that you can see: Any grab bars or handrails. First and last steps. Where the edge of each step is. Use tools that help you move around (mobility aids) if they are needed. These  include: Canes. Walkers. Scooters. Crutches. Turn on the lights when you go into a dark area. Replace any light bulbs as soon as they burn out. Set up your furniture so you have a clear path. Avoid moving your furniture around. If any of your floors are uneven, fix them. If there are any pets around you, be aware of where they are. Review your medicines with your doctor. Some medicines can make you feel dizzy. This can increase your chance of falling. Ask your doctor what other things that you can do to help prevent falls. This information is not intended to replace advice given to you by your health care provider. Make sure you discuss any questions you have with your health care provider. Document Released: 11/12/2008 Document Revised: 06/24/2015 Document Reviewed: 02/20/2014 Elsevier Interactive Patient Education  2017 [employer].

## 2021-03-22 NOTE — Progress Notes (Signed)
Subjective:   Lara Debiasi is a 71 y.o. female who presents for Medicare Annual (Subsequent) preventive examination.  Review of Systems     Cardiac Risk Factors include: advanced age (>16men, >64 women);hypertension     Objective:    Today's Vitals   There is no height or weight on file to calculate BMI.  Advanced Directives 03/22/2021 02/17/2020 09/21/2019 05/21/2017 05/18/2017  Does Patient Have a Medical Advance Directive? Yes No No No No  Type of Paramedic of [place];Living will - - - -  Copy of [place] in Chart? No - copy requested - - - -  Would patient like information on creating a medical advance directive? - Yes (MAU/Ambulatory/Procedural Areas - Information given) No - Patient declined No - Patient declined -    Current Medications (verified) Outpatient Encounter Medications as of 03/22/2021  Medication Sig   amLODipine (NORVASC) 10 MG tablet TAKE ONE TABLET BY MOUTH DAILY   aspirin EC 81 MG tablet Take 81 mg by mouth every other day.   chlorthalidone (HYGROTON) 25 MG tablet TAKE ONE TABLET BY MOUTH DAILY   Cholecalciferol (VITAMIN D) 50 MCG (2000 UT) CAPS Take 2,000 Units by mouth daily.   Multiple Vitamin (MULTIVITAMIN WITH MINERALS) TABS tablet Take 1 tablet by mouth daily. One-A-Day 65+ Proactive   rosuvastatin (CRESTOR) 10 MG tablet TAKE ONE TABLET BY MOUTH DAILY   spironolactone (ALDACTONE) 25 MG tablet TAKE ONE TABLET BY MOUTH DAILY   No facility-administered encounter medications on file as of 03/22/2021.    Allergies (verified) Penicillins   History: Past Medical History:  Diagnosis Date   Essential hypertension 05/22/2017   Past Surgical History:  Procedure Laterality Date   CHOLECYSTECTOMY N/A 05/18/2017   Procedure: LAPAROSCOPIC CHOLECYSTECTOMY WITH INTRAOPERATIVE CHOLANGIOGRAM;  Surgeon: Jovita Kussmaul, MD;  Location: [facility];  Service: General;  Laterality: N/A;   ENDOSCOPIC RETROGRADE  CHOLANGIOPANCREATOGRAPHY (ERCP) WITH PROPOFOL N/A 05/21/2017   Procedure: ENDOSCOPIC RETROGRADE CHOLANGIOPANCREATOGRAPHY (ERCP) WITH PROPOFOL;  Surgeon: Clarene Essex, MD;  Location: [facility];  Service: Endoscopy;  Laterality: N/A;   Family History  Problem Relation Age of Onset   Cancer Father    Social History   Socioeconomic History   Marital status: Married    Spouse name: Not on file   Number of children: Not on file   Years of education: Not on file   Highest education level: Not on file  Occupational History   Not on file  Tobacco Use   Smoking status: Never   Smokeless tobacco: Never  Substance and Sexual Activity   Alcohol use: Never   Drug use: Never   Sexual activity: Yes  Other Topics Concern   Not on file  Social History Narrative   Not on file   Social Determinants of Health   Financial Resource Strain: Low Risk    Difficulty of Paying Living Expenses: Not hard at all  Food Insecurity: No Food Insecurity   Worried About Charity fundraiser in the Last Year: Never true   [place] in the Last Year: Never true  Transportation Needs: No Transportation Needs   Lack of Transportation (Medical): No   Lack of Transportation (Non-Medical): No  Physical Activity: Insufficiently Active   Days of Exercise per Week: 5 days   Minutes of Exercise per Session: 20 min  Stress: No Stress Concern Present   Feeling of Stress : Not at all  Social Connections: Socially Integrated   Frequency  of Communication with Friends and Family: Twice a week   Frequency of Social Gatherings with Friends and Family: Twice a week   Attends Religious Services: More than 4 times per year   Active Member of [employer] or Organizations: Yes   Attends Archivist Meetings: Never   Marital Status: Married    Tobacco Counseling Counseling given: Not Answered   Clinical Intake:  Pre-visit preparation completed: Yes  Pain : No/denies pain     Nutritional Risks:  None Diabetes: No  How often do you need to have someone help you when you read instructions, pamphlets, or other written materials from your doctor or pharmacy?: 1 - Never What is the last grade level you completed in school?: college  Diabetic?no   Interpreter Needed?: No  Information entered by :: [place] of Daily Living In your present state of health, do you have any difficulty performing the following activities: 03/22/2021  Hearing? N  Vision? N  Difficulty concentrating or making decisions? N  Walking or climbing stairs? N  Dressing or bathing? N  Doing errands, shopping? N  Preparing Food and eating ? N  Using the Toilet? N  In the past six months, have you accidently leaked urine? N  Do you have problems with loss of bowel control? N  Managing your Medications? N  Managing your Finances? N  Housekeeping or managing your Housekeeping? N  Some recent data might be hidden    Patient Care Team: Libby Maw, MD as PCP - General (Family Medicine) Germaine Pomfret, [facility] as Pharmacist (Pharmacist)  Indicate any recent Medical Services you may have received from other than [facility] providers in the past year (date may be approximate).     Assessment:   This is a routine wellness examination for [place].  Hearing/Vision screen No results found.  Dietary issues and exercise activities discussed: Current Exercise Habits: Home exercise routine, Type of exercise: walking, Time (Minutes): 30, Frequency (Times/Week): 5, Weekly Exercise (Minutes/Week): 150, Intensity: Mild, Exercise limited by: None identified   Goals Addressed             This Visit's Progress    Patient Stated   On track    Maintain healthy lifestyle       Depression Screen PHQ 2/9 Scores 03/22/2021 03/22/2021 02/17/2020 12/02/2019 05/20/2019  PHQ - 2 Score 0 0 0 2 1  PHQ- 9 Score - - - 3 2    Fall Risk Fall Risk  03/22/2021 02/17/2020 12/02/2019 05/20/2019  Falls in the past  year? 0 0 0 0  Number falls in past yr: 0 0 - -  Injury with Fall? 0 0 - -  Risk for fall due to : No Fall Risks - - -  Follow up Falls evaluation completed Falls prevention discussed - -    FALL RISK PREVENTION PERTAINING TO THE HOME:  Any stairs in or around the home? Yes  If so, are there any without handrails? No  Home free of loose throw rugs in walkways, pet beds, electrical cords, etc? Yes  Adequate lighting in your home to reduce risk of falls? Yes   ASSISTIVE DEVICES UTILIZED TO PREVENT FALLS:  Life alert? No  Use of a cane, walker or w/c? No  Grab bars in the bathroom? No  Shower chair or bench in shower? Yes  Elevated toilet seat or a handicapped toilet? No    Cognitive Function:  Normal cognitive status assessed by direct observation by this  Nurse Health Advisor. No abnormalities found.        Immunizations Immunization History  Administered Date(s) Administered   Influenza, Quadrivalent, Recombinant, Inj, Pf 11/10/2017   Influenza-Unspecified 12/20/2019   PFIZER(Purple Top)SARS-COV-2 Vaccination 11/17/2019, 12/20/2019   Pneumococcal Conjugate-13 07/22/2014   Pneumococcal Polysaccharide-23 01/12/2016    TDAP status: Due, Education has been provided regarding the importance of this vaccine. Advised may receive this vaccine at local pharmacy or Health Dept. Aware to provide a copy of the vaccination record if obtained from local pharmacy or Health Dept. Verbalized acceptance and understanding.  Flu Vaccine status: Up to date  Pneumococcal vaccine status: Up to date  Covid-19 vaccine status: Completed vaccines  Qualifies for Shingles Vaccine? Yes   Zostavax completed No   Shingrix Completed?: No.    Education has been provided regarding the importance of this vaccine. Patient has been advised to call insurance company to determine out of pocket expense if they have not yet received this vaccine. Advised may also receive vaccine at local pharmacy or Health  Dept. Verbalized acceptance and understanding.  Screening Tests Health Maintenance  Topic Date Due   Hepatitis C Screening  Never done   TETANUS/TDAP  Never done   Zoster Vaccines- Shingrix (1 of 2) Never done   DEXA SCAN  Never done   MAMMOGRAM  12/08/2014   COVID-19 Vaccine (3 - Booster for Pfizer series) 02/14/2020   INFLUENZA VACCINE  08/30/2020   COLONOSCOPY (Pts 45-73yrs Insurance coverage will need to be confirmed)  07/22/2022   Pneumonia Vaccine 5+ Years old  Completed   HPV VACCINES  Aged Out    Health Maintenance  Health Maintenance Due  Topic Date Due   Hepatitis C Screening  Never done   TETANUS/TDAP  Never done   Zoster Vaccines- Shingrix (1 of 2) Never done   DEXA SCAN  Never done   MAMMOGRAM  12/08/2014   COVID-19 Vaccine (3 - Booster for Pfizer series) 02/14/2020   INFLUENZA VACCINE  08/30/2020    Colorectal cancer screening: Type of screening: Colonoscopy. Completed 07/21/2012. Repeat every 10 years  Mammogram status: Ordered 01/14/2021. Pt provided with contact info and advised to call to schedule appt.   Bone Density status: Ordered 01/14/2021. Pt provided with contact info and advised to call to schedule appt.  Lung Cancer Screening: (Low Dose CT Chest recommended if Age 63-80 years, 30 pack-year currently smoking OR have quit w/in 15years.) does not qualify.   Lung Cancer Screening Referral: n/a  Additional Screening:  Hepatitis C Screening: does not qualify;   Vision Screening: Recommended annual ophthalmology exams for early detection of glaucoma and other disorders of the eye. Is the patient up to date with their annual eye exam?  Yes  Who is the provider or what is the name of the office in which the patient attends annual eye exams? Dr.Fox  If pt is not established with a provider, would they like to be referred to a provider to establish care? No .   Dental Screening: Recommended annual dental exams for proper oral hygiene  Community  Resource Referral / Chronic Care Management: CRR required this visit?  No   CCM required this visit?  No      Plan:     I have personally reviewed and noted the following in the patients chart:   Medical and social history Use of alcohol, tobacco or illicit drugs  Current medications and supplements including opioid prescriptions.  Functional ability and status Nutritional status Physical activity  Advanced directives List of other physicians Hospitalizations, surgeries, and ER visits in previous 12 months Vitals Screenings to include cognitive, depression, and falls Referrals and appointments  In addition, I have reviewed and discussed with patient certain preventive protocols, quality metrics, and best practice recommendations. A written personalized care plan for preventive services as well as general preventive health recommendations were provided to patient.     Randel Pigg, LPN   [id number]   Nurse Notes: none

## 2021-04-14 ENCOUNTER — Ambulatory Visit: Payer: Medicare Other | Admitting: Family Medicine

## 2021-04-21 ENCOUNTER — Ambulatory Visit: Payer: Medicare Other | Admitting: Family Medicine

## 2021-04-29 ENCOUNTER — Ambulatory Visit (INDEPENDENT_AMBULATORY_CARE_PROVIDER_SITE_OTHER): Payer: Medicare Other

## 2021-04-29 ENCOUNTER — Encounter: Payer: Self-pay | Admitting: Family Medicine

## 2021-04-29 ENCOUNTER — Ambulatory Visit (INDEPENDENT_AMBULATORY_CARE_PROVIDER_SITE_OTHER): Payer: Medicare Other | Admitting: Family Medicine

## 2021-04-29 VITALS — BP 146/72 | HR 85 | Temp 97.7°F | Ht 62.0 in | Wt 194.2 lb

## 2021-04-29 DIAGNOSIS — E782 Mixed hyperlipidemia: Secondary | ICD-10-CM | POA: Diagnosis not present

## 2021-04-29 DIAGNOSIS — J22 Unspecified acute lower respiratory infection: Secondary | ICD-10-CM

## 2021-04-29 DIAGNOSIS — E559 Vitamin D deficiency, unspecified: Secondary | ICD-10-CM

## 2021-04-29 DIAGNOSIS — Z0000 Encounter for general adult medical examination without abnormal findings: Secondary | ICD-10-CM | POA: Diagnosis not present

## 2021-04-29 DIAGNOSIS — I10 Essential (primary) hypertension: Secondary | ICD-10-CM

## 2021-04-29 DIAGNOSIS — R059 Cough, unspecified: Secondary | ICD-10-CM | POA: Diagnosis not present

## 2021-04-29 MED ORDER — ONDANSETRON HCL 4 MG PO TABS: 4.0000 mg | ORAL_TABLET | Freq: Three times a day (TID) | ORAL | 0 refills | Status: DC | PRN

## 2021-04-29 MED ORDER — PREDNISONE 10 MG PO TABS: 10.0000 mg | ORAL_TABLET | Freq: Two times a day (BID) | ORAL | 0 refills | Status: AC

## 2021-04-29 MED ORDER — AZITHROMYCIN 250 MG PO TABS: ORAL_TABLET | ORAL | 0 refills | Status: AC

## 2021-04-29 NOTE — Progress Notes (Signed)
? ?Established Patient Office Visit ? ?Subjective:  ?Patient ID: Donna [name], female    DOB: 1950-02-22  Age: 71 y.o. MRN: [id number] ? ?CC:  ?Chief Complaint  ?Patient presents with  ? Follow-up  ?  3 month follow up on BP, no concerns.   ? ? ?HPI ?Donna [name] presents for follow-up of hypertension, elevated cholesterol, vitamin D deficiency and a URI.  Reports an 8-day history of postnasal drip cough productive of clear to green phlegm with tightness and wheezing in the chest.  No tobacco history.  No history of asthma.  She has tried over-the-counter medications.  Blood pressure at home runs in the 120s over 130s over 70-80 range. ? ?Past Medical History:  ?Diagnosis Date  ? Essential hypertension 05/22/2017  ? ? ?Past Surgical History:  ?Procedure Laterality Date  ? CHOLECYSTECTOMY N/A 05/18/2017  ? Procedure: LAPAROSCOPIC CHOLECYSTECTOMY WITH INTRAOPERATIVE CHOLANGIOGRAM;  Surgeon: Jovita Kussmaul, MD;  Location: [facility];  Service: General;  Laterality: N/A;  ? ENDOSCOPIC RETROGRADE CHOLANGIOPANCREATOGRAPHY (ERCP) WITH PROPOFOL N/A 05/21/2017  ? Procedure: ENDOSCOPIC RETROGRADE CHOLANGIOPANCREATOGRAPHY (ERCP) WITH PROPOFOL;  Surgeon: Clarene Essex, MD;  Location: [facility];  Service: Endoscopy;  Laterality: N/A;  ? ? ?Family History  ?Problem Relation Age of Onset  ? Cancer Father   ? ? ?Social History  ? ?Socioeconomic History  ? Marital status: Married  ?  Spouse name: Not on file  ? Number of children: Not on file  ? Years of education: Not on file  ? Highest education level: Not on file  ?Occupational History  ? Not on file  ?Tobacco Use  ? Smoking status: Never  ? Smokeless tobacco: Never  ?Substance and Sexual Activity  ? Alcohol use: Never  ? Drug use: Never  ? Sexual activity: Yes  ?Other Topics Concern  ? Not on file  ?Social History Narrative  ? Not on file  ? ?Social Determinants of Health  ? ?Financial Resource Strain: Low Risk   ? Difficulty of Paying Living Expenses: Not hard at all  ?Food Insecurity:  No Food Insecurity  ? Worried About Charity fundraiser in the Last Year: Never true  ? Ran Out of Food in the Last Year: Never true  ?Transportation Needs: No Transportation Needs  ? Lack of Transportation (Medical): No  ? Lack of Transportation (Non-Medical): No  ?Physical Activity: Insufficiently Active  ? Days of Exercise per Week: 5 days  ? Minutes of Exercise per Session: 20 min  ?Stress: No Stress Concern Present  ? Feeling of Stress : Not at all  ?Social Connections: Socially Integrated  ? Frequency of Communication with Friends and Family: Twice a week  ? Frequency of Social Gatherings with Friends and Family: Twice a week  ? Attends Religious Services: More than 4 times per year  ? Active Member of Clubs or Organizations: Yes  ? Attends Archivist Meetings: Never  ? Marital Status: Married  ?Intimate Partner Violence: Not At Risk  ? Fear of Current or Ex-Partner: No  ? Emotionally Abused: No  ? Physically Abused: No  ? Sexually Abused: No  ? ? ?Outpatient Medications Prior to Visit  ?Medication Sig Dispense Refill  ? amLODipine (NORVASC) 10 MG tablet TAKE ONE TABLET BY MOUTH DAILY 90 tablet 1  ? aspirin EC 81 MG tablet Take 81 mg by mouth every other day.    ? chlorthalidone (HYGROTON) 25 MG tablet TAKE ONE TABLET BY MOUTH DAILY 30 tablet 1  ? Cholecalciferol (VITAMIN D)  50 MCG (2000 UT) CAPS Take 2,000 Units by mouth daily.    ? Multiple Vitamin (MULTIVITAMIN WITH MINERALS) TABS tablet Take 1 tablet by mouth daily. One-A-Day 65+ Proactive    ? rosuvastatin (CRESTOR) 10 MG tablet TAKE ONE TABLET BY MOUTH DAILY 90 tablet 2  ? spironolactone (ALDACTONE) 25 MG tablet TAKE ONE TABLET BY MOUTH DAILY 30 tablet 1  ? ?No facility-administered medications prior to visit.  ? ? ?Allergies  ?Allergen Reactions  ? Penicillins   ?  Unsure of the reaction- happened around age 14  ? ? ?ROS ?Review of Systems  ?Constitutional:  Negative for chills, diaphoresis, fatigue, fever and unexpected weight change.  ?HENT:   Positive for congestion and postnasal drip.   ?Eyes:  Negative for photophobia and visual disturbance.  ?Respiratory:  Positive for cough and wheezing. Negative for shortness of breath.   ?Cardiovascular: Negative.   ?Gastrointestinal:  Negative for abdominal pain, blood in stool and constipation.  ?Genitourinary: Negative.   ?Musculoskeletal: Negative.  Negative for arthralgias and myalgias.  ?Neurological:  Negative for speech difficulty, weakness, light-headedness and headaches.  ?Psychiatric/Behavioral: Negative.    ? ?  ?Objective:  ?  ?Physical Exam ?Vitals and nursing note reviewed.  ?Constitutional:   ?   General: She is not in acute distress. ?   Appearance: Normal appearance. She is not ill-appearing, toxic-appearing or diaphoretic.  ?HENT:  ?   Head: Normocephalic and atraumatic.  ?   Right Ear: Tympanic membrane, ear canal and external ear normal.  ?   Left Ear: Tympanic membrane, ear canal and external ear normal.  ?   Mouth/Throat:  ?   Mouth: Mucous membranes are moist.  ?   Pharynx: Oropharynx is clear. No oropharyngeal exudate or posterior oropharyngeal erythema.  ?Eyes:  ?   General:     ?   Right eye: No discharge.     ?   Left eye: No discharge.  ?   Extraocular Movements: Extraocular movements intact.  ?   Conjunctiva/sclera: Conjunctivae normal.  ?   Pupils: Pupils are equal, round, and reactive to light.  ?Neck:  ?   Vascular: No carotid bruit.  ?Cardiovascular:  ?   Rate and Rhythm: Normal rate and regular rhythm.  ?Pulmonary:  ?   Effort: Pulmonary effort is normal. No respiratory distress.  ?   Breath sounds: Wheezing present. No rales.  ?Abdominal:  ?   General: Bowel sounds are normal.  ?Musculoskeletal:  ?   Cervical back: No rigidity or tenderness.  ?   Right lower leg: No edema.  ?   Left lower leg: No edema.  ?Lymphadenopathy:  ?   Cervical: No cervical adenopathy.  ?Skin: ?   General: Skin is warm and dry.  ?Neurological:  ?   Mental Status: She is alert and oriented to person,  place, and time.  ?Psychiatric:     ?   Mood and Affect: Mood normal.     ?   Behavior: Behavior normal.  ? ? ?BP (!) 146/72 (BP Location: Right Arm, Patient Position: Sitting, Cuff Size: Large)   Pulse 85   Temp 97.7 ?F (36.5 ?C) (Temporal)   Ht 5\' 2"  (1.575 m)   Wt 194 lb 3.2 oz (88.1 kg)   SpO2 98%   BMI 35.52 kg/m?  ?Wt Readings from Last 3 Encounters:  ?04/29/21 194 lb 3.2 oz (88.1 kg)  ?02/17/20 177 lb (80.3 kg)  ?09/21/19 187 lb 6.3 oz (85 kg)  ? ? ? ?  Health Maintenance Due  ?Topic Date Due  ? Hepatitis C Screening  Never done  ? TETANUS/TDAP  Never done  ? Zoster Vaccines- Shingrix (1 of 2) Never done  ? DEXA SCAN  Never done  ? MAMMOGRAM  12/08/2014  ? ? ?There are no preventive care reminders to display for this patient. ? ?No results found for: TSH ?Lab Results  ?Component Value Date  ? WBC 8.9 09/25/2019  ? HGB 12.3 09/25/2019  ? HCT 37.5 09/25/2019  ? MCV 89.9 09/25/2019  ? PLT 329 09/25/2019  ? ?Lab Results  ?Component Value Date  ? NA 135 09/25/2019  ? K 3.5 09/25/2019  ? CO2 29 09/25/2019  ? GLUCOSE 170 (H) 09/25/2019  ? BUN 25 (H) 09/25/2019  ? CREATININE 0.67 09/25/2019  ? BILITOT 1.1 09/25/2019  ? ALKPHOS 46 09/25/2019  ? AST 33 09/25/2019  ? ALT 31 09/25/2019  ? PROT 6.0 (L) 09/25/2019  ? ALBUMIN 2.9 (L) 09/25/2019  ? CALCIUM 8.1 (L) 09/25/2019  ? ANIONGAP 9 09/25/2019  ? GFR 83.94 05/20/2019  ? ?Lab Results  ?Component Value Date  ? CHOL 178 05/20/2019  ? ?Lab Results  ?Component Value Date  ? HDL 56.90 05/20/2019  ? ?Lab Results  ?Component Value Date  ? [place] 91 05/20/2019  ? ?Lab Results  ?Component Value Date  ? TRIG 144 09/21/2019  ? ?Lab Results  ?Component Value Date  ? CHOLHDL 3 05/20/2019  ? ?Lab Results  ?Component Value Date  ? HGBA1C 5.7 05/20/2019  ? ? ?  ?Assessment & Plan:  ? ?Problem List Items Addressed This Visit   ? ?  ? Cardiovascular and Mediastinum  ? Essential hypertension - Primary (Chronic)  ? Relevant Orders  ? CBC  ? Comprehensive metabolic panel  ? Urinalysis,  Routine w reflex microscopic  ? Microalbumin / creatinine urine ratio  ?  ? Other  ? Mixed hyperlipidemia  ? Relevant Orders  ? Lipid panel  ? Vitamin D deficiency  ? Relevant Orders  ? VITAMIN D 25 Hydroxy (

## 2021-05-02 LAB — COMPREHENSIVE METABOLIC PANEL
AG Ratio: 1.2 (calc) (ref 1.0–2.5)
ALT: 15 U/L (ref 6–29)
AST: 15 U/L (ref 10–35)
Albumin: 4.3 g/dL (ref 3.6–5.1)
Alkaline phosphatase (APISO): 84 U/L (ref 37–153)
BUN: 22 mg/dL (ref 7–25)
CO2: 28 mmol/L (ref 20–32)
Calcium: 9.7 mg/dL (ref 8.6–10.4)
Chloride: 97 mmol/L — ABNORMAL LOW (ref 98–110)
Creat: 0.99 mg/dL (ref 0.60–1.00)
Globulin: 3.6 g/dL (calc) (ref 1.9–3.7)
Glucose, Bld: 110 mg/dL — ABNORMAL HIGH (ref 65–99)
Potassium: 3.4 mmol/L — ABNORMAL LOW (ref 3.5–5.3)
Sodium: 138 mmol/L (ref 135–146)
Total Bilirubin: 1.4 mg/dL — ABNORMAL HIGH (ref 0.2–1.2)
Total Protein: 7.9 g/dL (ref 6.1–8.1)

## 2021-05-02 LAB — LIPID PANEL
Cholesterol: 190 mg/dL (ref <200)
HDL: 49 mg/dL — ABNORMAL LOW (ref >=50)
LDL Cholesterol (Calc): 111 mg/dL (calc) — ABNORMAL HIGH
Non-HDL Cholesterol (Calc): 141 mg/dL (calc) — ABNORMAL HIGH (ref <130)
Total CHOL/HDL Ratio: 3.9 (calc) (ref <5.0)
Triglycerides: 189 mg/dL — ABNORMAL HIGH (ref <150)

## 2021-05-02 LAB — CBC
HCT: 43.7 % (ref 35.0–45.0)
Hemoglobin: 14.4 g/dL (ref 11.7–15.5)
MCH: 29.5 pg (ref 27.0–33.0)
MCHC: 33 g/dL (ref 32.0–36.0)
MCV: 89.5 fL (ref 80.0–100.0)
MPV: 10 fL (ref 7.5–12.5)
Platelets: 470 10*3/uL — ABNORMAL HIGH (ref 140–400)
RBC: 4.88 10*6/uL (ref 3.80–5.10)
RDW: 12.9 % (ref 11.0–15.0)
WBC: 10.6 10*3/uL (ref 3.8–10.8)

## 2021-05-02 LAB — URINALYSIS, ROUTINE W REFLEX MICROSCOPIC

## 2021-05-02 LAB — MICROALBUMIN / CREATININE URINE RATIO

## 2021-05-02 LAB — VITAMIN D 25 HYDROXY (VIT D DEFICIENCY, FRACTURES): Vit D, 25-Hydroxy: 64 ng/mL (ref 30–100)

## 2021-05-16 ENCOUNTER — Other Ambulatory Visit: Payer: Self-pay | Admitting: Family Medicine

## 2021-05-16 DIAGNOSIS — I10 Essential (primary) hypertension: Secondary | ICD-10-CM

## 2021-05-17 ENCOUNTER — Telehealth: Payer: Self-pay | Admitting: Family Medicine

## 2021-05-17 NOTE — Telephone Encounter (Signed)
Pt is having a dull L leg pain. She thinks this has something to do with her not taking vitamin D the way she use to . Please advise pt at [phone] ?

## 2021-05-18 NOTE — Telephone Encounter (Signed)
Patient states that her leg is feeling better and it seems to be better when the sun is out. She will call to schedule appointment if it starts back.  ?

## 2021-06-02 ENCOUNTER — Telehealth: Payer: Medicare Other

## 2021-07-15 ENCOUNTER — Other Ambulatory Visit: Payer: Self-pay | Admitting: Family Medicine

## 2021-07-15 DIAGNOSIS — I10 Essential (primary) hypertension: Secondary | ICD-10-CM

## 2021-09-13 ENCOUNTER — Other Ambulatory Visit: Payer: Self-pay | Admitting: Family Medicine

## 2021-09-13 DIAGNOSIS — I10 Essential (primary) hypertension: Secondary | ICD-10-CM

## 2021-10-04 ENCOUNTER — Other Ambulatory Visit: Payer: Self-pay | Admitting: Family Medicine

## 2021-10-04 DIAGNOSIS — E782 Mixed hyperlipidemia: Secondary | ICD-10-CM

## 2021-10-28 ENCOUNTER — Ambulatory Visit: Payer: Medicare Other | Admitting: Family Medicine

## 2021-11-03 DIAGNOSIS — H2513 Age-related nuclear cataract, bilateral: Secondary | ICD-10-CM | POA: Diagnosis not present

## 2021-11-16 ENCOUNTER — Other Ambulatory Visit: Payer: Self-pay | Admitting: Family Medicine

## 2021-11-16 DIAGNOSIS — I10 Essential (primary) hypertension: Secondary | ICD-10-CM

## 2021-12-29 ENCOUNTER — Other Ambulatory Visit: Payer: Self-pay | Admitting: Family Medicine

## 2021-12-29 DIAGNOSIS — I10 Essential (primary) hypertension: Secondary | ICD-10-CM

## 2022-01-10 ENCOUNTER — Other Ambulatory Visit: Payer: Self-pay | Admitting: Family Medicine

## 2022-01-10 DIAGNOSIS — I10 Essential (primary) hypertension: Secondary | ICD-10-CM

## 2022-01-12 ENCOUNTER — Other Ambulatory Visit: Payer: Self-pay | Admitting: Family Medicine

## 2022-01-12 DIAGNOSIS — I10 Essential (primary) hypertension: Secondary | ICD-10-CM

## 2022-02-11 ENCOUNTER — Other Ambulatory Visit: Payer: Self-pay | Admitting: Family Medicine

## 2022-02-11 DIAGNOSIS — I10 Essential (primary) hypertension: Secondary | ICD-10-CM

## 2022-02-23 ENCOUNTER — Telehealth: Payer: Self-pay | Admitting: Family Medicine

## 2022-02-23 NOTE — Telephone Encounter (Signed)
Spoke to spouse  he stated patient would call me back   schedule Medicare Annual Wellness Visit (AWV) either virtually or in office. Left  my Herbie Drape number [phone]   Last AWV 03/22/21 please schedule with Nurse Health Adviser   45 min for awv-i  in office appointments 30 min for awv-s & awv-i phone/virtual appointments

## 2022-02-23 NOTE — Telephone Encounter (Signed)
I spoke with patient to schedule her AWV with [facility].   She wanted me to let Dr Ethelene Hal know she will be calling soon to schedule an appointment.  She stated she has been taking care of her husband he is recovery from neck surgery

## 2022-03-13 ENCOUNTER — Other Ambulatory Visit: Payer: Self-pay | Admitting: Family Medicine

## 2022-03-13 DIAGNOSIS — I10 Essential (primary) hypertension: Secondary | ICD-10-CM

## 2022-03-13 DIAGNOSIS — E782 Mixed hyperlipidemia: Secondary | ICD-10-CM

## 2022-03-24 ENCOUNTER — Ambulatory Visit (INDEPENDENT_AMBULATORY_CARE_PROVIDER_SITE_OTHER): Payer: Medicare Other

## 2022-03-24 VITALS — Ht 62.0 in | Wt 180.0 lb

## 2022-03-24 DIAGNOSIS — Z0000 Encounter for general adult medical examination without abnormal findings: Secondary | ICD-10-CM | POA: Diagnosis not present

## 2022-03-24 NOTE — Progress Notes (Signed)
I connected with  Donna [name] on 03/24/22 by a audio enabled telemedicine application and verified that I am speaking with the correct person using two identifiers.  Patient Location: Home  Provider Location: Office/Clinic  I discussed the limitations of evaluation and management by telemedicine. The patient expressed understanding and agreed to proceed.  Subjective:   Donna [name] is a 72 y.o. female who presents for Medicare Annual (Subsequent) preventive examination.  Review of Systems     Cardiac Risk Factors include: advanced age (>63mn, >>17women);dyslipidemia;hypertension;obesity (BMI >30kg/m2)     Objective:    Today's Vitals   03/24/22 1041  Weight: 180 lb (81.6 kg)  Height: '5\' 2"'$  (1.575 m)   Body mass index is 32.92 kg/m.     03/24/2022   10:44 AM 03/22/2021    3:55 PM 02/17/2020    1:34 PM 09/21/2019    8:49 PM 05/21/2017   12:00 AM 05/18/2017    1:28 AM  Advanced Directives  Does Patient Have a Medical Advance Directive? Yes Yes No No No No  Type of AParamedicof AGolindaLiving will [place]Living will      Copy of [place] Chart? No - copy requested No - copy requested      Would patient like information on creating a medical advance directive?   Yes (MAU/Ambulatory/Procedural Areas - Information given) No - Patient declined No - Patient declined     Current Medications (verified) Outpatient Encounter Medications as of 03/24/2022  Medication Sig   amLODipine (NORVASC) 10 MG tablet TAKE 1 TABLET BY MOUTH DAILY   aspirin EC 81 MG tablet Take 81 mg by mouth every other day.   chlorthalidone (HYGROTON) 25 MG tablet TAKE 1 TABLET BY MOUTH DAILY   Cholecalciferol (VITAMIN D) 50 MCG (2000 UT) CAPS Take 2,000 Units by mouth daily.   Multiple Vitamin (MULTIVITAMIN WITH MINERALS) TABS tablet Take 1 tablet by mouth daily. One-A-Day 65+ Proactive   ondansetron (ZOFRAN) 4 MG tablet Take 1 tablet (4 mg  total) by mouth every 8 (eight) hours as needed for nausea or vomiting.   rosuvastatin (CRESTOR) 10 MG tablet TAKE 1 TABLET BY MOUTH DAILY   spironolactone (ALDACTONE) 25 MG tablet TAKE 1 TABLET BY MOUTH DAILY   No facility-administered encounter medications on file as of 03/24/2022.    Allergies (verified) Penicillins   History: Past Medical History:  Diagnosis Date   Essential hypertension 05/22/2017   Past Surgical History:  Procedure Laterality Date   CHOLECYSTECTOMY N/A 05/18/2017   Procedure: LAPAROSCOPIC CHOLECYSTECTOMY WITH INTRAOPERATIVE CHOLANGIOGRAM;  Surgeon: TJovita Kussmaul MD;  Location: [facility];  Service: General;  Laterality: N/A;   ENDOSCOPIC RETROGRADE CHOLANGIOPANCREATOGRAPHY (ERCP) WITH PROPOFOL N/A 05/21/2017   Procedure: ENDOSCOPIC RETROGRADE CHOLANGIOPANCREATOGRAPHY (ERCP) WITH PROPOFOL;  Surgeon: MClarene Essex MD;  Location[facility];  Service: Endoscopy;  Laterality: N/A;   Family History  Problem Relation Age of Onset   Cancer Father    Social History   Socioeconomic History   Marital status: Married    Spouse name: Not on file   Number of children: Not on file   Years of education: Not on file   Highest education level: Not on file  Occupational History   Not on file  Tobacco Use   Smoking status: Never   Smokeless tobacco: Never  Vaping Use   Vaping Use: Never used  Substance and Sexual Activity   Alcohol use: Never   Drug use: Never  Sexual activity: Yes  Other Topics Concern   Not on file  Social History Narrative   Not on file   Social Determinants of Health   Financial Resource Strain: Low Risk  (03/24/2022)   Overall Financial Resource Strain (CARDIA)    Difficulty of Paying Living Expenses: Not hard at all  Food Insecurity: No Food Insecurity (03/24/2022)   Hunger Vital Sign    Worried About Running Out of Food in the Last Year: Never true    Ran Out of Food in the Last Year: Never true  Transportation Needs: No Transportation  Needs (03/24/2022)   PRAPARE - Hydrologist (Medical): No    Lack of Transportation (Non-Medical): No  Physical Activity: Inactive (03/24/2022)   Exercise Vital Sign    Days of Exercise per Week: 0 days    Minutes of Exercise per Session: 0 min  Stress: No Stress Concern Present (03/24/2022)   [place]    Feeling of Stress : Only a little  Social Connections: Socially Integrated (03/22/2021)   Social Connection and Isolation Panel [NHANES]    Frequency of Communication with Friends and Family: Twice a week    Frequency of Social Gatherings with Friends and Family: Twice a week    Attends Religious Services: More than 4 times per year    Active Member of [employer] or Organizations: Yes    Attends Archivist Meetings: Never    Marital Status: Married    Tobacco Counseling Counseling given: Not Answered   Clinical Intake:  Pre-visit preparation completed: Yes  Pain : No/denies pain     Nutritional Status: BMI > 30  Obese Nutritional Risks: None Diabetes: No  How often do you need to have someone help you when you read instructions, pamphlets, or other written materials from your doctor or pharmacy?: 1 - Never  Diabetic? no  Interpreter Needed?: No  Information entered by :: NAllen LPN   Activities of Daily Living    03/24/2022   10:45 AM  In your present state of health, do you have any difficulty performing the following activities:  Hearing? 0  Vision? 0  Difficulty concentrating or making decisions? 0  Walking or climbing stairs? 1  Dressing or bathing? 0  Doing errands, shopping? 0  Preparing Food and eating ? N  Using the Toilet? N  In the past six months, have you accidently leaked urine? N  Do you have problems with loss of bowel control? N  Managing your Medications? N  Managing your Finances? N  Housekeeping or managing your Housekeeping? N     Patient Care Team: Libby Maw, MD as PCP - General (Family Medicine) Germaine Pomfret, [facility] as Pharmacist (Pharmacist)  Indicate any recent Medical Services you may have received from other than [facility] providers in the past year (date may be approximate).     Assessment:   This is a routine wellness examination for [place].  Hearing/Vision screen Vision Screening - Comments:: Regular eye exams, [facility]  Dietary issues and exercise activities discussed: Current Exercise Habits: The patient does not participate in regular exercise at present   Goals Addressed             This Visit's Progress    Patient Stated       03/24/2022, wants to exercise arms and legs       Depression Screen    03/24/2022   10:45 AM  04/29/2021   12:59 PM 03/22/2021    3:56 PM 03/22/2021    3:54 PM 02/17/2020    1:37 PM 12/02/2019    2:52 PM 05/20/2019    2:21 PM  PHQ 2/9 Scores  PHQ - 2 Score 0 0 0 0 0 2 1  PHQ- 9 Score      3 2    Fall Risk    03/24/2022   10:45 AM 04/29/2021   12:59 PM 03/22/2021    3:56 PM 02/17/2020    1:36 PM 12/02/2019    2:52 PM  [place] in the past year? 0 0 0 0 0  Number falls in past yr: 0 0 0 0   Injury with Fall? 0  0 0   Risk for fall due to : Medication side effect  No Fall Risks    Follow up Falls prevention discussed;Education provided;Falls evaluation completed  Falls evaluation completed Falls prevention discussed     FALL RISK PREVENTION PERTAINING TO THE HOME:  Any stairs in or around the home? Yes  If so, are there any without handrails? No  Home free of loose throw rugs in walkways, pet beds, electrical cords, etc? Yes  Adequate lighting in your home to reduce risk of falls? Yes   ASSISTIVE DEVICES UTILIZED TO PREVENT FALLS:  Life alert? No  Use of a cane, walker or w/c? No  Grab bars in the bathroom? Yes  Shower chair or bench in shower? Yes  Elevated toilet seat or a handicapped toilet? No   TIMED UP AND  GO:  Was the test performed? No .      Cognitive Function:        03/24/2022   10:46 AM  6CIT Screen  What Year? 0 points  What month? 0 points  What time? 0 points  Count back from 20 0 points  Months in reverse 0 points  Repeat phrase 0 points  Total Score 0 points    Immunizations Immunization History  Administered Date(s) Administered   Influenza, Quadrivalent, Recombinant, Inj, Pf 11/10/2017   Influenza-Unspecified 12/20/2019   PFIZER(Purple Top)SARS-COV-2 Vaccination 11/17/2019, 12/20/2019   Pneumococcal Conjugate-13 07/22/2014   Pneumococcal Polysaccharide-23 01/12/2016    TDAP status: Due, Education has been provided regarding the importance of this vaccine. Advised may receive this vaccine at local pharmacy or Health Dept. Aware to provide a copy of the vaccination record if obtained from local pharmacy or Health Dept. Verbalized acceptance and understanding.  Flu Vaccine status: Up to date  Pneumococcal vaccine status: Up to date  Covid-19 vaccine status: Completed vaccines  Qualifies for Shingles Vaccine? Yes   Zostavax completed No   Shingrix Completed?: No.    Education has been provided regarding the importance of this vaccine. Patient has been advised to call insurance company to determine out of pocket expense if they have not yet received this vaccine. Advised may also receive vaccine at local pharmacy or Health Dept. Verbalized acceptance and understanding.  Screening Tests Health Maintenance  Topic Date Due   Hepatitis C Screening  Never done   DTaP/Tdap/Td (1 - Tdap) Never done   Zoster Vaccines- Shingrix (1 of 2) Never done   DEXA SCAN  Never done   MAMMOGRAM  03/25/2023 (Originally 12/08/2014)   COLONOSCOPY (Pts 45-33yr Insurance coverage will need to be confirmed)  07/22/2022   Medicare Annual Wellness (AWV)  03/25/2023   Pneumonia Vaccine 72 Years old  Completed   HPV VACCINES  Aged Out  INFLUENZA VACCINE  Discontinued   COVID-19  Vaccine  Discontinued    Health Maintenance  Health Maintenance Due  Topic Date Due   Hepatitis C Screening  Never done   DTaP/Tdap/Td (1 - Tdap) Never done   Zoster Vaccines- Shingrix (1 of 2) Never done   DEXA SCAN  Never done    Colorectal cancer screening: Type of screening: Colonoscopy. Completed 07/21/2012. Repeat every 10 years  Mammogram status: decline  Bone Density status: Ordered today. Pt provided with contact info and advised to call to schedule appt.  Lung Cancer Screening: (Low Dose CT Chest recommended if Age 71-80 years, 30 pack-year currently smoking OR have quit w/in 15years.) does not qualify.   Lung Cancer Screening Referral: no  Additional Screening:  Hepatitis C Screening: does qualify;  Vision Screening: Recommended annual ophthalmology exams for early detection of glaucoma and other disorders of the eye. Is the patient up to date with their annual eye exam?  No  Who is the provider or what is the name of the office in which the patient attends annual eye exams? [facility] If pt is not established with a provider, would they like to be referred to a provider to establish care? No .   Dental Screening: Recommended annual dental exams for proper oral hygiene  Community Resource Referral / Chronic Care Management: CRR required this visit?  No   CCM required this visit?  No      Plan:     I have personally reviewed and noted the following in the patient's chart:   Medical and social history Use of alcohol, tobacco or illicit drugs  Current medications and supplements including opioid prescriptions. Patient is not currently taking opioid prescriptions. Functional ability and status Nutritional status Physical activity Advanced directives List of other physicians Hospitalizations, surgeries, and ER visits in previous 12 months Vitals Screenings to include cognitive, depression, and falls Referrals and appointments  In addition, I have  reviewed and discussed with patient certain preventive protocols, quality metrics, and best practice recommendations. A written personalized care plan for preventive services as well as general preventive health recommendations were provided to patient.     Kellie Simmering, LPN   [id number]   Nurse Notes: none  Due to this being a virtual visit, the after visit summary with patients personalized plan was offered to patient via mail or my-chart.  to pick up at office at next visit

## 2022-03-24 NOTE — Patient Instructions (Signed)
Donna [name] , Thank you for taking time to come for your Medicare Wellness Visit. I appreciate your ongoing commitment to your health goals. Please review the following plan we discussed and let me know if I can assist you in the future.   These are the goals we discussed:  Goals      Patient Stated     Maintain healthy lifestyle     Patient Stated     03/24/2022, wants to exercise arms and legs     Track and Manage My Blood Pressure-Hypertension     Timeframe:  Long-Range Goal Priority:  High Start Date: 12/07/2020                             Expected End Date: 12/07/2021                      Follow Up within 90 days   - check blood pressure weekly    Why is this important?   You won't feel high blood pressure, but it can still hurt your blood vessels.  High blood pressure can cause heart or kidney problems. It can also cause a stroke.  Making lifestyle changes like losing a little weight or eating less salt will help.  Checking your blood pressure at home and at different times of the day can help to control blood pressure.  If the doctor prescribes medicine remember to take it the way the doctor ordered.  Call the office if you cannot afford the medicine or if there are questions about it.     Notes:         This is a list of the screening recommended for you and due dates:  Health Maintenance  Topic Date Due   Hepatitis C Screening: USPSTF Recommendation to screen - Ages 55-79 yo.  Never done   DTaP/Tdap/Td vaccine (1 - Tdap) Never done   Zoster (Shingles) Vaccine (1 of 2) Never done   DEXA scan (bone density measurement)  Never done   Mammogram  03/25/2023*   Colon Cancer Screening  07/22/2022   Medicare Annual Wellness Visit  03/25/2023   Pneumonia Vaccine  Completed   HPV Vaccine  Aged Out   Flu Shot  Discontinued   COVID-19 Vaccine  Discontinued  *Topic was postponed. The date shown is not the original due date.    Advanced directives: Please bring a copy of  your POA (Power of Attorney) and/or Living Will to your next appointment.   Conditions/risks identified: none  Next appointment: Follow up in one year for your annual wellness visit    Preventive Care 65 Years and Older, Female Preventive care refers to lifestyle choices and visits with your health care provider that can promote health and wellness. What does preventive care include? A yearly physical exam. This is also called an annual well check. Dental exams once or twice a year. Routine eye exams. Ask your health care provider how often you should have your eyes checked. Personal lifestyle choices, including: Daily care of your teeth and gums. Regular physical activity. Eating a healthy diet. Avoiding tobacco and drug use. Limiting alcohol use. Practicing safe sex. Taking low-dose aspirin every day. Taking vitamin and mineral supplements as recommended by your health care provider. What happens during an annual well check? The services and screenings done by your health care provider during your annual well check will depend on your age, overall health,  lifestyle risk factors, and family history of disease. Counseling  Your health care provider may ask you questions about your: Alcohol use. Tobacco use. Drug use. Emotional well-being. Home and relationship well-being. Sexual activity. Eating habits. History of falls. Memory and ability to understand (cognition). Work and work Statistician. Reproductive health. Screening  You may have the following tests or measurements: Height, weight, and BMI. Blood pressure. Lipid and cholesterol levels. These may be checked every 5 years, or more frequently if you are over 98 years old. Skin check. Lung cancer screening. You may have this screening every year starting at age 50 if you have a 30-pack-year history of smoking and currently smoke or have quit within the past 15 years. Fecal occult blood test (FOBT) of the stool. You may  have this test every year starting at age 57. Flexible sigmoidoscopy or colonoscopy. You may have a sigmoidoscopy every 5 years or a colonoscopy every 10 years starting at age 37. Hepatitis C blood test. Hepatitis B blood test. Sexually transmitted disease (STD) testing. Diabetes screening. This is done by checking your blood sugar (glucose) after you have not eaten for a while (fasting). You may have this done every 1-3 years. Bone density scan. This is done to screen for osteoporosis. You may have this done starting at age 41. Mammogram. This may be done every 1-2 years. Talk to your health care provider about how often you should have regular mammograms. Talk with your health care provider about your test results, treatment options, and if necessary, the need for more tests. Vaccines  Your health care provider may recommend certain vaccines, such as: Influenza vaccine. This is recommended every year. Tetanus, diphtheria, and acellular pertussis (Tdap, Td) vaccine. You may need a Td booster every 10 years. Zoster vaccine. You may need this after age 29. Pneumococcal 13-valent conjugate (PCV13) vaccine. One dose is recommended after age 64. Pneumococcal polysaccharide (PPSV23) vaccine. One dose is recommended after age 13. Talk to your health care provider about which screenings and vaccines you need and how often you need them. This information is not intended to replace advice given to you by your health care provider. Make sure you discuss any questions you have with your health care provider. Document Released: 02/12/2015 Document Revised: 10/06/2015 Document Reviewed: 11/17/2014 Elsevier Interactive Patient Education  2017 [employer] Prevention in the Home Falls can cause injuries. They can happen to people of all ages. There are many things you can do to make your home safe and to help prevent falls. What can I do on the outside of my home? Regularly fix the edges of walkways  and driveways and fix any cracks. Remove anything that might make you trip as you walk through a door, such as a raised step or threshold. Trim any bushes or trees on the path to your home. Use bright outdoor lighting. Clear any walking paths of anything that might make someone trip, such as rocks or tools. Regularly check to see if handrails are loose or broken. Make sure that both sides of any steps have handrails. Any raised decks and porches should have guardrails on the edges. Have any leaves, snow, or ice cleared regularly. Use sand or salt on walking paths during winter. Clean up any spills in your garage right away. This includes oil or grease spills. What can I do in the bathroom? Use night lights. Install grab bars by the toilet and in the tub and shower. Do not use towel bars as  grab bars. Use non-skid mats or decals in the tub or shower. If you need to sit down in the shower, use a plastic, non-slip stool. Keep the floor dry. Clean up any water that spills on the floor as soon as it happens. Remove soap buildup in the tub or shower regularly. Attach bath mats securely with double-sided non-slip rug tape. Do not have throw rugs and other things on the floor that can make you trip. What can I do in the bedroom? Use night lights. Make sure that you have a light by your bed that is easy to reach. Do not use any sheets or blankets that are too big for your bed. They should not hang down onto the floor. Have a firm chair that has side arms. You can use this for support while you get dressed. Do not have throw rugs and other things on the floor that can make you trip. What can I do in the kitchen? Clean up any spills right away. Avoid walking on wet floors. Keep items that you use a lot in easy-to-reach places. If you need to reach something above you, use a strong step stool that has a grab bar. Keep electrical cords out of the way. Do not use floor polish or wax that makes  floors slippery. If you must use wax, use non-skid floor wax. Do not have throw rugs and other things on the floor that can make you trip. What can I do with my stairs? Do not leave any items on the stairs. Make sure that there are handrails on both sides of the stairs and use them. Fix handrails that are broken or loose. Make sure that handrails are as long as the stairways. Check any carpeting to make sure that it is firmly attached to the stairs. Fix any carpet that is loose or worn. Avoid having throw rugs at the top or bottom of the stairs. If you do have throw rugs, attach them to the floor with carpet tape. Make sure that you have a light switch at the top of the stairs and the bottom of the stairs. If you do not have them, ask someone to add them for you. What else can I do to help prevent falls? Wear shoes that: Do not have high heels. Have rubber bottoms. Are comfortable and fit you well. Are closed at the toe. Do not wear sandals. If you use a stepladder: Make sure that it is fully opened. Do not climb a closed stepladder. Make sure that both sides of the stepladder are locked into place. Ask someone to hold it for you, if possible. Clearly mark and make sure that you can see: Any grab bars or handrails. First and last steps. Where the edge of each step is. Use tools that help you move around (mobility aids) if they are needed. These include: Canes. Walkers. Scooters. Crutches. Turn on the lights when you go into a dark area. Replace any light bulbs as soon as they burn out. Set up your furniture so you have a clear path. Avoid moving your furniture around. If any of your floors are uneven, fix them. If there are any pets around you, be aware of where they are. Review your medicines with your doctor. Some medicines can make you feel dizzy. This can increase your chance of falling. Ask your doctor what other things that you can do to help prevent falls. This information is  not intended to replace advice given to you by  your health care provider. Make sure you discuss any questions you have with your health care provider. Document Released: 11/12/2008 Document Revised: 06/24/2015 Document Reviewed: 02/20/2014 Elsevier Interactive Patient Education  2017 [employer].

## 2022-04-09 ENCOUNTER — Other Ambulatory Visit: Payer: Self-pay | Admitting: Family Medicine

## 2022-04-09 DIAGNOSIS — I10 Essential (primary) hypertension: Secondary | ICD-10-CM

## 2022-05-09 ENCOUNTER — Telehealth: Payer: Self-pay | Admitting: Family Medicine

## 2022-05-09 ENCOUNTER — Other Ambulatory Visit: Payer: Self-pay | Admitting: Family Medicine

## 2022-05-09 DIAGNOSIS — I10 Essential (primary) hypertension: Secondary | ICD-10-CM

## 2022-05-09 DIAGNOSIS — E782 Mixed hyperlipidemia: Secondary | ICD-10-CM

## 2022-05-09 NOTE — Telephone Encounter (Signed)
Pt wanted me to document that she called to say she would be calling to make an appt. She is not feeling well.

## 2022-06-08 ENCOUNTER — Other Ambulatory Visit: Payer: Self-pay | Admitting: Family Medicine

## 2022-06-08 DIAGNOSIS — I10 Essential (primary) hypertension: Secondary | ICD-10-CM

## 2022-06-11 ENCOUNTER — Other Ambulatory Visit: Payer: Self-pay | Admitting: Family Medicine

## 2022-06-11 DIAGNOSIS — I10 Essential (primary) hypertension: Secondary | ICD-10-CM

## 2022-07-04 ENCOUNTER — Other Ambulatory Visit: Payer: Self-pay | Admitting: Family Medicine

## 2022-07-04 DIAGNOSIS — E782 Mixed hyperlipidemia: Secondary | ICD-10-CM

## 2022-07-04 DIAGNOSIS — I10 Essential (primary) hypertension: Secondary | ICD-10-CM

## 2022-07-06 NOTE — Telephone Encounter (Signed)
Spoke with Dr. Doreene Burke. Pt was due for f/u visit 09/2021. He has sent in 15-30 days of each medication but needs to see her in office before additional medications can be sent to pharmacy. LVM to notify pt. Ok to transfer to me.

## 2022-07-19 ENCOUNTER — Ambulatory Visit (INDEPENDENT_AMBULATORY_CARE_PROVIDER_SITE_OTHER): Payer: Medicare Other | Admitting: Family Medicine

## 2022-07-19 ENCOUNTER — Encounter: Payer: Self-pay | Admitting: Family Medicine

## 2022-07-19 VITALS — BP 136/70 | HR 74 | Temp 98.1°F | Ht 62.0 in | Wt 203.8 lb

## 2022-07-19 DIAGNOSIS — R7303 Prediabetes: Secondary | ICD-10-CM | POA: Diagnosis not present

## 2022-07-19 DIAGNOSIS — I10 Essential (primary) hypertension: Secondary | ICD-10-CM | POA: Diagnosis not present

## 2022-07-19 DIAGNOSIS — E782 Mixed hyperlipidemia: Secondary | ICD-10-CM

## 2022-07-19 DIAGNOSIS — Z780 Asymptomatic menopausal state: Secondary | ICD-10-CM | POA: Diagnosis not present

## 2022-07-19 DIAGNOSIS — R5383 Other fatigue: Secondary | ICD-10-CM | POA: Diagnosis not present

## 2022-07-19 DIAGNOSIS — E559 Vitamin D deficiency, unspecified: Secondary | ICD-10-CM

## 2022-07-19 DIAGNOSIS — Z91199 Patient's noncompliance with other medical treatment and regimen due to unspecified reason: Secondary | ICD-10-CM

## 2022-07-19 LAB — CBC
HCT: 42 % (ref 36.0–46.0)
Hemoglobin: 13.9 g/dL (ref 12.0–15.0)
MCHC: 33.1 g/dL (ref 30.0–36.0)
MCV: 88.5 fl (ref 78.0–100.0)
Platelets: 310 10*3/uL (ref 150.0–400.0)
RBC: 4.74 Mil/uL (ref 3.87–5.11)
RDW: 14.2 % (ref 11.5–15.5)
WBC: 8.1 10*3/uL (ref 4.0–10.5)

## 2022-07-19 LAB — COMPREHENSIVE METABOLIC PANEL
ALT: 12 U/L (ref 0–35)
AST: 14 U/L (ref 0–37)
Albumin: 4.5 g/dL (ref 3.5–5.2)
Alkaline Phosphatase: 68 U/L (ref 39–117)
BUN: 22 mg/dL (ref 6–23)
CO2: 29 mEq/L (ref 19–32)
Calcium: 9.5 mg/dL (ref 8.4–10.5)
Chloride: 99 mEq/L (ref 96–112)
Creatinine, Ser: 0.82 mg/dL (ref 0.40–1.20)
GFR: 70.74 mL/min (ref >60.00)
Glucose, Bld: 108 mg/dL — ABNORMAL HIGH (ref 70–99)
Potassium: 3.5 mEq/L (ref 3.5–5.1)
Sodium: 139 mEq/L (ref 135–145)
Total Bilirubin: 1.2 mg/dL (ref 0.2–1.2)
Total Protein: 7.7 g/dL (ref 6.0–8.3)

## 2022-07-19 LAB — LIPID PANEL
Cholesterol: 194 mg/dL (ref 0–200)
HDL: 50.3 mg/dL (ref >39.00)
NonHDL: 143.71
Total CHOL/HDL Ratio: 4
Triglycerides: 218 mg/dL — ABNORMAL HIGH (ref 0.0–149.0)
VLDL: 43.6 mg/dL — ABNORMAL HIGH (ref 0.0–40.0)

## 2022-07-19 LAB — HEMOGLOBIN A1C: Hgb A1c MFr Bld: 5.9 % (ref 4.6–6.5)

## 2022-07-19 LAB — VITAMIN D 25 HYDROXY (VIT D DEFICIENCY, FRACTURES): VITD: 45.48 ng/mL (ref 30.00–100.00)

## 2022-07-19 LAB — TSH: TSH: 5.26 u[IU]/mL (ref 0.35–5.50)

## 2022-07-19 LAB — LDL CHOLESTEROL, DIRECT: Direct LDL: 113 mg/dL

## 2022-07-19 NOTE — Progress Notes (Signed)
Established Patient Office Visit   Subjective:  Patient ID: Donna [name], female    DOB: July 08, 1950  Age: 72 y.o. MRN: [id number]  Chief Complaint  Patient presents with   Medical Management of Chronic Issues    Routine follow up on medications. No concerns patient fasting.     HPI Encounter Diagnoses  Name Primary?   Essential hypertension Yes   Vitamin D deficiency    Mixed hyperlipidemia    Prediabetes    Non-compliant patient    Other fatigue    Postmenopausal estrogen deficiency    Here for follow-up of above.  She has been lost to follow-up for over a year now.  Her husband has been ill.  She claims compliance with all of her medications.  She is fasting.  No regular exercise.   Review of Systems  Constitutional: Negative.   HENT: Negative.    Eyes:  Negative for blurred vision, discharge and redness.  Respiratory: Negative.    Cardiovascular: Negative.   Gastrointestinal:  Negative for abdominal pain.  Genitourinary: Negative.   Musculoskeletal: Negative.  Negative for myalgias.  Skin:  Negative for rash.  Neurological:  Negative for tingling, loss of consciousness and weakness.  Endo/Heme/Allergies:  Negative for polydipsia.     Current Outpatient Medications:    amLODipine (NORVASC) 10 MG tablet, TAKE 1 TABLET BY MOUTH DAILY, Disp: 15 tablet, Rfl: 0   aspirin EC 81 MG tablet, Take 81 mg by mouth every other day., Disp: , Rfl:    chlorthalidone (HYGROTON) 25 MG tablet, TAKE 1 TABLET BY MOUTH DAILY, Disp: 15 tablet, Rfl: 0   Cholecalciferol (VITAMIN D) 50 MCG (2000 UT) CAPS, Take 2,000 Units by mouth daily., Disp: , Rfl:    Multiple Vitamin (MULTIVITAMIN WITH MINERALS) TABS tablet, Take 1 tablet by mouth daily. One-A-Day 65+ Proactive, Disp: , Rfl:    rosuvastatin (CRESTOR) 10 MG tablet, TAKE 1 TABLET BY MOUTH DAILY, Disp: 30 tablet, Rfl: 1   spironolactone (ALDACTONE) 25 MG tablet, TAKE 1 TABLET BY MOUTH DAILY, Disp: 30 tablet, Rfl: 0   ondansetron (ZOFRAN) 4  MG tablet, Take 1 tablet (4 mg total) by mouth every 8 (eight) hours as needed for nausea or vomiting. (Patient not taking: Reported on 07/19/2022), Disp: 20 tablet, Rfl: 0   Objective:     BP 136/70 (BP Location: Left Arm, Patient Position: Sitting, Cuff Size: Large)   Pulse 74   Temp 98.1 F (36.7 C) (Temporal)   Ht 5\' 2"  (1.575 m)   Wt 203 lb 12.8 oz (92.4 kg)   SpO2 95%   BMI 37.28 kg/m    Physical Exam Constitutional:      General: She is not in acute distress.    Appearance: Normal appearance. She is not ill-appearing, toxic-appearing or diaphoretic.  HENT:     Head: Normocephalic and atraumatic.     Right Ear: External ear normal.     Left Ear: External ear normal.     Mouth/Throat:     Mouth: Mucous membranes are moist.     Pharynx: Oropharynx is clear. No oropharyngeal exudate or posterior oropharyngeal erythema.  Eyes:     General: No scleral icterus.       Right eye: No discharge.        Left eye: No discharge.     Extraocular Movements: Extraocular movements intact.     Conjunctiva/sclera: Conjunctivae normal.     Pupils: Pupils are equal, round, and reactive to light.  Cardiovascular:  Rate and Rhythm: Normal rate and regular rhythm.  Pulmonary:     Effort: Pulmonary effort is normal. No respiratory distress.     Breath sounds: Normal breath sounds. No wheezing, rhonchi or rales.  Abdominal:     General: Bowel sounds are normal.  Musculoskeletal:     Cervical back: No rigidity or tenderness.  Skin:    General: Skin is warm and dry.  Neurological:     Mental Status: She is alert and oriented to person, place, and time.  Psychiatric:        Mood and Affect: Mood normal.        Behavior: Behavior normal.      No results found for any visits on 07/19/22.    The 10-year ASCVD risk score (Arnett DK, et al., 2019) is: 19.2%    Assessment & Plan:   Essential hypertension -     CBC -     Comprehensive metabolic panel -     Urinalysis, Routine w  reflex microscopic  Vitamin D deficiency -     VITAMIN D 25 Hydroxy (Vit-D Deficiency, Fractures)  Mixed hyperlipidemia -     LDL cholesterol, direct -     Comprehensive metabolic panel -     Lipid panel  Prediabetes -     Comprehensive metabolic panel -     Hemoglobin A1c  Non-compliant patient  Other fatigue -     CBC -     TSH  Postmenopausal estrogen deficiency -     DG Bone Density; Future    Return in about 3 months (around 10/19/2022).  Continue all current medicines as directed.  Follow-up in 3 months.  We had a heart-to-heart talk that she was close to being discharged from care.  She has a recurrent pattern of noncompliance for follow-up.  Mliss Sax, MD

## 2022-07-20 ENCOUNTER — Other Ambulatory Visit: Payer: Self-pay | Admitting: Family Medicine

## 2022-07-20 DIAGNOSIS — I10 Essential (primary) hypertension: Secondary | ICD-10-CM

## 2022-07-21 ENCOUNTER — Telehealth: Payer: Self-pay | Admitting: Family Medicine

## 2022-07-21 NOTE — Telephone Encounter (Signed)
Pt sorry she missed her call/ please cb.

## 2022-07-21 NOTE — Telephone Encounter (Signed)
Pt would like a call about her lab results  °

## 2022-07-21 NOTE — Telephone Encounter (Signed)
Duplicate message. Called patient to go over labs, no answer LMTCB

## 2022-07-28 ENCOUNTER — Telehealth (HOSPITAL_BASED_OUTPATIENT_CLINIC_OR_DEPARTMENT_OTHER): Payer: Self-pay

## 2022-08-03 ENCOUNTER — Other Ambulatory Visit: Payer: Self-pay | Admitting: Family Medicine

## 2022-08-03 DIAGNOSIS — I10 Essential (primary) hypertension: Secondary | ICD-10-CM

## 2022-08-30 ENCOUNTER — Other Ambulatory Visit: Payer: Self-pay | Admitting: Family Medicine

## 2022-08-30 DIAGNOSIS — E782 Mixed hyperlipidemia: Secondary | ICD-10-CM

## 2022-09-14 ENCOUNTER — Encounter (INDEPENDENT_AMBULATORY_CARE_PROVIDER_SITE_OTHER): Payer: Self-pay

## 2022-10-15 ENCOUNTER — Other Ambulatory Visit: Payer: Self-pay | Admitting: Family Medicine

## 2022-10-15 DIAGNOSIS — I10 Essential (primary) hypertension: Secondary | ICD-10-CM

## 2022-10-16 ENCOUNTER — Ambulatory Visit (HOSPITAL_BASED_OUTPATIENT_CLINIC_OR_DEPARTMENT_OTHER)
Admission: RE | Admit: 2022-10-16 | Discharge: 2022-10-16 | Disposition: A | Discharge status: Home / Self Care | Payer: Medicare Other | Source: Ambulatory Visit | Attending: Family Medicine | Admitting: Family Medicine

## 2022-10-16 DIAGNOSIS — Z780 Asymptomatic menopausal state: Secondary | ICD-10-CM | POA: Insufficient documentation

## 2022-10-16 DIAGNOSIS — M810 Age-related osteoporosis without current pathological fracture: Secondary | ICD-10-CM | POA: Diagnosis not present

## 2022-10-17 ENCOUNTER — Telehealth: Payer: Self-pay | Admitting: Family Medicine

## 2022-10-17 NOTE — Telephone Encounter (Signed)
Pt had her amLODipine (NORVASC) 10 MG tablet [[id number]] sent to [facility]. They do not have the kind she needs. [facility]. This will be a one time thing don't change her pharmacy permanently.

## 2022-10-18 ENCOUNTER — Other Ambulatory Visit: Payer: Self-pay

## 2022-10-18 DIAGNOSIS — I10 Essential (primary) hypertension: Secondary | ICD-10-CM

## 2022-10-18 MED ORDER — AMLODIPINE BESYLATE 10 MG PO TABS
10.0000 mg | ORAL_TABLET | Freq: Every day | ORAL | 0 refills | Status: DC
Start: 2022-10-18 — End: 2023-01-10

## 2022-10-18 NOTE — Telephone Encounter (Signed)
[facility] Address: [address], [place] Phone: [phone]

## 2022-10-19 ENCOUNTER — Ambulatory Visit (INDEPENDENT_AMBULATORY_CARE_PROVIDER_SITE_OTHER): Payer: Medicare Other | Admitting: Family Medicine

## 2022-10-19 ENCOUNTER — Encounter: Payer: Self-pay | Admitting: Family Medicine

## 2022-10-19 ENCOUNTER — Other Ambulatory Visit: Payer: Self-pay

## 2022-10-19 VITALS — BP 138/78 | HR 69 | Temp 98.0°F | Ht 62.0 in | Wt 201.8 lb

## 2022-10-19 DIAGNOSIS — I10 Essential (primary) hypertension: Secondary | ICD-10-CM

## 2022-10-19 DIAGNOSIS — E559 Vitamin D deficiency, unspecified: Secondary | ICD-10-CM

## 2022-10-19 DIAGNOSIS — M810 Age-related osteoporosis without current pathological fracture: Secondary | ICD-10-CM

## 2022-10-19 DIAGNOSIS — R7303 Prediabetes: Secondary | ICD-10-CM | POA: Diagnosis not present

## 2022-10-19 LAB — HEMOGLOBIN A1C: Hgb A1c MFr Bld: 6.1 % (ref 4.6–6.5)

## 2022-10-19 MED ORDER — METFORMIN HCL ER 500 MG PO TB24
500.0000 mg | ORAL_TABLET | Freq: Every evening | ORAL | 1 refills | Status: DC
Start: 2022-10-19 — End: 2023-05-22

## 2022-10-19 MED ORDER — ALENDRONATE SODIUM 70 MG PO TABS: 70.0000 mg | ORAL_TABLET | ORAL | 11 refills | Status: DC

## 2022-10-19 NOTE — Telephone Encounter (Signed)
[facility] by Elam Dutch, CMA.

## 2022-10-19 NOTE — Progress Notes (Signed)
Established Patient Office Visit   Subjective:  Patient ID: Donna [name], female    DOB: 28-Jan-1951  Age: 72 y.o. MRN: [id number]  Chief Complaint  Patient presents with   Medical Management of Chronic Issues    Follow up.    HPI Encounter Diagnoses  Name Primary?   Prediabetes Yes   Essential hypertension    Vitamin D deficiency    Age-related osteoporosis without current pathological fracture    For follow-up of above.  Blood pressure at home is been running in the 120s to 130s over 70s to 80s.  Recent DEXA scan did show a T-score of -2.6 of the left femoral neck.  Vitamin D is well replaced on current therapy.  No family history of diabetes.  No history of gestational diabetes.   Review of Systems  Constitutional: Negative.   HENT: Negative.    Eyes:  Negative for blurred vision, discharge and redness.  Respiratory: Negative.    Cardiovascular: Negative.   Gastrointestinal:  Negative for abdominal pain.  Genitourinary: Negative.   Musculoskeletal: Negative.  Negative for myalgias.  Skin:  Negative for rash.  Neurological:  Negative for tingling, loss of consciousness and weakness.  Endo/Heme/Allergies:  Negative for polydipsia.     Current Outpatient Medications:    alendronate (FOSAMAX) 70 MG tablet, Take 1 tablet (70 mg total) by mouth every 7 (seven) days. Take with a full glass of water on an empty stomach., Disp: 4 tablet, Rfl: 11   amLODipine (NORVASC) 10 MG tablet, Take 1 tablet (10 mg total) by mouth daily., Disp: 90 tablet, Rfl: 0   aspirin EC 81 MG tablet, Take 81 mg by mouth every other day., Disp: , Rfl:    chlorthalidone (HYGROTON) 25 MG tablet, TAKE 1 TABLET BY MOUTH DAILY, Disp: 90 tablet, Rfl: 0   Cholecalciferol (VITAMIN D) 50 MCG (2000 UT) CAPS, Take 2,000 Units by mouth daily., Disp: , Rfl:    metFORMIN (GLUCOPHAGE-XR) 500 MG 24 hr tablet, Take 1 tablet (500 mg total) by mouth at bedtime., Disp: 90 tablet, Rfl: 1   Multiple Vitamin (MULTIVITAMIN WITH  MINERALS) TABS tablet, Take 1 tablet by mouth daily. One-A-Day 65+ Proactive, Disp: , Rfl:    rosuvastatin (CRESTOR) 10 MG tablet, TAKE 1 TABLET BY MOUTH DAILY, Disp: 30 tablet, Rfl: 1   spironolactone (ALDACTONE) 25 MG tablet, TAKE 1 TABLET BY MOUTH DAILY, Disp: 30 tablet, Rfl: 2   ondansetron (ZOFRAN) 4 MG tablet, Take 1 tablet (4 mg total) by mouth every 8 (eight) hours as needed for nausea or vomiting. (Patient not taking: Reported on 07/19/2022), Disp: 20 tablet, Rfl: 0   Objective:     BP 138/78   Pulse 69   Temp 98 F (36.7 C)   Ht 5\' 2"  (1.575 m)   Wt 201 lb 12.8 oz (91.5 kg)   SpO2 96%   BMI 36.91 kg/m    Physical Exam Constitutional:      General: She is not in acute distress.    Appearance: Normal appearance. She is not ill-appearing, toxic-appearing or diaphoretic.  HENT:     Head: Normocephalic and atraumatic.     Right Ear: External ear normal.     Left Ear: External ear normal.  Eyes:     General: No scleral icterus.       Right eye: No discharge.        Left eye: No discharge.     Extraocular Movements: Extraocular movements intact.     Conjunctiva/sclera: Conjunctivae  normal.  Pulmonary:     Effort: Pulmonary effort is normal. No respiratory distress.  Skin:    General: Skin is warm and dry.  Neurological:     Mental Status: She is alert and oriented to person, place, and time.  Psychiatric:        Mood and Affect: Mood normal.        Behavior: Behavior normal.      No results found for any visits on 10/19/22.    The 10-year ASCVD risk score (Arnett DK, et al., 2019) is: 21.7%    Assessment & Plan:   Prediabetes -     Basic metabolic panel -     Hemoglobin A1c -     metFORMIN HCl ER; Take 1 tablet (500 mg total) by mouth at bedtime.  Dispense: 90 tablet; Refill: 1  Essential hypertension -     Basic metabolic panel  Vitamin D deficiency  Age-related osteoporosis without current pathological fracture -     Alendronate Sodium; Take 1  tablet (70 mg total) by mouth every 7 (seven) days. Take with a full glass of water on an empty stomach.  Dispense: 4 tablet; Refill: 11    Return in about 3 months (around 01/18/2023), or if symptoms worsen or fail to improve, for 1200 mg of calcium daily.  Continue vitamin D 2000 IUs daily and add 1200 mg of calcium daily.  Information was given on osteoporosis and Fosamax.  Start Glucophage.  Discussed side effects of cramping and loose stooling.  Advised that these are transient for most people.  Mliss Sax, MD

## 2022-10-20 LAB — BASIC METABOLIC PANEL
BUN: 20 mg/dL (ref 6–23)
CO2: 27 mEq/L (ref 19–32)
Calcium: 10 mg/dL (ref 8.4–10.5)
Chloride: 96 mEq/L (ref 96–112)
Creatinine, Ser: 0.88 mg/dL (ref 0.40–1.20)
GFR: 64.88 mL/min (ref >60.00)
Glucose, Bld: 94 mg/dL (ref 70–99)
Potassium: 3.3 mEq/L — ABNORMAL LOW (ref 3.5–5.1)
Sodium: 135 mEq/L (ref 135–145)

## 2022-10-26 ENCOUNTER — Other Ambulatory Visit: Payer: Self-pay | Admitting: Family Medicine

## 2022-10-26 DIAGNOSIS — I10 Essential (primary) hypertension: Secondary | ICD-10-CM

## 2022-10-26 DIAGNOSIS — E782 Mixed hyperlipidemia: Secondary | ICD-10-CM

## 2022-11-03 ENCOUNTER — Other Ambulatory Visit: Payer: Self-pay | Admitting: Family Medicine

## 2022-11-03 DIAGNOSIS — R195 Other fecal abnormalities: Secondary | ICD-10-CM

## 2022-11-03 DIAGNOSIS — Z1211 Encounter for screening for malignant neoplasm of colon: Secondary | ICD-10-CM

## 2022-11-03 DIAGNOSIS — Z1212 Encounter for screening for malignant neoplasm of rectum: Secondary | ICD-10-CM

## 2022-11-20 ENCOUNTER — Telehealth: Payer: Self-pay | Admitting: Family Medicine

## 2022-11-20 NOTE — Telephone Encounter (Signed)
Pt wanted to let you know the cologard arrived at her home but she is out of town so she will not be able to take the test.

## 2022-11-24 ENCOUNTER — Telehealth: Payer: Self-pay

## 2022-11-24 NOTE — Patient Outreach (Signed)
[facility] Assistant attempted to call patient on today regarding preventative mammogram screening. No answer from patient after multiple rings. Assistant left confidential voicemail for patient to return call.  Will call back patient back for final attempt.  Baruch Gouty [facility] Assistant [facility] [phone]

## 2022-12-19 ENCOUNTER — Other Ambulatory Visit: Payer: Self-pay | Admitting: Family Medicine

## 2022-12-19 DIAGNOSIS — E782 Mixed hyperlipidemia: Secondary | ICD-10-CM

## 2023-01-10 ENCOUNTER — Other Ambulatory Visit: Payer: Self-pay | Admitting: Family Medicine

## 2023-01-10 DIAGNOSIS — I10 Essential (primary) hypertension: Secondary | ICD-10-CM

## 2023-01-12 ENCOUNTER — Other Ambulatory Visit: Payer: Self-pay | Admitting: Family Medicine

## 2023-01-12 DIAGNOSIS — I10 Essential (primary) hypertension: Secondary | ICD-10-CM

## 2023-01-18 ENCOUNTER — Other Ambulatory Visit: Payer: Self-pay | Admitting: Family Medicine

## 2023-01-18 DIAGNOSIS — I10 Essential (primary) hypertension: Secondary | ICD-10-CM

## 2023-01-26 ENCOUNTER — Other Ambulatory Visit: Payer: Self-pay | Admitting: Family Medicine

## 2023-01-26 DIAGNOSIS — I10 Essential (primary) hypertension: Secondary | ICD-10-CM

## 2023-01-26 MED ORDER — AMLODIPINE BESYLATE 10 MG PO TABS: 10.0000 mg | ORAL_TABLET | Freq: Every day | ORAL | 0 refills | Status: DC

## 2023-01-26 NOTE — Telephone Encounter (Signed)
Rx sent to the pharmacy. Dm/cma  

## 2023-01-26 NOTE — Telephone Encounter (Signed)
Copied from CRM [id number]. Topic: Clinical - Medication Refill >> Jan 26, 2023 12:21 PM Isabell A wrote: Most Recent Primary Care Visit:  Provider: Mliss Sax  Department: [facility]  Visit Type: OFFICE VISIT  Date: 10/19/2022  Medication: amLODipine (NORVASC) 10 MG tablet  Has the patient contacted their pharmacy? Yes (Agent: If no, request that the patient contact the pharmacy for the refill. If patient does not wish to contact the pharmacy document the reason why and proceed with request.) (Agent: If yes, when and what did the pharmacy advise?)  Is this the correct pharmacy for this prescription? Yes If no, delete pharmacy and type the correct one.  This is the patient's preferred pharmacy:   [id number] [place], [place] - [address] [place] [address] Phone: [phone] Fax: [phone]  Has the prescription been filled recently? Yes  Is the patient out of the medication? No  Has the patient been seen for an appointment in the last year OR does the patient have an upcoming appointment? Yes  Can we respond through MyChart? Yes  Agent: Please be advised that Rx refills may take up to 3 business days. We ask that you follow-up with your pharmacy.      Patient states the number on the pill should say L32 on Amlodopine.

## 2023-02-11 DIAGNOSIS — Z1211 Encounter for screening for malignant neoplasm of colon: Secondary | ICD-10-CM | POA: Diagnosis not present

## 2023-02-11 DIAGNOSIS — Z1212 Encounter for screening for malignant neoplasm of rectum: Secondary | ICD-10-CM | POA: Diagnosis not present

## 2023-02-13 ENCOUNTER — Ambulatory Visit (INDEPENDENT_AMBULATORY_CARE_PROVIDER_SITE_OTHER): Payer: Medicare Other | Admitting: Family Medicine

## 2023-02-13 ENCOUNTER — Encounter: Payer: Self-pay | Admitting: Family Medicine

## 2023-02-13 VITALS — BP 134/78 | HR 88 | Temp 98.0°F | Ht 62.0 in | Wt 201.0 lb

## 2023-02-13 DIAGNOSIS — E876 Hypokalemia: Secondary | ICD-10-CM | POA: Diagnosis not present

## 2023-02-13 DIAGNOSIS — R7303 Prediabetes: Secondary | ICD-10-CM

## 2023-02-13 DIAGNOSIS — I10 Essential (primary) hypertension: Secondary | ICD-10-CM | POA: Diagnosis not present

## 2023-02-13 DIAGNOSIS — M810 Age-related osteoporosis without current pathological fracture: Secondary | ICD-10-CM

## 2023-02-13 DIAGNOSIS — E782 Mixed hyperlipidemia: Secondary | ICD-10-CM

## 2023-02-13 NOTE — Progress Notes (Addendum)
 Established Patient Office Visit   Subjective:  Patient ID: Donna [name], female    DOB: 1950-08-05  Age: 73 y.o. MRN: [id number]  Chief Complaint  Patient presents with   Medical Management of Chronic Issues    3 month follow up. Pt is not fasting.     HPI Encounter Diagnoses  Name Primary?   Age-related osteoporosis without current pathological fracture Yes   Prediabetes    Mixed hyperlipidemia    Essential hypertension    Hypokalemia    For follow-up of above.  Doing well.  Compliant with her medications.  Blood pressures at home running in the 130s over 70s to 80s.  She is having some loose stool with metformin  but so far is tolerable.  She would like to continue it.  She is not tolerating the alendronate .  It does cause nausea sometimes vomiting as well.  Continues with vitamin D  supplementation.  Her beloved husband Prentice has been battling bladder cancer that is in remission but was recently diagnosed with prostate cancer. About to undergo space core injections.     Review of Systems  Constitutional: Negative.   HENT: Negative.    Eyes:  Negative for blurred vision, discharge and redness.  Respiratory: Negative.    Cardiovascular: Negative.   Gastrointestinal:  Negative for abdominal pain.  Genitourinary: Negative.   Musculoskeletal: Negative.  Negative for myalgias.  Skin:  Negative for rash.  Neurological:  Negative for tingling, loss of consciousness and weakness.  Endo/Heme/Allergies:  Negative for polydipsia.     Current Outpatient Medications:    alendronate  (FOSAMAX ) 70 MG tablet, Take 1 tablet (70 mg total) by mouth every 7 (seven) days. Take with a full glass of water on an empty stomach., Disp: 4 tablet, Rfl: 11   amLODipine  (NORVASC ) 10 MG tablet, Take 1 tablet (10 mg total) by mouth daily., Disp: 90 tablet, Rfl: 0   aspirin EC 81 MG tablet, Take 81 mg by mouth every other day., Disp: , Rfl:    chlorthalidone  (HYGROTON ) 25 MG tablet, TAKE ONE (1) TABLET BY  MOUTH EACH DAY, Disp: 30 tablet, Rfl: 0   Cholecalciferol (VITAMIN D ) 50 MCG (2000 UT) CAPS, Take 2,000 Units by mouth daily., Disp: , Rfl:    metFORMIN  (GLUCOPHAGE -XR) 500 MG 24 hr tablet, Take 1 tablet (500 mg total) by mouth at bedtime., Disp: 90 tablet, Rfl: 1   Multiple Vitamin (MULTIVITAMIN WITH MINERALS) TABS tablet, Take 1 tablet by mouth daily. One-A-Day 65+ Proactive, Disp: , Rfl:    potassium chloride  SA (KLOR-CON  M) 20 MEQ tablet, Take 1 tablet (20 mEq total) by mouth daily., Disp: 30 tablet, Rfl: 3   rosuvastatin  (CRESTOR ) 20 MG tablet, Take 1 tablet (20 mg total) by mouth daily., Disp: 90 tablet, Rfl: 3   spironolactone  (ALDACTONE ) 25 MG tablet, TAKE 1 TABLET BY MOUTH DAILY, Disp: 30 tablet, Rfl: 2   Objective:     BP 134/78   Pulse 88   Temp 98 F (36.7 C)   Ht 5' 2 (1.575 m)   Wt 201 lb (91.2 kg)   SpO2 98%   BMI 36.76 kg/m  BP Readings from Last 3 Encounters:  02/13/23 134/78  10/19/22 138/78  07/19/22 136/70   Wt Readings from Last 3 Encounters:  02/13/23 201 lb (91.2 kg)  10/19/22 201 lb 12.8 oz (91.5 kg)  07/19/22 203 lb 12.8 oz (92.4 kg)      Physical Exam Constitutional:      General: She is not in  acute distress.    Appearance: Normal appearance. She is not ill-appearing, toxic-appearing or diaphoretic.  HENT:     Head: Normocephalic and atraumatic.     Right Ear: External ear normal.     Left Ear: External ear normal.  Eyes:     General: No scleral icterus.       Right eye: No discharge.        Left eye: No discharge.     Extraocular Movements: Extraocular movements intact.     Conjunctiva/sclera: Conjunctivae normal.  Pulmonary:     Effort: Pulmonary effort is normal. No respiratory distress.  Skin:    General: Skin is warm and dry.  Neurological:     Mental Status: She is alert and oriented to person, place, and time.  Psychiatric:        Mood and Affect: Mood normal.        Behavior: Behavior normal.      Results for orders placed  or performed in visit on 02/13/23  Basic metabolic panel  Result Value Ref Range   Sodium 136 135 - 145 mEq/L   Potassium 3.1 (L) 3.5 - 5.1 mEq/L   Chloride 96 96 - 112 mEq/L   CO2 27 19 - 32 mEq/L   Glucose, Bld 92 70 - 99 mg/dL   BUN 21 6 - 23 mg/dL   Creatinine, Ser 9.13 0.40 - 1.20 mg/dL   GFR 33.45 >39.99 mL/min   Calcium  9.9 8.4 - 10.5 mg/dL  Hemoglobin J8r  Result Value Ref Range   Hgb A1c MFr Bld 6.2 4.6 - 6.5 %  LDL cholesterol, direct  Result Value Ref Range   Direct LDL 83.0 mg/dL      The 89-bzjm ASCVD risk score (Arnett DK, et al., 2019) is: 20.6%    Assessment & Plan:   Age-related osteoporosis without current pathological fracture -     Ambulatory referral to Endocrinology  Prediabetes -     Basic metabolic panel -     Hemoglobin A1c  Mixed hyperlipidemia -     LDL cholesterol, direct -     Rosuvastatin  Calcium ; Take 1 tablet (20 mg total) by mouth daily.  Dispense: 90 tablet; Refill: 3  Essential hypertension -     Basic metabolic panel  Hypokalemia -     Potassium Chloride  Crys ER; Take 1 tablet (20 mEq total) by mouth daily.  Dispense: 30 tablet; Refill: 3    Return in about 8 weeks (around 04/10/2023), or if symptoms worsen or fail to improve.  Continue all medications.  Endocrinology referral for alternative treatment of osteoporosis.  Elsie Sim Lent, MD

## 2023-02-14 ENCOUNTER — Other Ambulatory Visit: Payer: Self-pay | Admitting: Family Medicine

## 2023-02-14 DIAGNOSIS — E782 Mixed hyperlipidemia: Secondary | ICD-10-CM

## 2023-02-14 LAB — LDL CHOLESTEROL, DIRECT: Direct LDL: 83 mg/dL

## 2023-02-14 LAB — BASIC METABOLIC PANEL
BUN: 21 mg/dL (ref 6–23)
CO2: 27 meq/L (ref 19–32)
Calcium: 9.9 mg/dL (ref 8.4–10.5)
Chloride: 96 meq/L (ref 96–112)
Creatinine, Ser: 0.86 mg/dL (ref 0.40–1.20)
GFR: 66.54 mL/min (ref >60.00)
Glucose, Bld: 92 mg/dL (ref 70–99)
Potassium: 3.1 meq/L — ABNORMAL LOW (ref 3.5–5.1)
Sodium: 136 meq/L (ref 135–145)

## 2023-02-14 LAB — HEMOGLOBIN A1C: Hgb A1c MFr Bld: 6.2 % (ref 4.6–6.5)

## 2023-02-15 MED ORDER — POTASSIUM CHLORIDE CRYS ER 20 MEQ PO TBCR: 20.0000 meq | EXTENDED_RELEASE_TABLET | Freq: Every day | ORAL | 3 refills | Status: DC

## 2023-02-15 MED ORDER — ROSUVASTATIN CALCIUM 20 MG PO TABS: 20.0000 mg | ORAL_TABLET | Freq: Every day | ORAL | 3 refills | Status: DC

## 2023-02-15 NOTE — Telephone Encounter (Signed)
Pt is wanting a cb concerning this issue.

## 2023-02-15 NOTE — Addendum Note (Signed)
Addended by: Andrez Grime on: 02/15/2023 12:52 PM   Modules accepted: Orders

## 2023-02-24 LAB — COLOGUARD: COLOGUARD: POSITIVE — AB

## 2023-02-26 NOTE — Progress Notes (Signed)
Cologuard test was positive.  There is evidence now that colon cancer may be present in your body.  It is extremely important that you follow-up with the gastroenterologist to schedule a colonoscopy.  The potassium is also low.  Please take the potassium tablets and follow-up with me as planned in 8 weeks.

## 2023-02-26 NOTE — Addendum Note (Signed)
Addended by: Andrez Grime on: 02/26/2023 08:15 AM   Modules accepted: Orders

## 2023-02-27 NOTE — Progress Notes (Signed)
Results given. Pt expresses understanding. [facility] phone number and address given. Pt states she cannot go right now as she has caught the flu from family. I advised she call and get on the schedule as it not likely they will be able to see her any time soon. No further questions or concerns.

## 2023-03-12 ENCOUNTER — Encounter: Payer: Self-pay | Admitting: Gastroenterology

## 2023-03-13 ENCOUNTER — Other Ambulatory Visit: Payer: Self-pay | Admitting: Family Medicine

## 2023-03-13 DIAGNOSIS — I10 Essential (primary) hypertension: Secondary | ICD-10-CM

## 2023-03-13 NOTE — Telephone Encounter (Signed)
Copied from CRM [id number]. Topic: Clinical - Medication Refill >> Mar 13, 2023 11:23 AM Arm[place] J wrote: Most Recent Primary Care Visit:  Provider: Mliss Sax  Department: [facility]  Visit Type: OFFICE VISIT  Date: 02/13/2023  Medication: chlorthalidone (HYGROTON) 25 MG tablet  Has the patient contacted their pharmacy? Yes (Agent: If no, request that the patient contact the pharmacy for the refill. If patient does not wish to contact the pharmacy document the reason why and proceed with request.) (Agent: If yes, when and what did the pharmacy advise?)  Is this the correct pharmacy for this prescription? Yes If no, delete pharmacy and type the correct one.  This is the patient's preferred pharmacy:  [facility] [id number] - [place], [place] - [address] [place] [address] Phone: [phone] Fax: [phone]  [id number] - [address], [place] - [address] [place] [address] Phone: [phone] Fax: [phone]  [facility] - [place],  - [address] [place] [address] Phone: [phone] Fax: [phone]   Has the prescription been filled recently? No  Is the patient out of the medication? No  Has the patient been seen for an appointment in the last year OR does the patient have an upcoming appointment? Yes  Can we respond through MyChart? Yes  Agent: Please be advised that Rx refills may take up to 3 business days. We ask that you follow-up with your pharmacy.

## 2023-03-21 ENCOUNTER — Ambulatory Visit (AMBULATORY_SURGERY_CENTER): Payer: Medicare Other

## 2023-03-21 VITALS — Ht 62.0 in | Wt 190.0 lb

## 2023-03-21 DIAGNOSIS — Z1211 Encounter for screening for malignant neoplasm of colon: Secondary | ICD-10-CM

## 2023-03-21 MED ORDER — SUFLAVE 178.7 G PO SOLR: 1.0000 | Freq: Once | ORAL | 0 refills | Status: AC

## 2023-03-21 NOTE — Progress Notes (Addendum)
Pt's name and DOB verified at the beginning of the pre-visit wit 2 identifiers  Pt denies any difficulty with ambulating,sitting, laying down or rolling side to side  Pt has no issues with ambulation   Pt has no issues moving head neck or swallowing  No egg or soy allergy known to patient   No issues known to pt with past sedation with any surgeries or procedures  Pt denies having issues being intubated  No FH of Malignant Hyperthermia  Pt is not on diet pills or shots  Pt is not on home 02   Pt is not on blood thinners   Pt denies issues with constipation   Pt is not on dialysis  Pt denise any abnormal heart rhythms   Pt denies any upcoming cardiac testing    Chart not reviewed by CRNA prior to PV  Visit by phone  Pt states weight is 190 lb   IInstructions reviewed. Pt given [facility] main # and MD on call # prior to instructions.  Pt states understanding of instructions. Instructed to review again prior to procedure. Pt states they will.   Informed pt that they will receive a text or  call from [facility] regarding there prep med.

## 2023-03-29 ENCOUNTER — Telehealth: Payer: Self-pay

## 2023-03-29 NOTE — Telephone Encounter (Signed)
 Copied from CRM [id number]. Topic: General - Other >> Mar 29, 2023 10:33 AM Gurney Maxin H wrote: Reason for CRM: Patient called in stating she wanted to speak with Dr.Krarmers nurse regarding medication, agent asked patient did she need a refill and patient declined stating she just wanted to speak with one of the nurses at the office. Please reach out to patient, thanks. Aiko [phone]  Pt have concerns with metformin 500mg , she is experiencing diarrhea and stated dosage may be to high. Pt is requesting RX change to only take 1/2 tab instead of whole daily LOV was 02/13/2023; she also stated that concerns were mentioned in the past; but was never addressed or changed. Please advise.

## 2023-03-29 NOTE — Telephone Encounter (Signed)
 Spoke to Pt and relied message below. Pt verbalized understanding and a thank you; she is try 1/2 tab.

## 2023-03-30 ENCOUNTER — Encounter: Payer: Self-pay | Admitting: Gastroenterology

## 2023-04-02 ENCOUNTER — Telehealth: Payer: Self-pay | Admitting: Gastroenterology

## 2023-04-02 NOTE — Telephone Encounter (Signed)
 Good Morning Dr Adela Lank   Patient cancelled procedure for 3/5 due to her having the flu. Will call back at a later time to reschedule.

## 2023-04-02 NOTE — Telephone Encounter (Signed)
Sorry to hear this, thanks for letting me know

## 2023-04-04 ENCOUNTER — Encounter: Payer: Medicare Other | Admitting: Gastroenterology

## 2023-04-16 ENCOUNTER — Other Ambulatory Visit: Payer: Self-pay | Admitting: Family Medicine

## 2023-04-16 DIAGNOSIS — I10 Essential (primary) hypertension: Secondary | ICD-10-CM

## 2023-04-16 MED ORDER — AMLODIPINE BESYLATE 10 MG PO TABS
10.0000 mg | ORAL_TABLET | Freq: Every day | ORAL | 0 refills | Status: DC
Start: 2023-04-16 — End: 2023-07-27

## 2023-04-16 MED ORDER — CHLORTHALIDONE 25 MG PO TABS
25.0000 mg | ORAL_TABLET | Freq: Every day | ORAL | 0 refills | Status: DC
Start: 2023-04-16 — End: 2023-05-16

## 2023-05-16 ENCOUNTER — Other Ambulatory Visit: Payer: Self-pay | Admitting: Family Medicine

## 2023-05-16 DIAGNOSIS — I10 Essential (primary) hypertension: Secondary | ICD-10-CM

## 2023-05-16 MED ORDER — CHLORTHALIDONE 25 MG PO TABS: 25.0000 mg | ORAL_TABLET | Freq: Every day | ORAL | 0 refills | Status: DC

## 2023-05-21 ENCOUNTER — Other Ambulatory Visit: Payer: Medicare Other

## 2023-05-22 ENCOUNTER — Other Ambulatory Visit: Payer: Self-pay

## 2023-05-22 DIAGNOSIS — R7303 Prediabetes: Secondary | ICD-10-CM

## 2023-05-22 MED ORDER — METFORMIN HCL ER 500 MG PO TB24: 500.0000 mg | ORAL_TABLET | Freq: Every evening | ORAL | 1 refills | Status: AC

## 2023-05-22 NOTE — Telephone Encounter (Signed)
 Copied from CRM [id number]. Topic: General - Other >> May 22, 2023 11:13 AM Albertha Alosa wrote: Reason for CRM: Patient called in stating she would like for the nurse to give her a callback  Regarding some questions with medications

## 2023-05-24 ENCOUNTER — Ambulatory Visit: Payer: Medicare Other | Admitting: "Endocrinology

## 2023-06-19 ENCOUNTER — Other Ambulatory Visit: Payer: Self-pay | Admitting: Family Medicine

## 2023-06-19 DIAGNOSIS — I10 Essential (primary) hypertension: Secondary | ICD-10-CM

## 2023-07-03 DIAGNOSIS — H2513 Age-related nuclear cataract, bilateral: Secondary | ICD-10-CM | POA: Diagnosis not present

## 2023-07-12 ENCOUNTER — Other Ambulatory Visit: Payer: Self-pay | Admitting: Family Medicine

## 2023-07-12 DIAGNOSIS — I10 Essential (primary) hypertension: Secondary | ICD-10-CM

## 2023-07-19 ENCOUNTER — Other Ambulatory Visit: Payer: Self-pay | Admitting: Family Medicine

## 2023-07-19 DIAGNOSIS — I10 Essential (primary) hypertension: Secondary | ICD-10-CM

## 2023-07-27 ENCOUNTER — Encounter: Payer: Self-pay | Admitting: "Endocrinology

## 2023-07-27 ENCOUNTER — Other Ambulatory Visit: Payer: Self-pay

## 2023-07-27 ENCOUNTER — Ambulatory Visit: Admitting: "Endocrinology

## 2023-07-27 ENCOUNTER — Telehealth: Payer: Self-pay | Admitting: Family Medicine

## 2023-07-27 VITALS — BP 130/80 | HR 85 | Ht 62.0 in | Wt 189.0 lb

## 2023-07-27 DIAGNOSIS — M810 Age-related osteoporosis without current pathological fracture: Secondary | ICD-10-CM | POA: Diagnosis not present

## 2023-07-27 DIAGNOSIS — I10 Essential (primary) hypertension: Secondary | ICD-10-CM

## 2023-07-27 MED ORDER — AMLODIPINE BESYLATE 10 MG PO TABS: 10.0000 mg | ORAL_TABLET | Freq: Every day | ORAL | 0 refills | Status: DC

## 2023-07-27 NOTE — Patient Instructions (Signed)
 Recommend to use calcium 600 mg twice daily and vitamin D 2000 units OTC supplements.

## 2023-07-27 NOTE — Progress Notes (Signed)
 [facility] Note Donna Birmingham, MD    Referring Provider: Berneta Donna [name],* Primary Care Provider: Berneta Donna Sayre, MD No chief complaint on file.   Assessment & Plan  Diagnoses and all orders for this visit:  Age-related osteoporosis without current pathological fracture -     VITAMIN D  25 Hydroxy (Vit-D Deficiency, Fractures) -     Renal function panel    Osteoporosis Likely secondary cause from age.  BMD results suggest: Femur Neck Left with a T-score of -2.6. Recommend to use calcium  600 mg twice daily and vitamin D  2000 units OTC supplements.  Discussed weight bearing exercise options and dietary supplements.   Couldn't take fosamax  due to nausea, vomiting, head ache, diarrhea: was on higher dose on metformin  then but now is on low dose with no GI S/E. Recommend labs now, if within normal limits will recommend to retry fosamax  per patient agreement, if not tolerated will switch to reclast.   Educated on risks and side effects of fosamax  including but not limited to esophagitis/GERD, atypical femoral fractures and osteonecrosis of the jaw. Patient has fosamax  at home. Advised to take medication first thing in the morning with plenty of water and stay upright for 30 minutes after taking the medication.  Advised fall precautions, adequate dairy in diet and exercises (aerobic, balancing and weight bearing) as tolerated.   Return in about 3 months (around 10/27/2023) for visit, labs today.  I have reviewed current medications, nurse's notes, allergies, vital signs, past medical and surgical history, family medical history, and social history for this encounter. Counseled patient on symptoms, examination findings, lab findings, imaging results, treatment decisions and monitoring and prognosis. The patient understood the recommendations and agrees with the treatment plan. All questions regarding treatment plan were fully answered.   Donna Birmingham, MD    07/27/23   History of Present Illness Donna [name] is a 73 y.o. year old female who presents to our clinic with osteoporosis Femur Neck Left with a T-score of-2.6. She is currently taking caltrate one pill every other day and vitamin D  ? international units once daily.  Couldn't take fosamax  due to nausea, vomiting, head ache, diarrhea: was on higher dose on metformin  then but now is on low dose with no GI S/E  Risk Factors screening:  History of low trauma fractures: No Family history of osteoporosis: Yes, mom Hip fracture in first-degree relatives: No Smoking history: No Excessive alcohol intake >2 drinks/day: No Excessive caffeine intake >2 drinks/day: No Glucocorticoid use >5mg  prednisone /day for >3 months: No Rheumatoid arthritis history: No Premature/Surgical Menopause: Yes, early 19s   Anti-epileptic drugs No  Celiac disease/signs of malabsorption No  Gastric bypass/gastrectomy No  PPI use No  TZD use No  Gonadotropin/androgen suppressing drugs No  Aromatase Inhibitors No  Thyroid  hormone suppressive therapy No  DUAL X-RAY ABSORPTIOMETRY (DXA) FOR BONE MINERAL DENSITY   IMPRESSION: Donna [name] [facility]   Your patient Donna [name] completed a BMD test on 10/16/2022 using the Lunar IDXA DXA System (analysis version: 16.SP2) manufactured by [employer]. The following summarizes the results of our evaluation. SF   PATIENT: Name: Donna [name], Donna [name] Patient ID:  [id number] Birth Date: May 03, 1950 Height:     59.0 in. Gender:      Female    Measured:   10/16/2022 Weight:     195.6 lbs. Indications:           Fractures:             Treatments: Vitamin  D   ASSESSMENT: The BMD measured at Femur Neck Left is 0.682 g/cm2 with a T-score of -2.6. This patient's diagnostic category is OSTEOPOROSIS according to World Health Organization [facility]) criteria. L-4 was excluded due to degenerative changes. The scan quality is good.   Site Region Measured Date Measured Age WHO YA  BMD Classification T-score AP Spine L1-L3 10/16/2020 74.1 Normal -0.7 1.080 g/cm2   DualFemur Neck Left 10/16/2020 75.1 years Osteoporosis -2.6 0.682 g/cm2   Left Forearm Radius 33% 10/16/2022 75.1 Normal 0.3 0.902 g/cm2   World Health Organization [facility]) criteria for post-menopausal, Caucasian Women: Normal        T-score at or above -1 SD Low Bone Mass T-score between -1 and -2.5 SD Osteoporosis  T-score at or below -2.5 SD   RECOMMENDATION: 1. All patients should optimize calcium  and vitamin D  intake. 2. Consider FDA-approved medical therapies in postmenopausal women and men aged 18 years and older, based on the following: a. A hip or vertebral(clinical or morphometric) fracture. b. T-Score < -2.5 at the femoral neck or spine after appropriate evaluation to exclude secondary causes c. Low bone mass (T-score between -1.0 and -2.5 at the femoral neck or spine) and a 10 year probability of a hip fracture >3% or a 10 year probability of major osteoporosis-related fracture > 20% based on the [place] -adapted WHO algorithm d. Clinical judgement and/or patient preferences may indicate treatment for people with 10-year fracture probabilities above or below these levels   FOLLOW-UP: Patients with diagnosis of osteoporosis or at high risk for fracture should have regular bone mineral density tests. For patients eligible for Medicare, routine testing is allowed once every 2 years. The testing frequency can be increased to one year for patients who have rapidly progressing disease, those who are receiving or discontinuing medical therapy to restore bone mass, or have additional risk factors.    Physical Exam  BP 130/80   Pulse 85   Ht 5' 2 (1.575 m)   Wt 189 lb (85.7 kg)   SpO2 98%   BMI 34.57 kg/m  Constitutional: well developed, well nourished Head: normocephalic, atraumatic Eyes: sclera anicteric, no redness Neck: supple Lungs: normal respiratory effort Neurology: alert and  oriented Skin: dry, no appreciable rashes Musculoskeletal: no appreciable defects Psychiatric: normal mood and affect  Allergies Allergies  Allergen Reactions   Alendronate  Sodium Nausea Only    nausea   Penicillins     Unsure of the reaction- happened around age 25    Current Medications Patient's Medications  New Prescriptions   No medications on file  Previous Medications   ALENDRONATE  (FOSAMAX ) 70 MG TABLET    Take 1 tablet (70 mg total) by mouth every 7 (seven) days. Take with a full glass of water on an empty stomach.   AMLODIPINE  (NORVASC ) 10 MG TABLET    Take 1 tablet (10 mg total) by mouth daily.   ASPIRIN EC 81 MG TABLET    Take 81 mg by mouth every other day.   CHLORTHALIDONE  (HYGROTON ) 25 MG TABLET    TAKE 1 TABLET (25 MG TOTAL) BY MOUTH DAILY.   CHOLECALCIFEROL (VITAMIN D ) 50 MCG (2000 UT) CAPS    Take 2,000 Units by mouth daily.   METFORMIN  (GLUCOPHAGE -XR) 500 MG 24 HR TABLET    Take 1 tablet (500 mg total) by mouth at bedtime.   MULTIPLE VITAMIN (MULTIVITAMIN WITH MINERALS) TABS TABLET    Take 1 tablet by mouth daily. One-A-Day 65+ Proactive   POTASSIUM CHLORIDE  SA (KLOR-CON   M) 20 MEQ TABLET    Take 1 tablet (20 mEq total) by mouth daily.   ROSUVASTATIN  (CRESTOR ) 20 MG TABLET    Take 1 tablet (20 mg total) by mouth daily.   SPIRONOLACTONE  (ALDACTONE ) 25 MG TABLET    TAKE 1 TABLET BY MOUTH DAILY  Modified Medications   No medications on file  Discontinued Medications   No medications on file     Past Medical History Past Medical History:  Diagnosis Date   Essential hypertension 05/22/2017   Heart murmur    Osteoporosis    Thyroid  disease     Past Surgical History Past Surgical History:  Procedure Laterality Date   CHOLECYSTECTOMY N/A 05/18/2017   Procedure: LAPAROSCOPIC CHOLECYSTECTOMY WITH INTRAOPERATIVE CHOLANGIOGRAM;  Surgeon: Curvin Deward MOULD, MD;  Location: [facility];  Service: General;  Laterality: N/A;   ENDOSCOPIC RETROGRADE CHOLANGIOPANCREATOGRAPHY  (ERCP) WITH PROPOFOL  N/A 05/21/2017   Procedure: ENDOSCOPIC RETROGRADE CHOLANGIOPANCREATOGRAPHY (ERCP) WITH PROPOFOL ;  Surgeon: Rosalie Kitchens, MD;  Location: [facility];  Service: Endoscopy;  Laterality: N/A;   SMALL INTESTINE SURGERY      Family History family history includes Cancer in her father.  Social History Social History   Socioeconomic History   Marital status: Married    Spouse name: Not on file   Number of children: Not on file   Years of education: Not on file   Highest education level: Not on file  Occupational History   Not on file  Tobacco Use   Smoking status: Never   Smokeless tobacco: Never  Vaping Use   Vaping status: Never Used  Substance and Sexual Activity   Alcohol use: Never   Drug use: Never   Sexual activity: Yes  Other Topics Concern   Not on file  Social History Narrative   Not on file   Social Drivers of Health   Financial Resource Strain: Low Risk  (03/24/2022)   Overall Financial Resource Strain (CARDIA)    Difficulty of Paying Living Expenses: Not hard at all  Food Insecurity: No Food Insecurity (03/24/2022)   Hunger Vital Sign    Worried About Running Out of Food in the Last Year: Never true    Ran Out of Food in the Last Year: Never true  Transportation Needs: No Transportation Needs (03/24/2022)   PRAPARE - Administrator, Civil Service (Medical): No    Lack of Transportation (Non-Medical): No  Physical Activity: Inactive (03/24/2022)   Exercise Vital Sign    Days of Exercise per Week: 0 days    Minutes of Exercise per Session: 0 min  Stress: No Stress Concern Present (03/24/2022)   [employer] of Occupational Health - Occupational Stress Questionnaire    Feeling of Stress : Only a little  Social Connections: Socially Integrated (03/22/2021)   Social Connection and Isolation Panel    Frequency of Communication with Friends and Family: Twice a week    Frequency of Social Gatherings with Friends and Family: Twice a  week    Attends Religious Services: More than 4 times per year    Active Member of [employer] or Organizations: Yes    Attends Banker Meetings: Never    Marital Status: Married  Catering manager Violence: Not At Risk (03/22/2021)   Humiliation, Afraid, Rape, and Kick questionnaire    Fear of Current or Ex-Partner: No    Emotionally Abused: No    Physically Abused: No    Sexually Abused: No    Laboratory Investigations No components found  for: CMP No components found for: BMP Lab Results  Component Value Date   GFR 66.54 02/13/2023   Lab Results  Component Value Date   CREATININE 0.86 02/13/2023   No results found for: CBC No components found for: LFT No components found for: VITD No results found for: PTH  Lab Results  Component Value Date   TSH 5.26 07/19/2022    No components found for: RENAL FUNCTION No components found for: MAGNESIUM  Parts of this note may have been dictated using voice recognition software. There may be variances in spelling and vocabulary which are unintentional. Not all errors are proofread. Please notify the dino if any discrepancies are noted or if the meaning of any statement is not clear.

## 2023-07-27 NOTE — Telephone Encounter (Signed)
 Copied from CRM [id number]. Topic: Clinical - Medication Refill >> Jul 27, 2023 12:51 PM Chiquita SQUIBB wrote: Medication: amLODipine  amLODipine  (NORVASC ) 10 MG tablet  Patient wanted it noted that she has been taking care of her husband that is why she has been unable to come in. Patient is currently scheduled for the first available, that works for her.   Has the patient contacted their pharmacy? Yes- patient had to schedule an appointment first  (Agent: If no, request that the patient contact the pharmacy for the refill. If patient does not wish to contact the pharmacy document the reason why and proceed with request.) (Agent: If yes, when and what did the pharmacy advise?)  This is the patient's preferred pharmacy:    [id number] [place], [place] - [address] [place] [address] Phone: [phone] Fax: [phone]    Is this the correct pharmacy for this prescription? Yes If no, delete pharmacy and type the correct one.   Has the prescription been filled recently? No  Is the patient out of the medication? No- Patient has a few left  Has the patient been seen for an appointment in the last year OR does the patient have an upcoming appointment? Yes  Can we respond through MyChart? Yes  Agent: Please be advised that Rx refills may take up to 3 business days. We ask that you follow-up with your pharmacy.

## 2023-07-28 LAB — VITAMIN D 25 HYDROXY (VIT D DEFICIENCY, FRACTURES): Vit D, 25-Hydroxy: 50 ng/mL (ref 30–100)

## 2023-07-28 LAB — RENAL FUNCTION PANEL
Albumin: 4.5 g/dL (ref 3.6–5.1)
BUN: 24 mg/dL (ref 7–25)
CO2: 29 mmol/L (ref 20–32)
Calcium: 9.6 mg/dL (ref 8.6–10.4)
Chloride: 99 mmol/L (ref 98–110)
Creat: 0.82 mg/dL (ref 0.60–1.00)
Glucose, Bld: 95 mg/dL (ref 65–99)
Phosphorus: 4.1 mg/dL (ref 2.1–4.3)
Potassium: 3.5 mmol/L (ref 3.5–5.3)
Sodium: 139 mmol/L (ref 135–146)

## 2023-08-02 ENCOUNTER — Encounter: Payer: Self-pay | Admitting: Family Medicine

## 2023-08-02 ENCOUNTER — Ambulatory Visit (INDEPENDENT_AMBULATORY_CARE_PROVIDER_SITE_OTHER): Admitting: Family Medicine

## 2023-08-02 VITALS — BP 124/68 | HR 79 | Temp 98.6°F | Ht 62.0 in | Wt 189.8 lb

## 2023-08-02 DIAGNOSIS — R195 Other fecal abnormalities: Secondary | ICD-10-CM

## 2023-08-02 DIAGNOSIS — E782 Mixed hyperlipidemia: Secondary | ICD-10-CM

## 2023-08-02 DIAGNOSIS — I10 Essential (primary) hypertension: Secondary | ICD-10-CM | POA: Diagnosis not present

## 2023-08-02 DIAGNOSIS — E876 Hypokalemia: Secondary | ICD-10-CM

## 2023-08-02 DIAGNOSIS — R7303 Prediabetes: Secondary | ICD-10-CM | POA: Diagnosis not present

## 2023-08-02 MED ORDER — ROSUVASTATIN CALCIUM 20 MG PO TABS: 20.0000 mg | ORAL_TABLET | Freq: Every day | ORAL | 3 refills | Status: AC

## 2023-08-02 MED ORDER — POTASSIUM CHLORIDE CRYS ER 20 MEQ PO TBCR: 20.0000 meq | EXTENDED_RELEASE_TABLET | Freq: Every day | ORAL | 1 refills | Status: DC

## 2023-08-02 MED ORDER — POTASSIUM CHLORIDE CRYS ER 20 MEQ PO TBCR: 20.0000 meq | EXTENDED_RELEASE_TABLET | Freq: Every day | ORAL | 1 refills | Status: AC

## 2023-08-02 MED ORDER — CHLORTHALIDONE 25 MG PO TABS: 25.0000 mg | ORAL_TABLET | Freq: Every day | ORAL | 1 refills | Status: DC

## 2023-08-02 MED ORDER — AMLODIPINE BESYLATE 10 MG PO TABS: 10.0000 mg | ORAL_TABLET | Freq: Every day | ORAL | 1 refills | Status: DC

## 2023-08-02 MED ORDER — SPIRONOLACTONE 25 MG PO TABS: 25.0000 mg | ORAL_TABLET | Freq: Every day | ORAL | 1 refills | Status: DC

## 2023-08-02 NOTE — Addendum Note (Signed)
 Addended by: ALESSANDRA DEDRA PARAS on: 08/02/2023 04:15 PM   Modules accepted: Orders

## 2023-08-02 NOTE — Progress Notes (Signed)
 Established Patient Office Visit   Subjective:  Patient ID: Donna [name], female    DOB: 1950-10-24  Age: 73 y.o. MRN: [id number]  Chief Complaint  Patient presents with   Medical Management of Chronic Issues    Follow up. Pt is not fasting.     HPI Encounter Diagnoses  Name Primary?   Positive colorectal cancer screening using Cologuard test Yes   Essential hypertension    Hypokalemia    Prediabetes    Mixed hyperlipidemia    For follow-up of above.  She is accompanied by her husband.  He is currently battling prostate cancer.  She was unable to go for the consultation for colonoscopy regarding her positive Cologuard test.  She continues on medications as directed.  She is tolerating the 250 mg of metformin .   Review of Systems  Constitutional: Negative.   HENT: Negative.    Eyes:  Negative for blurred vision, discharge and redness.  Respiratory: Negative.    Cardiovascular: Negative.   Gastrointestinal:  Negative for abdominal pain.  Genitourinary: Negative.   Musculoskeletal: Negative.  Negative for myalgias.  Skin:  Negative for rash.  Neurological:  Negative for tingling, loss of consciousness and weakness.  Endo/Heme/Allergies:  Negative for polydipsia.     Current Outpatient Medications:    alendronate  (FOSAMAX ) 70 MG tablet, Take 1 tablet (70 mg total) by mouth every 7 (seven) days. Take with a full glass of water on an empty stomach., Disp: 4 tablet, Rfl: 11   amLODipine  (NORVASC ) 10 MG tablet, Take 1 tablet (10 mg total) by mouth daily., Disp: 90 tablet, Rfl: 1   aspirin EC 81 MG tablet, Take 81 mg by mouth every other day., Disp: , Rfl:    chlorthalidone  (HYGROTON ) 25 MG tablet, Take 1 tablet (25 mg total) by mouth daily., Disp: 100 tablet, Rfl: 1   Cholecalciferol (VITAMIN D ) 50 MCG (2000 UT) CAPS, Take 2,000 Units by mouth daily., Disp: , Rfl:    metFORMIN  (GLUCOPHAGE -XR) 500 MG 24 hr tablet, Take 1 tablet (500 mg total) by mouth at bedtime., Disp: 90 tablet,  Rfl: 1   Multiple Vitamin (MULTIVITAMIN WITH MINERALS) TABS tablet, Take 1 tablet by mouth daily. One-A-Day 65+ Proactive, Disp: , Rfl:    potassium chloride  SA (KLOR-CON  M) 20 MEQ tablet, Take 1 tablet (20 mEq total) by mouth daily., Disp: 90 tablet, Rfl: 1   rosuvastatin  (CRESTOR ) 20 MG tablet, Take 1 tablet (20 mg total) by mouth daily., Disp: 90 tablet, Rfl: 3   spironolactone  (ALDACTONE ) 25 MG tablet, Take 1 tablet (25 mg total) by mouth daily., Disp: 90 tablet, Rfl: 1   Objective:     BP 124/68 (Cuff Size: Normal)   Pulse 79   Temp 98.6 F (37 C) (Temporal)   Ht 5' 2 (1.575 m)   Wt 189 lb 12.8 oz (86.1 kg)   SpO2 96%   BMI 34.71 kg/m    Physical Exam Constitutional:      General: She is not in acute distress.    Appearance: Normal appearance. She is not ill-appearing, toxic-appearing or diaphoretic.  HENT:     Head: Normocephalic and atraumatic.     Right Ear: External ear normal.     Left Ear: External ear normal.     Mouth/Throat:     Mouth: Mucous membranes are moist.     Pharynx: Oropharynx is clear. No oropharyngeal exudate or posterior oropharyngeal erythema.  Eyes:     General: No scleral icterus.  Right eye: No discharge.        Left eye: No discharge.     Extraocular Movements: Extraocular movements intact.     Conjunctiva/sclera: Conjunctivae normal.     Pupils: Pupils are equal, round, and reactive to light.  Cardiovascular:     Rate and Rhythm: Normal rate and regular rhythm.  Pulmonary:     Effort: Pulmonary effort is normal. No respiratory distress.     Breath sounds: Normal breath sounds.  Abdominal:     General: Bowel sounds are normal.  Musculoskeletal:     Cervical back: No rigidity or tenderness.  Skin:    General: Skin is warm and dry.  Neurological:     Mental Status: She is alert and oriented to person, place, and time.  Psychiatric:        Mood and Affect: Mood normal.        Behavior: Behavior normal.      No results found  for any visits on 08/02/23.    The 10-year ASCVD risk score (Arnett DK, et al., 2019) is: 17.9%    Assessment & Plan:   Positive colorectal cancer screening using Cologuard test -     Ambulatory referral to Gastroenterology  Essential hypertension -     amLODIPine  Besylate; Take 1 tablet (10 mg total) by mouth daily.  Dispense: 90 tablet; Refill: 1 -     Chlorthalidone ; Take 1 tablet (25 mg total) by mouth daily.  Dispense: 100 tablet; Refill: 1 -     Spironolactone ; Take 1 tablet (25 mg total) by mouth daily.  Dispense: 90 tablet; Refill: 1  Hypokalemia -     Potassium Chloride  Crys ER; Take 1 tablet (20 mEq total) by mouth daily.  Dispense: 90 tablet; Refill: 1  Prediabetes -     Hemoglobin A1c  Mixed hyperlipidemia -     Rosuvastatin  Calcium ; Take 1 tablet (20 mg total) by mouth daily.  Dispense: 90 tablet; Refill: 3    Return in about 3 months (around 11/02/2023).  Urgent referral for consultation for colonoscopy.  Understands that follow-up is important to recheck her potassium, as she is taking both Aldactone  and chlorthalidone .  Advised her to have the Shingrix vaccine.  Elsie Sim Lent, MD

## 2023-08-03 LAB — HEMOGLOBIN A1C
Hgb A1c MFr Bld: 6 % — ABNORMAL HIGH (ref <5.7)
Mean Plasma Glucose: 126 mg/dL
eAG (mmol/L): 7 mmol/L

## 2023-08-05 ENCOUNTER — Ambulatory Visit: Payer: Self-pay | Admitting: Family Medicine

## 2023-08-08 ENCOUNTER — Ambulatory Visit: Payer: Self-pay | Admitting: "Endocrinology

## 2023-08-13 ENCOUNTER — Encounter: Payer: Self-pay | Admitting: Family Medicine

## 2023-08-13 ENCOUNTER — Ambulatory Visit (INDEPENDENT_AMBULATORY_CARE_PROVIDER_SITE_OTHER)

## 2023-08-13 DIAGNOSIS — Z0000 Encounter for general adult medical examination without abnormal findings: Secondary | ICD-10-CM

## 2023-08-13 NOTE — Progress Notes (Signed)
 Subjective:   Donna [name] is a 73 y.o. who presents for a Medicare Wellness preventive visit.  As a reminder, Annual Wellness Visits don't include a physical exam, and some assessments may be limited, especially if this visit is performed virtually. We may recommend an in-person follow-up visit with your provider if needed.  Visit Complete: Virtual I connected with  Courtnee Rane on 08/13/23 by a audio enabled telemedicine application and verified that I am speaking with the correct person using two identifiers.  Patient Location: Home  Provider Location: Office/Clinic  I discussed the limitations of evaluation and management by telemedicine. The patient expressed understanding and agreed to proceed.  Vital Signs: Because this visit was a virtual/telehealth visit, some criteria may be missing or patient reported. Any vitals not documented were not able to be obtained and vitals that have been documented are patient reported.  VideoError- Librarian, academic were attempted between this provider and patient, however failed, due to patient having technical difficulties OR patient did not have access to video capability.  We continued and completed visit with audio only.   Persons Participating in Visit: Patient.  AWV Questionnaire: No: Patient Medicare AWV questionnaire was not completed prior to this visit.  Cardiac Risk Factors include: advanced age (>17men, >34 women);dyslipidemia;hypertension     Objective:    Today's Vitals   There is no height or weight on file to calculate BMI.     08/13/2023    2:23 PM 03/24/2022   10:44 AM 03/22/2021    3:55 PM 02/17/2020    1:34 PM 09/21/2019    8:49 PM 05/21/2017   12:00 AM 05/18/2017    1:28 AM  Advanced Directives  Does Patient Have a Medical Advance Directive? Yes Yes Yes No No No  No   Type of Estate agent of [place];Living will Healthcare Power of [place];Living will Healthcare Power  of [place];Living will      Copy of Healthcare Power of Attorney in Chart? No - copy requested No - copy requested No - copy requested      Would patient like information on creating a medical advance directive?    Yes (MAU/Ambulatory/Procedural Areas - Information given) No - Patient declined No - Patient declined       Data saved with a previous flowsheet row definition    Current Medications (verified) Outpatient Encounter Medications as of 08/13/2023  Medication Sig   alendronate  (FOSAMAX ) 70 MG tablet Take 1 tablet (70 mg total) by mouth every 7 (seven) days. Take with a full glass of water on an empty stomach.   amLODipine  (NORVASC ) 10 MG tablet Take 1 tablet (10 mg total) by mouth daily.   aspirin EC 81 MG tablet Take 81 mg by mouth every other day.   chlorthalidone  (HYGROTON ) 25 MG tablet Take 1 tablet (25 mg total) by mouth daily.   Cholecalciferol (VITAMIN D ) 50 MCG (2000 UT) CAPS Take 2,000 Units by mouth daily.   metFORMIN  (GLUCOPHAGE -XR) 500 MG 24 hr tablet Take 1 tablet (500 mg total) by mouth at bedtime.   Multiple Vitamin (MULTIVITAMIN WITH MINERALS) TABS tablet Take 1 tablet by mouth daily. One-A-Day 65+ Proactive   potassium chloride  SA (KLOR-CON  M) 20 MEQ tablet Take 1 tablet (20 mEq total) by mouth daily.   rosuvastatin  (CRESTOR ) 20 MG tablet Take 1 tablet (20 mg total) by mouth daily.   spironolactone  (ALDACTONE ) 25 MG tablet Take 1 tablet (25 mg total) by mouth daily.   No  facility-administered encounter medications on file as of 08/13/2023.    Allergies (verified) Alendronate  sodium and Penicillins   History: Past Medical History:  Diagnosis Date   Essential hypertension 05/22/2017   Heart murmur    Osteoporosis    Thyroid  disease    Past Surgical History:  Procedure Laterality Date   CHOLECYSTECTOMY N/A 05/18/2017   Procedure: LAPAROSCOPIC CHOLECYSTECTOMY WITH INTRAOPERATIVE CHOLANGIOGRAM;  Surgeon: Curvin Deward MOULD, MD;  Location: [facility];  Service: General;   Laterality: N/A;   ENDOSCOPIC RETROGRADE CHOLANGIOPANCREATOGRAPHY (ERCP) WITH PROPOFOL  N/A 05/21/2017   Procedure: ENDOSCOPIC RETROGRADE CHOLANGIOPANCREATOGRAPHY (ERCP) WITH PROPOFOL ;  Surgeon: Rosalie Kitchens, MD;  Location: [facility];  Service: Endoscopy;  Laterality: N/A;   SMALL INTESTINE SURGERY     Family History  Problem Relation Age of Onset   Cancer Father    Colon cancer Neg Hx    Colon polyps Neg Hx    Esophageal cancer Neg Hx    Rectal cancer Neg Hx    Stomach cancer Neg Hx    Social History   Socioeconomic History   Marital status: Married    Spouse name: Not on file   Number of children: Not on file   Years of education: Not on file   Highest education level: Not on file  Occupational History   Not on file  Tobacco Use   Smoking status: Never   Smokeless tobacco: Never  Vaping Use   Vaping status: Never Used  Substance and Sexual Activity   Alcohol use: Never   Drug use: Never   Sexual activity: Yes  Other Topics Concern   Not on file  Social History Narrative   Not on file   Social Drivers of Health   Financial Resource Strain: Low Risk  (08/13/2023)   Overall Financial Resource Strain (CARDIA)    Difficulty of Paying Living Expenses: Not hard at all  Food Insecurity: No Food Insecurity (08/13/2023)   Hunger Vital Sign    Worried About Running Out of Food in the Last Year: Never true    Ran Out of Food in the Last Year: Never true  Transportation Needs: No Transportation Needs (08/13/2023)   PRAPARE - Administrator, Civil Service (Medical): No    Lack of Transportation (Non-Medical): No  Physical Activity: Sufficiently Active (08/13/2023)   Exercise Vital Sign    Days of Exercise per Week: 2 days    Minutes of Exercise per Session: 90 min  Stress: Stress Concern Present (08/13/2023)   [employer] of Occupational Health - Occupational Stress Questionnaire    Feeling of Stress: To some extent  Social Connections: Moderately  Integrated (08/13/2023)   Social Connection and Isolation Panel    Frequency of Communication with Friends and Family: Three times a week    Frequency of Social Gatherings with Friends and Family: Not on file    Attends Religious Services: More than 4 times per year    Active Member of [employer] or Organizations: No    Attends Banker Meetings: Never    Marital Status: Married    Tobacco Counseling Counseling given: Not Answered    Clinical Intake:  Pre-visit preparation completed: Yes  Pain : No/denies pain     Nutritional Risks: None Diabetes: No  Lab Results  Component Value Date   HGBA1C 6.0 (H) 08/02/2023   HGBA1C 6.2 02/13/2023   HGBA1C 6.1 10/19/2022     How often do you need to have someone help you when you read instructions,  pamphlets, or other written materials from your doctor or pharmacy?: 1 - Never  Interpreter Needed?: No  Information entered by :: NAllen LPN   Activities of Daily Living     08/13/2023    2:18 PM  In your present state of health, do you have any difficulty performing the following activities:  Hearing? 0  Vision? 0  Difficulty concentrating or making decisions? 0  Walking or climbing stairs? 0  Dressing or bathing? 0  Doing errands, shopping? 0  Preparing Food and eating ? N  Using the Toilet? N  In the past six months, have you accidently leaked urine? N  Do you have problems with loss of bowel control? N  Managing your Medications? N  Managing your Finances? N  Housekeeping or managing your Housekeeping? N    Patient Care Team: Berneta Elsie Sayre, MD as PCP - General (Family Medicine)  I have updated your Care Teams any recent Medical Services you may have received from other providers in the past year.     Assessment:   This is a routine wellness examination for [place].  Hearing/Vision screen Hearing Screening - Comments:: Denies hearing issues Vision Screening - Comments:: Regular eye exams, [facility]   Goals Addressed             This Visit's Progress    Patient Stated       08/13/2023, wants to lose weight       Depression Screen     08/13/2023    2:24 PM 07/19/2022   10:44 AM 03/24/2022   10:45 AM 04/29/2021   12:59 PM 03/22/2021    3:56 PM 03/22/2021    3:54 PM 02/17/2020    1:37 PM  PHQ 2/9 Scores  PHQ - 2 Score 1 0 0 0 0 0 0  PHQ- 9 Score 1          Fall Risk     08/13/2023    2:23 PM 08/02/2023    3:06 PM 07/19/2022   10:44 AM 03/24/2022   10:45 AM 04/29/2021   12:59 PM  Fall Risk   Falls in the past year? 0 0 0 0 0  Number falls in past yr: 0 0 0 0 0  Injury with Fall? 0 0 0 0   Risk for fall due to : Medication side effect No Fall Risks No Fall Risks Medication side effect   Follow up Falls evaluation completed;Falls prevention discussed Falls evaluation completed Falls evaluation completed Falls prevention discussed;Education provided;Falls evaluation completed     MEDICARE RISK AT HOME:  Medicare Risk at Home Any stairs in or around the home?: Yes If so, are there any without handrails?: No Home free of loose throw rugs in walkways, pet beds, electrical cords, etc?: Yes Adequate lighting in your home to reduce risk of falls?: Yes Life alert?: No Use of a cane, walker or w/c?: No Grab bars in the bathroom?: Yes Shower chair or bench in shower?: Yes Elevated toilet seat or a handicapped toilet?: No  TIMED UP AND GO:  Was the test performed?  No  Cognitive Function: 6CIT completed        08/13/2023    2:27 PM 03/24/2022   10:46 AM  6CIT Screen  What Year? 0 points 0 points  What month? 0 points 0 points  What time? 0 points 0 points  Count back from 20 0 points 0 points  Months in reverse 0 points 0 points  Repeat phrase  0 points 0 points  Total Score 0 points 0 points    Immunizations Immunization History  Administered Date(s) Administered   Influenza, Quadrivalent, Recombinant, Inj, Pf 11/10/2017   Influenza-Unspecified 12/20/2019    PFIZER(Purple Top)SARS-COV-2 Vaccination 11/17/2019, 12/20/2019   Pneumococcal Conjugate-13 07/22/2014   Pneumococcal Polysaccharide-23 01/12/2016    Screening Tests Health Maintenance  Topic Date Due   Zoster Vaccines- Shingrix (1 of 2) Never done   MAMMOGRAM  12/08/2014   Medicare Annual Wellness (AWV)  08/12/2024   Colonoscopy  02/10/2033   Pneumococcal Vaccine: 50+ Years  Completed   DEXA SCAN  Completed   Hepatitis B Vaccines  Aged Out   HPV VACCINES  Aged Out   Meningococcal B Vaccine  Aged Out   DTaP/Tdap/Td  Discontinued   INFLUENZA VACCINE  Discontinued   COVID-19 Vaccine  Discontinued   Hepatitis C Screening  Discontinued   Fecal DNA (Cologuard)  Discontinued    Health Maintenance  Health Maintenance Due  Topic Date Due   Zoster Vaccines- Shingrix (1 of 2) Never done   MAMMOGRAM  12/08/2014   Health Maintenance Items Addressed: Declines mammogram, Due for shingles. Due to have colonoscopy soon.  Additional Screening:  Vision Screening: Recommended annual ophthalmology exams for early detection of glaucoma and other disorders of the eye. Would you like a referral to an eye doctor? No    Dental Screening: Recommended annual dental exams for proper oral hygiene  Community Resource Referral / Chronic Care Management: CRR required this visit?  No   CCM required this visit?  No   Plan:    I have personally reviewed and noted the following in the patient's chart:   Medical and social history Use of alcohol, tobacco or illicit drugs  Current medications and supplements including opioid prescriptions. Patient is not currently taking opioid prescriptions. Functional ability and status Nutritional status Physical activity Advanced directives List of other physicians Hospitalizations, surgeries, and ER visits in previous 12 months Vitals Screenings to include cognitive, depression, and falls Referrals and appointments  In addition, I have reviewed and  discussed with patient certain preventive protocols, quality metrics, and best practice recommendations. A written personalized care plan for preventive services as well as general preventive health recommendations were provided to patient.   Ardella FORBES Dawn, LPN   [id number]   After Visit Summary: (MyChart) Due to this being a telephonic visit, the after visit summary with patients personalized plan was offered to patient via MyChart   Notes: Nothing significant to report at this time.

## 2023-08-13 NOTE — Patient Instructions (Signed)
 Donna [name] , Thank you for taking time out of your busy schedule to complete your Annual Wellness Visit with me. I enjoyed our conversation and look forward to speaking with you again next year. I, as well as your care team,  appreciate your ongoing commitment to your health goals. Please review the following plan we discussed and let me know if I can assist you in the future. Your Game plan/ To Do List    Referrals: If you haven't heard from the office you've been referred to, please reach out to them at the phone provided.  N/a Follow up Visits: Next Medicare AWV with our clinical staff: 08/15/2024 at 2:20   Have you seen your provider in the last 6 months (3 months if uncontrolled diabetes)? Yes Next Office Visit with your provider: 11/02/2023 at 10:20  Clinician Recommendations:  Aim for 30 minutes of exercise or brisk walking, 6-8 glasses of water, and 5 servings of fruits and vegetables each day.       This is a list of the screening recommended for you and due dates:  Health Maintenance  Topic Date Due   Zoster (Shingles) Vaccine (1 of 2) Never done   Mammogram  12/08/2014   Medicare Annual Wellness Visit  08/12/2024   Colon Cancer Screening  02/10/2033   Pneumococcal Vaccine for age over 71  Completed   DEXA scan (bone density measurement)  Completed   Hepatitis B Vaccine  Aged Out   HPV Vaccine  Aged Out   Meningitis B Vaccine  Aged Out   DTaP/Tdap/Td vaccine  Discontinued   Flu Shot  Discontinued   COVID-19 Vaccine  Discontinued   Hepatitis C Screening  Discontinued   Cologuard (Stool DNA test)  Discontinued    Advanced directives: (Copy Requested) Please bring a copy of your health care power of attorney and living will to the office to be added to your chart at your convenience. You can mail to [facility] [address] [place], [place] [address] or email to [email or web address] Advance Care Planning is important because it:  [x]  Makes sure you  receive the medical care that is consistent with your values, goals, and preferences  [x]  It provides guidance to your family and loved ones and reduces their decisional burden about whether or not they are making the right decisions based on your wishes.  Follow the link provided in your after visit summary or read over the paperwork we have mailed to you to help you started getting your Advance Directives in place. If you need assistance in completing these, please reach out to us  so that we can help you!  See attachments for Preventive Care and Fall Prevention Tips.

## 2023-09-19 ENCOUNTER — Other Ambulatory Visit: Payer: Self-pay | Admitting: Family Medicine

## 2023-09-19 DIAGNOSIS — M810 Age-related osteoporosis without current pathological fracture: Secondary | ICD-10-CM

## 2023-10-29 ENCOUNTER — Encounter: Payer: Self-pay | Admitting: Internal Medicine

## 2023-10-29 ENCOUNTER — Telehealth (INDEPENDENT_AMBULATORY_CARE_PROVIDER_SITE_OTHER): Admitting: "Endocrinology

## 2023-10-29 ENCOUNTER — Encounter: Payer: Self-pay | Admitting: "Endocrinology

## 2023-10-29 VITALS — Ht 62.0 in | Wt 189.0 lb

## 2023-10-29 DIAGNOSIS — M810 Age-related osteoporosis without current pathological fracture: Secondary | ICD-10-CM

## 2023-10-29 NOTE — Progress Notes (Signed)
 The patient reports they are currently: [place]. I spent 6-7 minutes on the video with the patient on the date of service. I spent an additional 2 minutes on pre- and post-visit activities on the date of service.   The patient was physically located in [place]  or a state in which I am permitted to provide care. The patient and/or parent/guardian understood that s/he may incur co-pays and cost sharing, and agreed to the telemedicine visit. The visit was reasonable and appropriate under the circumstances given the patient's presentation at the time.  The patient and/or parent/guardian has been advised of the potential risks and limitations of this mode of treatment (including, but not limited to, the absence of in-person examination) and has agreed to be treated using telemedicine. The patient's/patient's family's questions regarding telemedicine have been answered.   The patient and/or parent/guardian has also been advised to contact their provider's office for worsening conditions, and seek emergency medical treatment and/or call 911 if the patient deems either necessary.      [facility] Note Donna Birmingham, MD    Referring Provider: Berneta Donna [name],* Primary Care Provider: Berneta Donna Sayre, MD No chief complaint on file.   Assessment & Plan  Diagnoses and all orders for this visit:  Age-related osteoporosis without current pathological fracture -     VITAMIN D  25 Hydroxy (Vit-D Deficiency, Fractures) -     Renal function panel    Osteoporosis Likely secondary cause from age. 10/2022 BMD results suggest: Femur Neck Left with a T-score of -2.6. Recommend to use calcium  600 mg twice daily and vitamin D  2000 units OTC supplements. Discussed weight bearing exercise options and dietary supplements.   Couldn't take fosamax  due to nausea, vomiting, head ache, diarrhea: was on higher dose on metformin  then but now is on low dose with no GI S/E. Recommend labs  now, if within normal limits will recommend to retry fosamax  per patient agreement, if not tolerated will switch to reclast.   Educated on risks and side effects of fosamax  including but not limited to esophagitis/GERD, atypical femoral fractures and osteonecrosis of the jaw. Patient has fosamax  at home. Advised to take medication first thing in the morning with plenty of water and stay upright for 30 minutes after taking the medication.  Advised fall precautions, adequate dairy in diet and exercises (aerobic, balancing and weight bearing) as tolerated.   Return in about 1 year (around 10/28/2024) for visit + labs before next visit.  I have reviewed current medications, nurse's notes, allergies, vital signs, past medical and surgical history, family medical history, and social history for this encounter. Counseled patient on symptoms, examination findings, lab findings, imaging results, treatment decisions and monitoring and prognosis. The patient understood the recommendations and agrees with the treatment plan. All questions regarding treatment plan were fully answered.   Donna Birmingham, MD   10/29/23   History of Present Illness Donna [name] is a 73 y.o. year old female who presents to our clinic with osteoporosis Femur Neck Left with a T-score of-2.6. She is currently taking caltrate one pill every other day and a MVI.  Initial couldn't take fosamax  due to nausea, vomiting, head ache, diarrhea: was on higher dose on metformin  then but now is on low dose with no GI S/E Currently tolerating fosamax  without issues  Next DXA around 10/2024 Takes Caltrate every day and a MVI   No falls/fractures Went to dentist recently  Risk Factors screening:  History of low trauma fractures: No  Family history of osteoporosis: Yes, mom Hip fracture in first-degree relatives: No Smoking history: No Excessive alcohol intake >2 drinks/day: No Excessive caffeine intake >2 drinks/day: No Glucocorticoid use  >5mg  prednisone /day for >3 months: No Rheumatoid arthritis history: No Premature/Surgical Menopause: Yes, early 47s   Anti-epileptic drugs No  Celiac disease/signs of malabsorption No  Gastric bypass/gastrectomy No  PPI use No  TZD use No  Gonadotropin/androgen suppressing drugs No  Aromatase Inhibitors No  Thyroid  hormone suppressive therapy No  DUAL X-RAY ABSORPTIOMETRY (DXA) FOR BONE MINERAL DENSITY   IMPRESSION: Donna [name] Donna [name]   Your patient Donna [name] completed a BMD test on 10/16/2022 using the Lunar IDXA DXA System (analysis version: 16.SP2) manufactured by [employer]. The following summarizes the results of our evaluation. SF   PATIENT: Name: Donna [name], Donna [name] Patient ID:  [id number] Birth Date: 1950-07-20 Height:     59.0 in. Gender:      Female    Measured:   10/16/2022 Weight:     195.6 lbs. Indications:           Fractures:             Treatments: Vitamin D    ASSESSMENT: The BMD measured at Femur Neck Left is 0.682 g/cm2 with a T-score of -2.6. This patient's diagnostic category is OSTEOPOROSIS according to [name] Donna [name]) criteria. L-4 was excluded due to degenerative changes. The scan quality is good.   Site Region Measured Date Measured Age WHO YA BMD Classification T-score AP Spine L1-L3 10/16/2020 74.1 Normal -0.7 1.080 g/cm2   DualFemur Neck Left 10[age]16/[age] 74.1 [age] Osteoporosis -2.6 0.682 g/cm2   Left Forearm Radius 33% 10/16/2020 74.1 Normal 0.3 0.902 g/cm2   [employer] [name] [facility]) criteria for post-menopausal, Caucasian Women: Normal        T-score at or above -1 SD Low Bone Mass T-score between -1 and -2.5 SD Osteoporosis  T-score at or below -2.5 SD   RECOMMENDATION: 1. All patients should optimize calcium  and vitamin D  intake. 2. Consider FDA-approved medical therapies in postmenopausal women and men aged 36 years and older, based on the following: a. A hip or vertebral(clinical or morphometric)  fracture. b. T-Score < -2.5 at the femoral neck or spine after appropriate evaluation to exclude secondary causes c. Low bone mass (T-score between -1.0 and -2.5 at the femoral neck or spine) and a 10 year probability of a hip fracture >3% or a 10 year probability of major osteoporosis-related fracture > 20% based on the [place] -adapted WHO algorithm d. Clinical judgement and/or patient preferences may indicate treatment for people with 10-year fracture probabilities above or below these levels   FOLLOW-UP: Patients with diagnosis of osteoporosis or at high risk for fracture should have regular bone mineral density tests. For patients eligible for Medicare, routine testing is allowed once every 2 years. The testing frequency can be increased to one year for patients who have rapidly progressing disease, those who are receiving or discontinuing medical therapy to restore bone mass, or have additional risk factors.    Physical Exam  Ht 5' 2 (1.575 m)   Wt 189 lb (85.7 kg)   BMI 34.57 kg/m  Constitutional: well developed, well nourished Head: normocephalic, atraumatic Eyes: sclera anicteric, no redness Neck: supple Lungs: normal respiratory effort Neurology: alert and oriented Skin: dry, no appreciable rashes Musculoskeletal: no appreciable defects Psychiatric: normal mood and affect  Allergies Allergies  Allergen Reactions   Alendronate  Sodium Nausea Only    nausea   Penicillins  Unsure of the reaction- happened around age 52    Current Medications Patient's Medications  New Prescriptions   No medications on file  Previous Medications   ALENDRONATE  (FOSAMAX ) 70 MG TABLET    TAKE 1 TABLET BY MOUTH ONCE WEEKLY ON AN EMPTY STOMACH BEFORE BREAKFAST. REMAIN UPRIGHT FOR 30 MINUTES AND TAKE WITH 8 OUNCES OF WATER   AMLODIPINE  (NORVASC ) 10 MG TABLET    Take 1 tablet (10 mg total) by mouth daily.   ASPIRIN EC 81 MG TABLET    Take 81 mg by mouth every other day.    CHLORTHALIDONE  (HYGROTON ) 25 MG TABLET    Take 1 tablet (25 mg total) by mouth daily.   CHOLECALCIFEROL (VITAMIN D ) 50 MCG (2000 UT) CAPS    Take 2,000 Units by mouth daily.   METFORMIN  (GLUCOPHAGE -XR) 500 MG 24 HR TABLET    Take 1 tablet (500 mg total) by mouth at bedtime.   MULTIPLE VITAMIN (MULTIVITAMIN WITH MINERALS) TABS TABLET    Take 1 tablet by mouth daily. One-A-Day 65+ Proactive   POTASSIUM CHLORIDE  SA (KLOR-CON  M) 20 MEQ TABLET    Take 1 tablet (20 mEq total) by mouth daily.   ROSUVASTATIN  (CRESTOR ) 20 MG TABLET    Take 1 tablet (20 mg total) by mouth daily.   SPIRONOLACTONE  (ALDACTONE ) 25 MG TABLET    Take 1 tablet (25 mg total) by mouth daily.  Modified Medications   No medications on file  Discontinued Medications   No medications on file     Past Medical History Past Medical History:  Diagnosis Date   Essential hypertension 05/22/2017   Heart murmur    Osteoporosis    Thyroid  disease     Past Surgical History Past Surgical History:  Procedure Laterality Date   CHOLECYSTECTOMY N/A 05/18/2017   Procedure: LAPAROSCOPIC CHOLECYSTECTOMY WITH INTRAOPERATIVE CHOLANGIOGRAM;  Surgeon: Curvin Deward MOULD, MD;  Location: [facility];  Service: General;  Laterality: N/A;   ENDOSCOPIC RETROGRADE CHOLANGIOPANCREATOGRAPHY (ERCP) WITH PROPOFOL  N/A 05/21/2017   Procedure: ENDOSCOPIC RETROGRADE CHOLANGIOPANCREATOGRAPHY (ERCP) WITH PROPOFOL ;  Surgeon: Rosalie Kitchens, MD;  Location: [facility];  Service: Endoscopy;  Laterality: N/A;   SMALL INTESTINE SURGERY      Family History family history includes Cancer in her father.  Social History Social History   Socioeconomic History   Marital status: Married    Spouse name: Not on file   Number of children: Not on file   Years of education: Not on file   Highest education level: Not on file  Occupational History   Not on file  Tobacco Use   Smoking status: Never   Smokeless tobacco: Never  Vaping Use   Vaping status: Never Used   Substance and Sexual Activity   Alcohol use: Never   Drug use: Never   Sexual activity: Yes  Other Topics Concern   Not on file  Social History Narrative   Not on file   Social Drivers of [name]   Financial Resource Strain: Low Risk  (08/13/2023)   Overall Financial Resource Strain (CARDIA)    Difficulty of Paying Living Expenses: Not hard at all  Food Insecurity: No Food Insecurity (08/13/2023)   Hunger Vital Sign    Worried About Running Out of Food in the Last Year: Never true    Ran Out of Food in the Last Year: Never true  Transportation Needs: No Transportation Needs (08/13/2023)   PRAPARE - Transportation    Lack of Transportation (Medical): No    Lack of  Transportation (Non-Medical): No  Physical Activity: Sufficiently Active (08/13/2023)   Exercise Vital Sign    Days of Exercise per Week: 2 days    Minutes of Exercise per Session: 90 min  Stress: Stress Concern Present (08/13/2023)   [employer] of Occupational [name] - Occupational Stress Questionnaire    Feeling of Stress: To some extent  Social Connections: Moderately Integrated (08/13/2023)   Social Connection and Isolation Panel    Frequency of Communication with Friends and Family: Three times a week    Frequency of Social Gatherings with Friends and Family: Not on file    Attends Religious Services: More than 4 times per year    Active Member of [employer] or Organizations: No    Attends Banker Meetings: Never    Marital Status: Married  Catering manager Violence: Not At Risk (08/13/2023)   Humiliation, Afraid, Rape, and Kick questionnaire    Fear of Current or Ex-Partner: No    Emotionally Abused: No    Physically Abused: No    Sexually Abused: No    Laboratory Investigations No components found for: CMP No components found for: BMP Lab Results  Component Value Date   GFR 66.54 02/13/2023   Lab Results  Component Value Date   CREATININE 0.82 07/27/2023   No results found for:  CBC No components found for: LFT No components found for: VITD No results found for: PTH  Lab Results  Component Value Date   TSH 5.26 07/19/2022    No components found for: RENAL FUNCTION No components found for: MAGNESIUM  Parts of this note may have been dictated using voice recognition software. There may be variances in spelling and vocabulary which are unintentional. Not all errors are proofread. Please notify the dino if any discrepancies are noted or if the meaning of any statement is not clear.

## 2023-11-02 ENCOUNTER — Ambulatory Visit: Admitting: Family Medicine

## 2023-11-07 ENCOUNTER — Encounter

## 2023-11-09 ENCOUNTER — Encounter

## 2023-11-12 ENCOUNTER — Encounter

## 2023-11-21 ENCOUNTER — Encounter: Admitting: Internal Medicine

## 2023-11-22 ENCOUNTER — Ambulatory Visit: Admitting: Family Medicine

## 2023-11-27 IMAGING — DX DG CHEST 2V
2 series · 2 of 2 positions shown · non-contrast
Comparison: None.

CLINICAL DATA: Cough

EXAM:
CHEST - 2 VIEW

[chest pa]
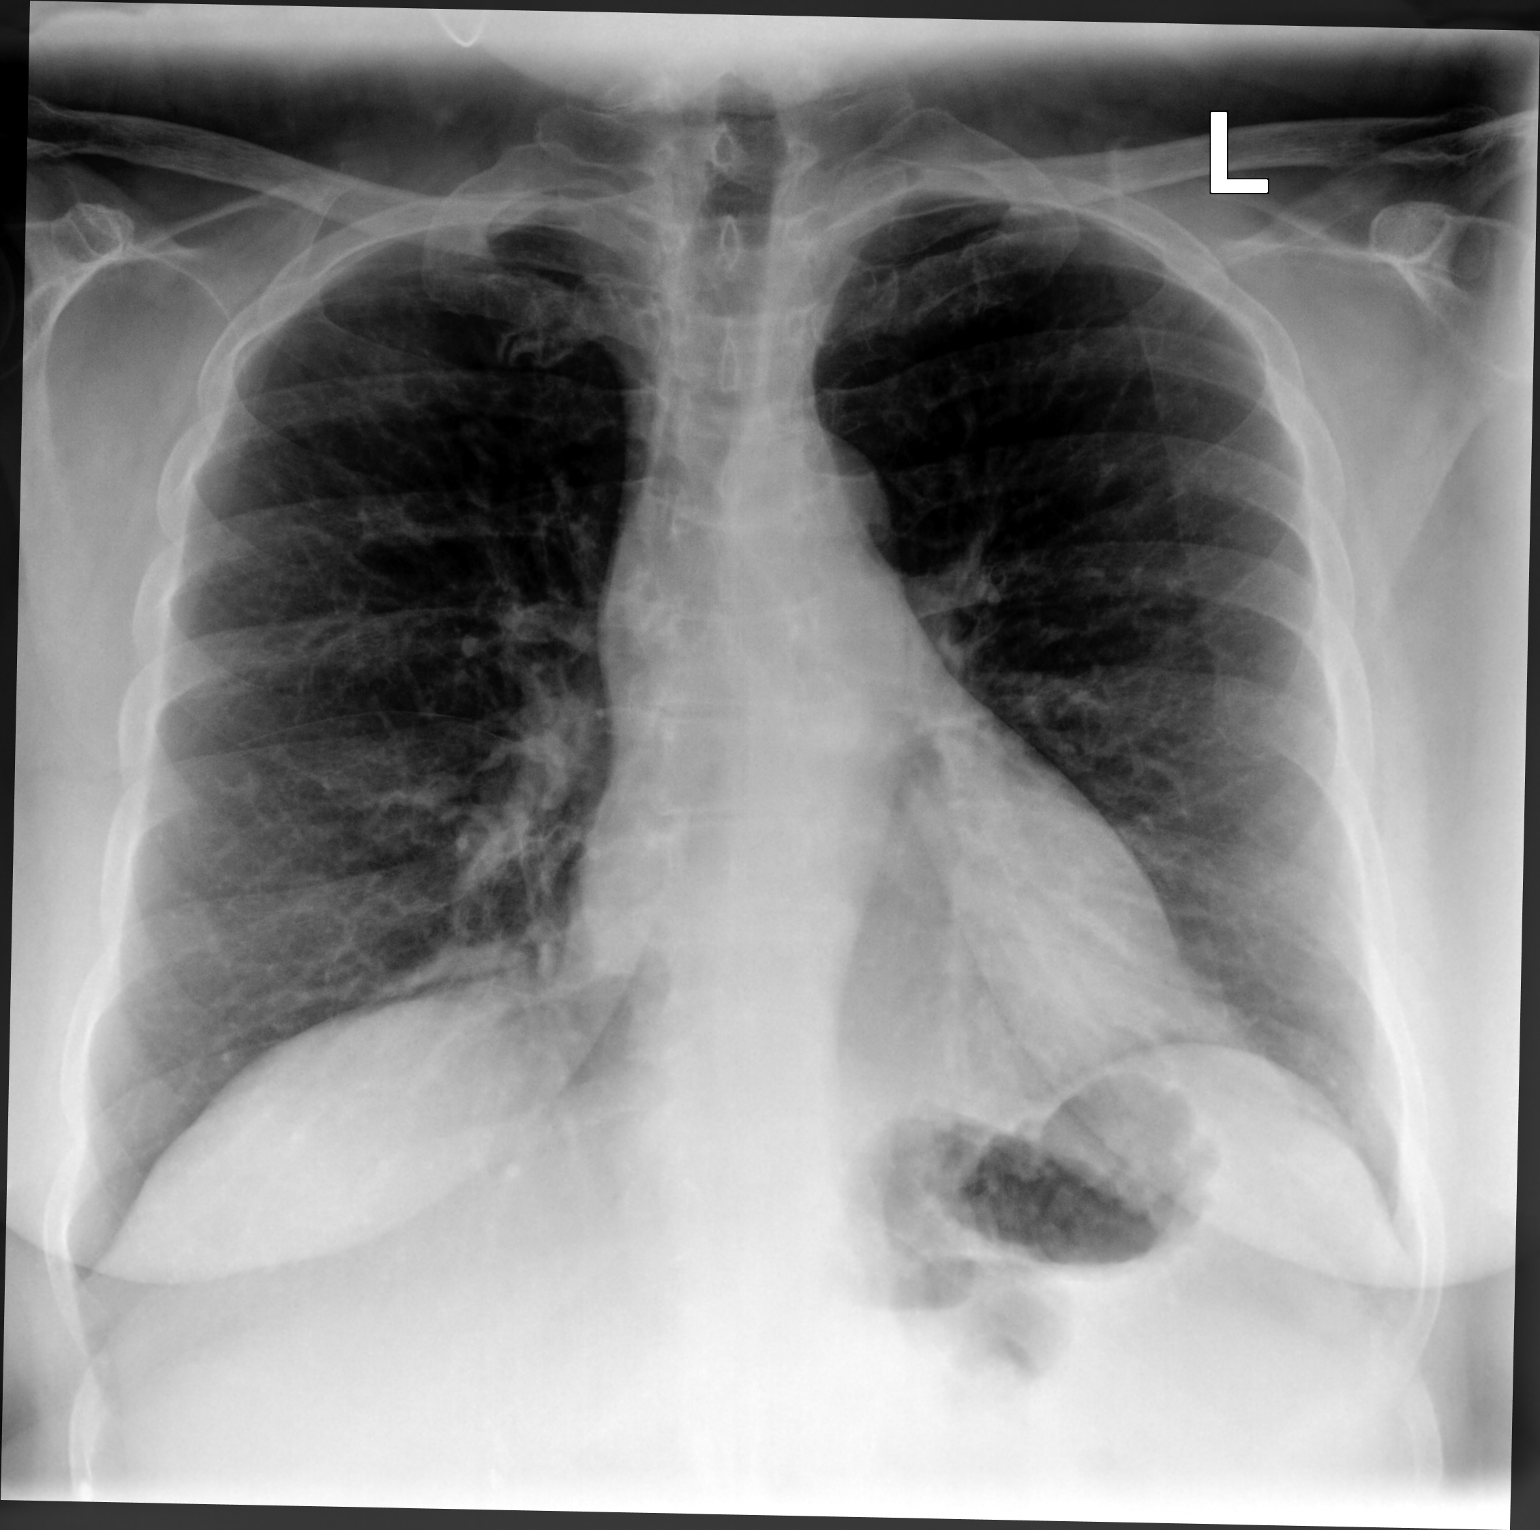

[chest lat]
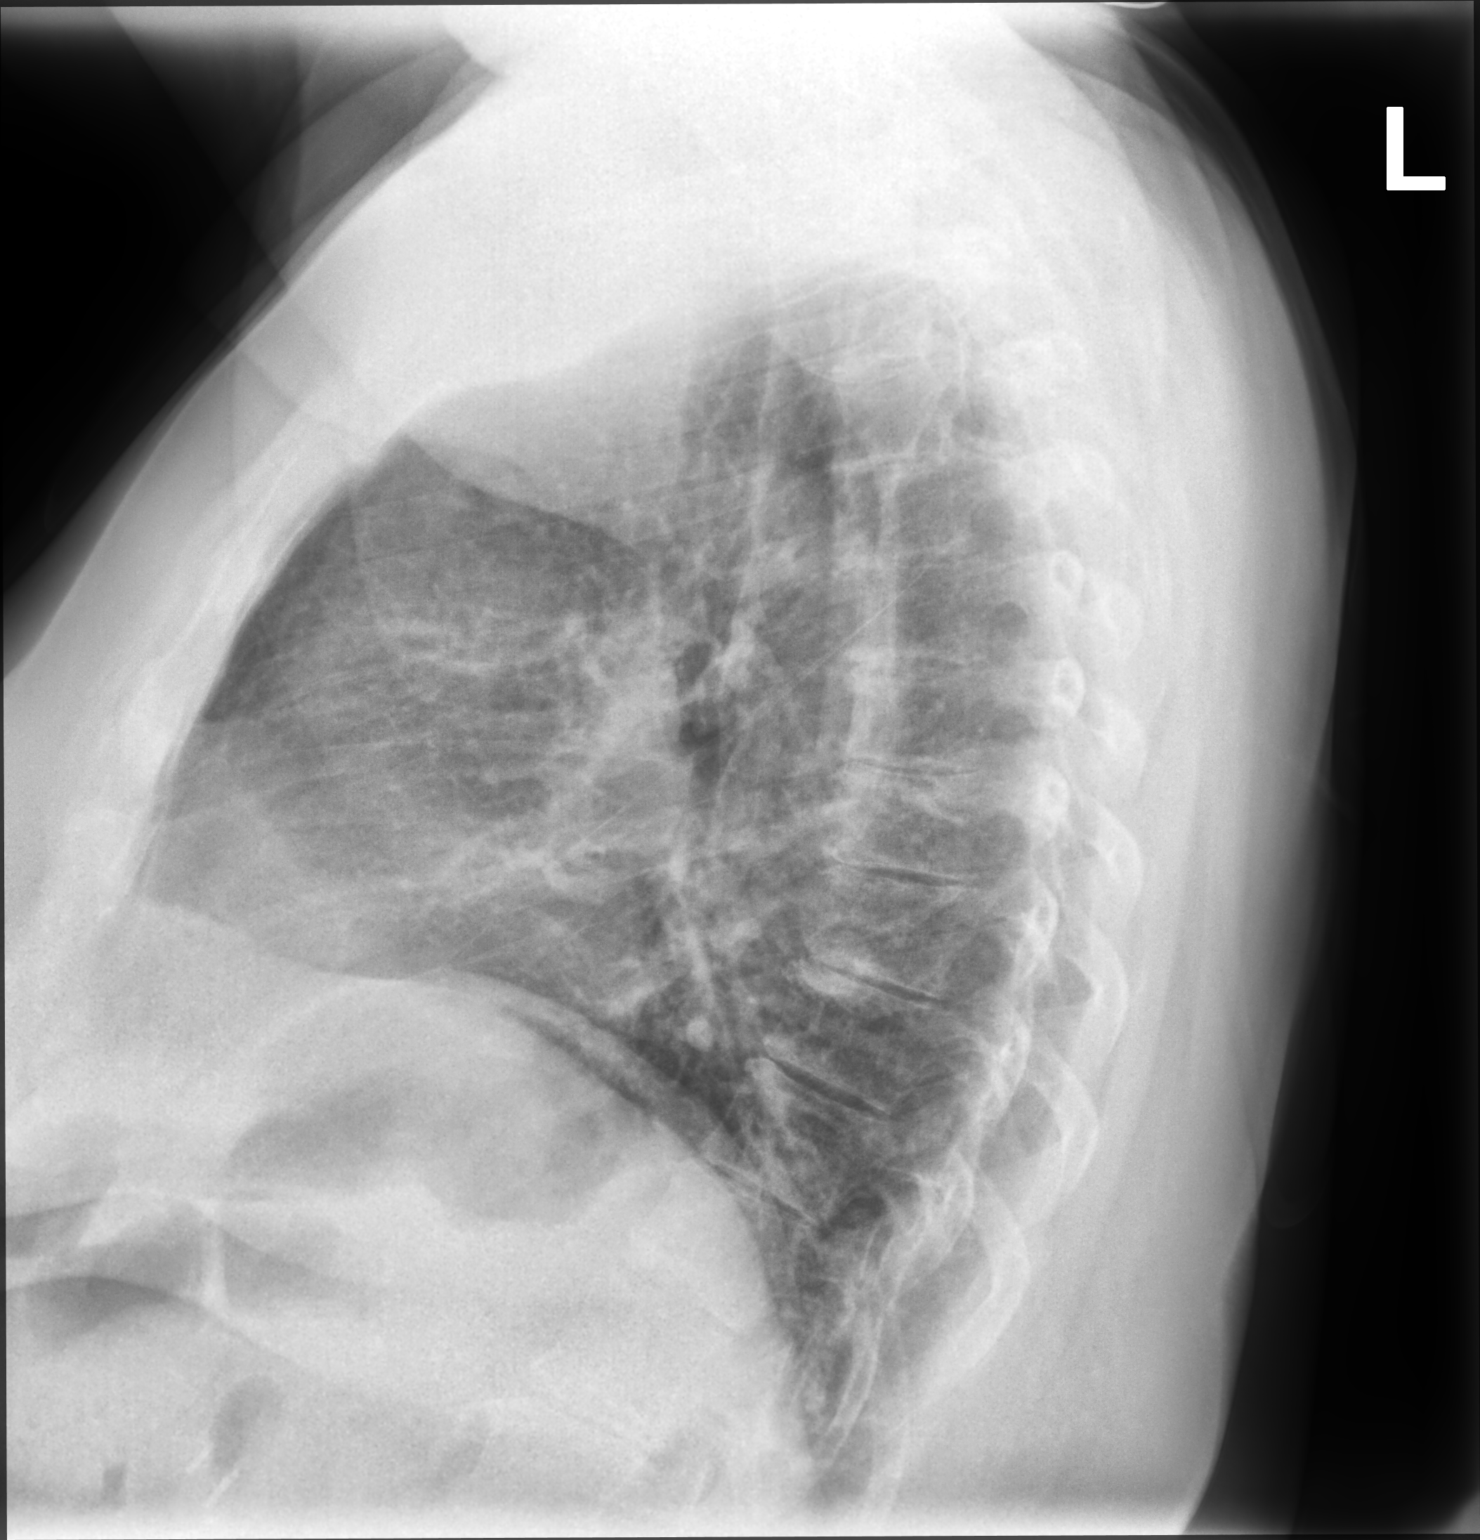

[2 of 2 positions shown; findings below may reference images not displayed]

FINDINGS: Lungs are well expanded, symmetric, and clear. No pneumothorax or
pleural effusion. Cardiac size within normal limits. Pulmonary
vascularity is normal. Osseous structures are age-appropriate. No
acute bone abnormality.
IMPRESSION: No active cardiopulmonary disease.

## 2023-12-06 ENCOUNTER — Ambulatory Visit

## 2023-12-06 VITALS — Ht 62.0 in | Wt 196.0 lb

## 2023-12-06 DIAGNOSIS — R195 Other fecal abnormalities: Secondary | ICD-10-CM

## 2023-12-06 MED ORDER — NA SULFATE-K SULFATE-MG SULF 17.5-3.13-1.6 GM/177ML PO SOLN: 1.0000 | Freq: Once | ORAL | 0 refills | Status: AC

## 2023-12-06 NOTE — Addendum Note (Signed)
 Addended by: FAUSTO TAWNI ORN on: 12/06/2023 05:00 PM   Modules accepted: Orders

## 2023-12-06 NOTE — Progress Notes (Signed)

## 2023-12-20 ENCOUNTER — Encounter: Admitting: Internal Medicine

## 2024-01-03 ENCOUNTER — Ambulatory Visit: Admitting: Family Medicine

## 2024-02-01 ENCOUNTER — Other Ambulatory Visit: Payer: Self-pay | Admitting: Family Medicine

## 2024-02-01 DIAGNOSIS — I10 Essential (primary) hypertension: Secondary | ICD-10-CM

## 2024-02-25 ENCOUNTER — Other Ambulatory Visit: Payer: Self-pay | Admitting: Family Medicine

## 2024-02-25 DIAGNOSIS — I10 Essential (primary) hypertension: Secondary | ICD-10-CM

## 2024-02-26 ENCOUNTER — Telehealth: Payer: Self-pay

## 2024-02-26 NOTE — Progress Notes (Signed)
 Pharmacy Quality Measure Review  This patient is appearing on a report for being at risk of failing the adherence measure for diabetes medications this calendar year.   Medication: Metformin  ER 500mg  Last fill date: 11/13/2023 for 90 day supply  Patient is currently nonadherent. Last supply should've ran out on 02/11/2024. Looks as if a refill needs to be sent to the pharmacy.    Kinzie Wickes Student-PharmD

## 2024-03-02 NOTE — Progress Notes (Signed)
" ° °  03/02/2024  Patient ID: Donna [name], female   DOB: [birth year], 74 y.o.   MRN: [id number]  Patient is establishing care with [facility] provider for primary care. "

## 2024-04-17 ENCOUNTER — Encounter: Admitting: Family Medicine

## 2024-08-15 ENCOUNTER — Ambulatory Visit
